# Patient Record
Sex: Male | Born: 1960 | Race: White | Hispanic: No | Marital: Married | Smoking: Former smoker
Health system: Southern US, Community
[De-identification: ages and names within clinical notes are randomized; demographics above are authoritative.]

## PROBLEM LIST (undated history)

## (undated) DIAGNOSIS — F32A Depression, unspecified: Secondary | ICD-10-CM

## (undated) DIAGNOSIS — G4733 Obstructive sleep apnea (adult) (pediatric): Secondary | ICD-10-CM

## (undated) DIAGNOSIS — Z8371 Family history of colonic polyps: Secondary | ICD-10-CM

## (undated) DIAGNOSIS — M1990 Unspecified osteoarthritis, unspecified site: Secondary | ICD-10-CM

## (undated) DIAGNOSIS — M25532 Pain in left wrist: Secondary | ICD-10-CM

## (undated) DIAGNOSIS — I491 Atrial premature depolarization: Secondary | ICD-10-CM

## (undated) DIAGNOSIS — M542 Cervicalgia: Secondary | ICD-10-CM

## (undated) DIAGNOSIS — R5381 Other malaise: Secondary | ICD-10-CM

## (undated) DIAGNOSIS — R768 Other specified abnormal immunological findings in serum: Secondary | ICD-10-CM

## (undated) DIAGNOSIS — F329 Major depressive disorder, single episode, unspecified: Secondary | ICD-10-CM

## (undated) DIAGNOSIS — R5383 Other fatigue: Secondary | ICD-10-CM

## (undated) DIAGNOSIS — J45909 Unspecified asthma, uncomplicated: Secondary | ICD-10-CM

## (undated) DIAGNOSIS — E785 Hyperlipidemia, unspecified: Secondary | ICD-10-CM

## (undated) DIAGNOSIS — F419 Anxiety disorder, unspecified: Secondary | ICD-10-CM

## (undated) DIAGNOSIS — K449 Diaphragmatic hernia without obstruction or gangrene: Secondary | ICD-10-CM

## (undated) DIAGNOSIS — J329 Chronic sinusitis, unspecified: Secondary | ICD-10-CM

## (undated) DIAGNOSIS — R739 Hyperglycemia, unspecified: Secondary | ICD-10-CM

## (undated) DIAGNOSIS — R079 Chest pain, unspecified: Secondary | ICD-10-CM

## (undated) DIAGNOSIS — Z83719 Family history of colon polyps, unspecified: Secondary | ICD-10-CM

## (undated) DIAGNOSIS — N400 Enlarged prostate without lower urinary tract symptoms: Secondary | ICD-10-CM

## (undated) DIAGNOSIS — Z87442 Personal history of urinary calculi: Secondary | ICD-10-CM

## (undated) DIAGNOSIS — G44229 Chronic tension-type headache, not intractable: Secondary | ICD-10-CM

## (undated) HISTORY — DX: Anxiety disorder, unspecified: F41.9

## (undated) HISTORY — DX: Atrial premature depolarization: I49.1

## (undated) HISTORY — DX: Cervicalgia: M54.2

## (undated) HISTORY — DX: Other fatigue: R53.83

## (undated) HISTORY — DX: Depression, unspecified: F32.A

## (undated) HISTORY — DX: Other specified abnormal immunological findings in serum: R76.8

## (undated) HISTORY — PX: OTHER SURGICAL HISTORY: SHX169

## (undated) HISTORY — DX: Family history of colon polyps, unspecified: Z83.719

## (undated) HISTORY — DX: Obstructive sleep apnea (adult) (pediatric): G47.33

## (undated) HISTORY — DX: Hyperlipidemia, unspecified: E78.5

## (undated) HISTORY — DX: Family history of colonic polyps: Z83.71

## (undated) HISTORY — DX: Major depressive disorder, single episode, unspecified: F32.9

## (undated) HISTORY — DX: Other malaise: R53.81

## (undated) HISTORY — DX: Pain in left wrist: M25.532

## (undated) HISTORY — DX: Benign prostatic hyperplasia without lower urinary tract symptoms: N40.0

## (undated) HISTORY — DX: Chest pain, unspecified: R07.9

## (undated) HISTORY — DX: Diaphragmatic hernia without obstruction or gangrene: K44.9

## (undated) HISTORY — DX: Hyperglycemia, unspecified: R73.9

## (undated) HISTORY — DX: Chronic tension-type headache, not intractable: G44.229

---

## 1982-03-23 DIAGNOSIS — R7689 Other specified abnormal immunological findings in serum: Secondary | ICD-10-CM | POA: Insufficient documentation

## 1982-03-23 DIAGNOSIS — R768 Other specified abnormal immunological findings in serum: Secondary | ICD-10-CM

## 1982-03-23 HISTORY — DX: Other specified abnormal immunological findings in serum: R76.8

## 1998-03-04 ENCOUNTER — Emergency Department (HOSPITAL_COMMUNITY): Admission: EM | Admit: 1998-03-04 | Discharge: 1998-03-04 | Payer: Self-pay | Admitting: Emergency Medicine

## 2008-09-17 HISTORY — PX: COLON SURGERY: SHX602

## 2008-09-17 LAB — HM COLONOSCOPY

## 2009-01-30 ENCOUNTER — Encounter: Admission: RE | Admit: 2009-01-30 | Discharge: 2009-01-30 | Payer: Self-pay | Admitting: Internal Medicine

## 2009-03-23 HISTORY — PX: WISDOM TOOTH EXTRACTION: SHX21

## 2010-04-02 ENCOUNTER — Emergency Department (HOSPITAL_BASED_OUTPATIENT_CLINIC_OR_DEPARTMENT_OTHER)
Admission: EM | Admit: 2010-04-02 | Discharge: 2010-04-02 | Payer: Self-pay | Source: Home / Self Care | Admitting: Emergency Medicine

## 2010-08-11 IMAGING — CT CT CERVICAL SPINE W/O CM
4 series · 16 of 33 positions shown, 19 images · non-contrast
Comparison: None

CLINICAL DATA: Cervical pain with rotation, flexion, and extension
since 11/21/2008.  No history of trauma, surgery, or cancer.

CT CERVICAL SPINE WITHOUT CONTRAST
TECHNIQUE: Multidetector CT imaging of the cervical spine was
performed. Multiplanar CT image reconstructions were also
generated.

[Series 3: cspine · axial · 0.34mm/px · z∈[-646,-478]mm · 5 of 126 slices shown, 7 images]
[im 21/126  soft-tissue]
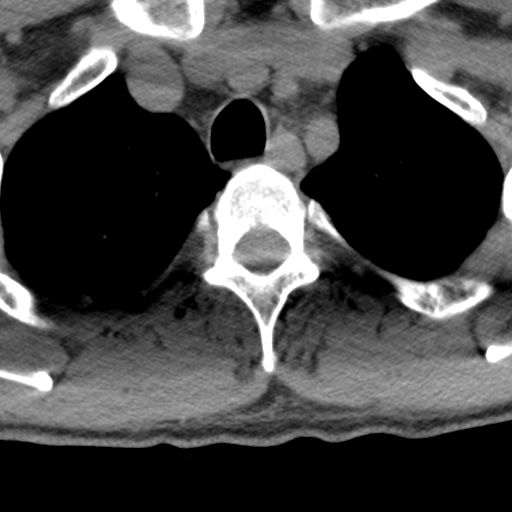
[im 21/126  bone]
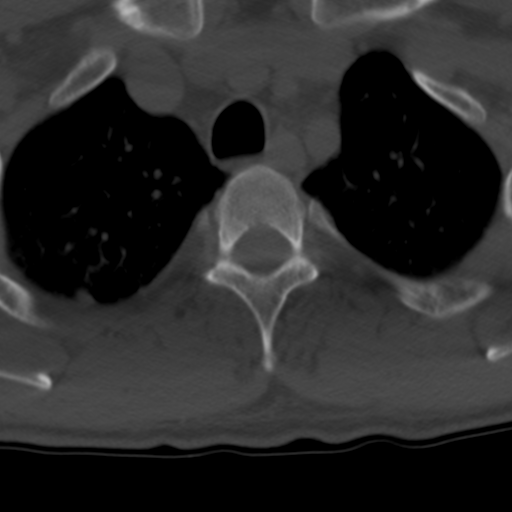
[im 42/126  bone]
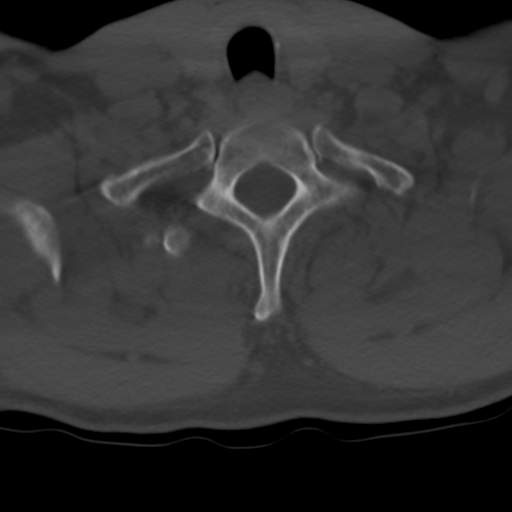
[im 63/126  bone]
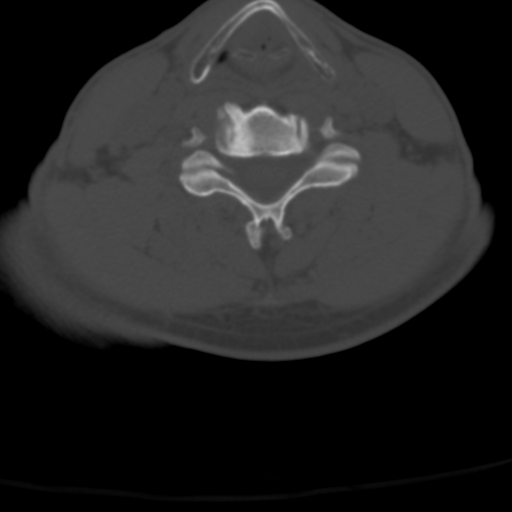
[im 84/126  bone]
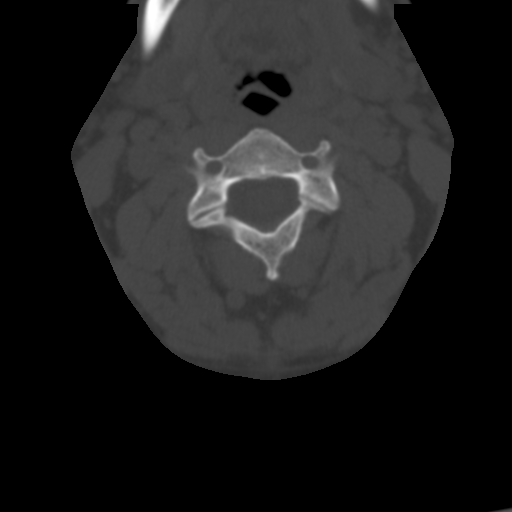
[im 105/126  soft-tissue]
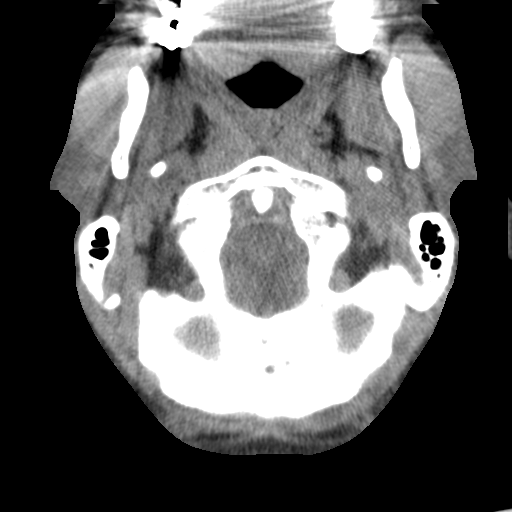
[im 105/126  bone]
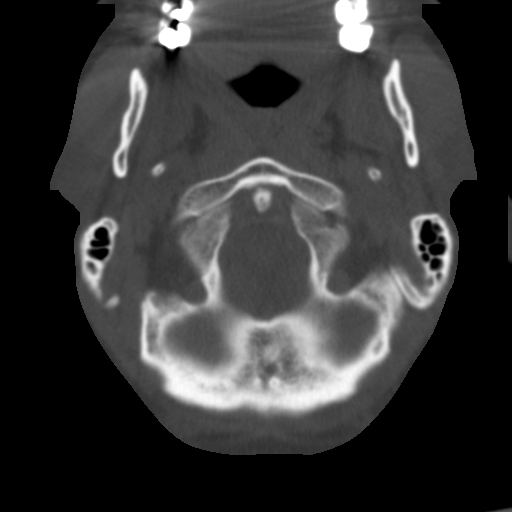

[Series 6: bone · axial · 0.34mm/px · z∈[-646,-562]mm · 3 of 126 slices shown]
[im 21/126  bone]
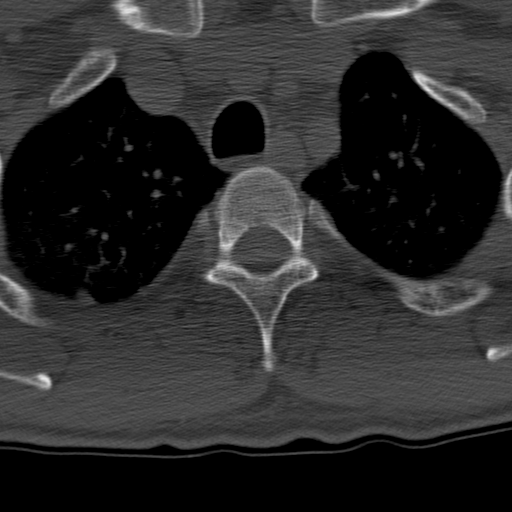
[im 42/126  bone]
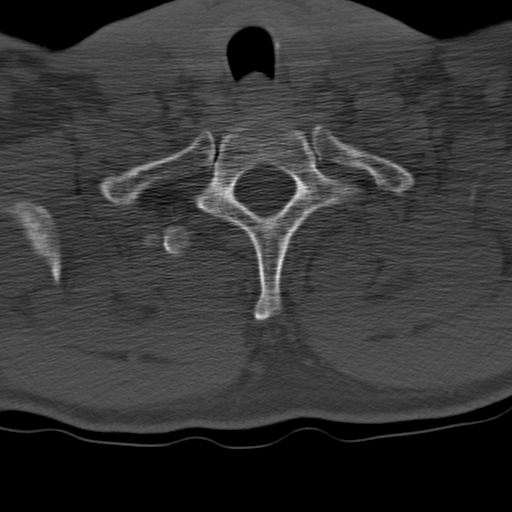
[im 63/126  bone]
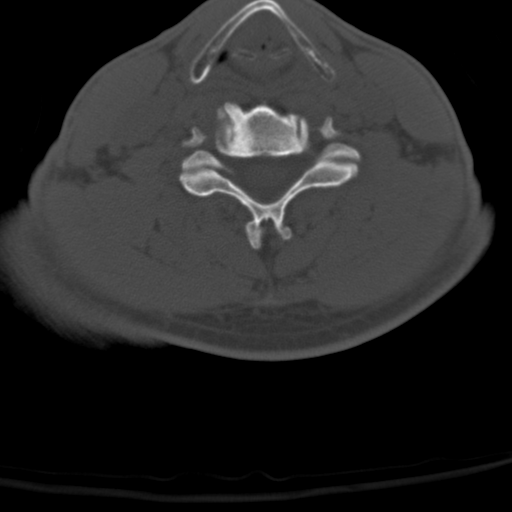

[coronals · coronal · 0.49mm/px · 3 of 53 slices shown]
[im 11/53  bone]
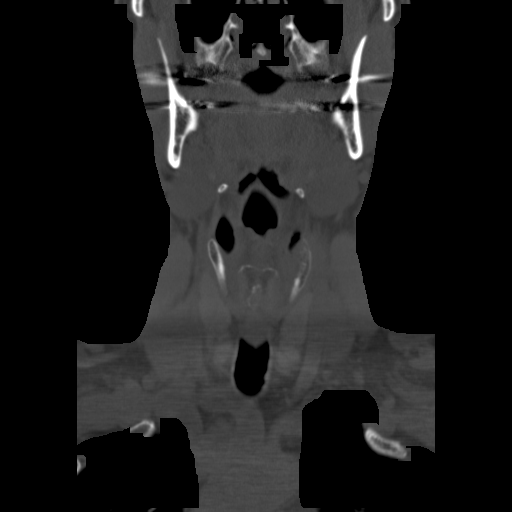
[im 21/53  bone]
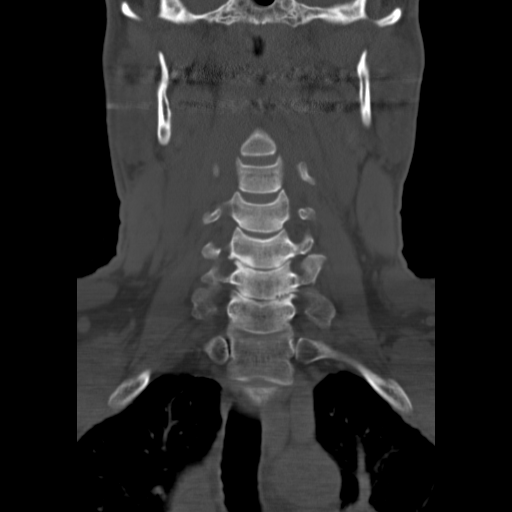
[im 32/53  bone]
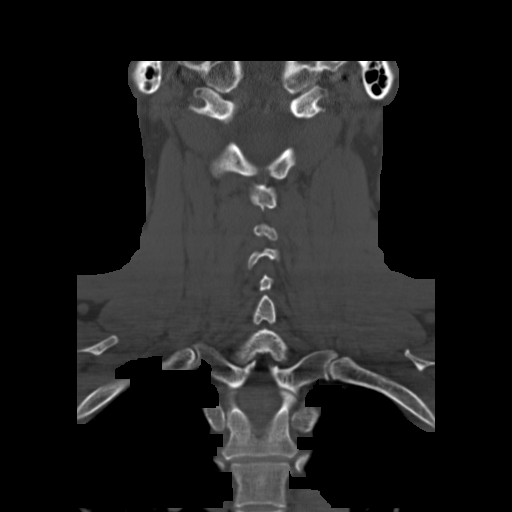

[sagittals · sagittal · 0.49mm/px · 5 of 60 slices shown, 6 images]
[im 20/60  bone]
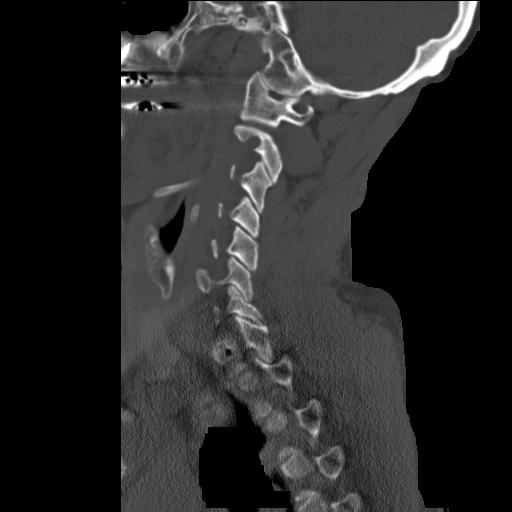
[im 25/60  bone]
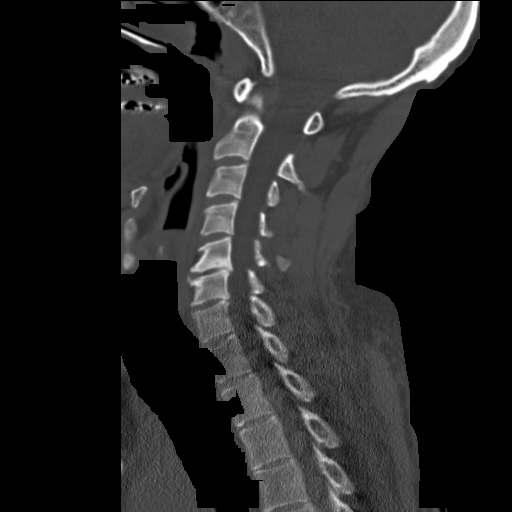
[im 30/60  soft-tissue]
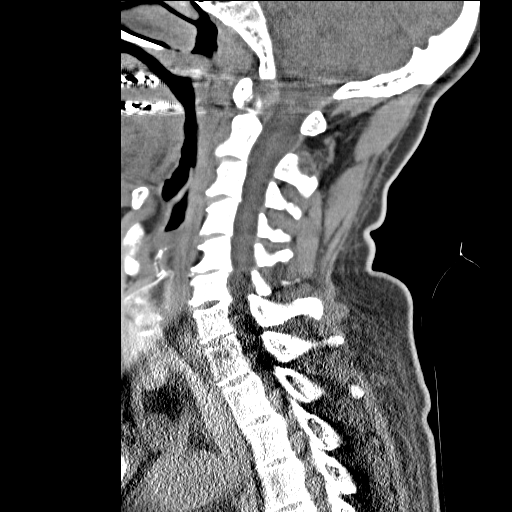
[im 30/60  bone]
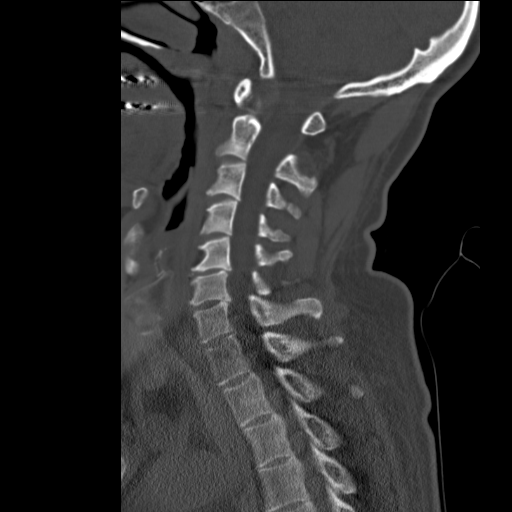
[im 35/60  bone]
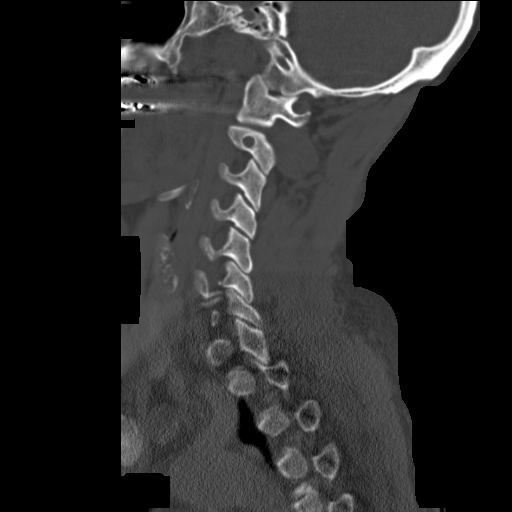
[im 40/60  bone]
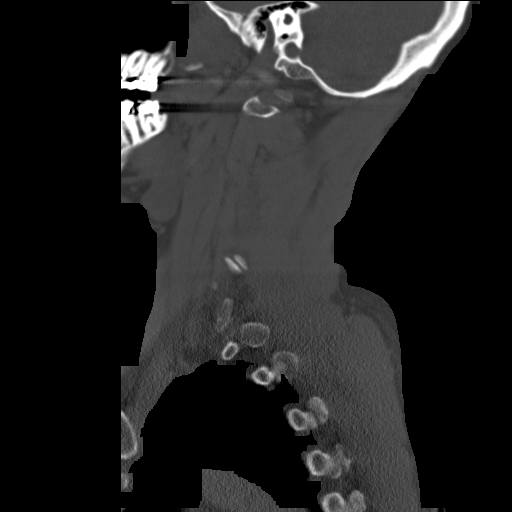

[16 of 33 positions shown; findings below may reference images not displayed]

FINDINGS: Degenerative changes are identified throughout the
cervical spine, most notable at C5-6, C6-7.  At these levels, there
are anterior osteophytes and bilateral foraminal narrowing.  There
is a 1-2 mm of retrolisthesis of C5 on C6 and T6 on C7.  There is
no evidence for acute fracture or subluxation.  Images of the lung
apices are unremarkable.  The thyroid gland has a normal CT
appearance.  There is mucoperiosteal thickening within the right
maxillary sinus.
IMPRESSION: 1.  Degenerative changes within the cervical spine.
2. No evidence for acute  abnormality.

## 2010-09-19 DIAGNOSIS — K449 Diaphragmatic hernia without obstruction or gangrene: Secondary | ICD-10-CM

## 2010-09-19 HISTORY — DX: Diaphragmatic hernia without obstruction or gangrene: K44.9

## 2011-10-04 ENCOUNTER — Emergency Department (HOSPITAL_BASED_OUTPATIENT_CLINIC_OR_DEPARTMENT_OTHER)
Admission: EM | Admit: 2011-10-04 | Discharge: 2011-10-04 | Disposition: A | Discharge status: Home / Self Care | Payer: 59 | Attending: Emergency Medicine | Admitting: Emergency Medicine

## 2011-10-04 ENCOUNTER — Encounter (HOSPITAL_BASED_OUTPATIENT_CLINIC_OR_DEPARTMENT_OTHER): Payer: Self-pay | Admitting: *Deleted

## 2011-10-04 DIAGNOSIS — T63461A Toxic effect of venom of wasps, accidental (unintentional), initial encounter: Secondary | ICD-10-CM | POA: Insufficient documentation

## 2011-10-04 DIAGNOSIS — J45909 Unspecified asthma, uncomplicated: Secondary | ICD-10-CM | POA: Insufficient documentation

## 2011-10-04 DIAGNOSIS — T63441A Toxic effect of venom of bees, accidental (unintentional), initial encounter: Secondary | ICD-10-CM

## 2011-10-04 DIAGNOSIS — R0602 Shortness of breath: Secondary | ICD-10-CM | POA: Insufficient documentation

## 2011-10-04 DIAGNOSIS — T6391XA Toxic effect of contact with unspecified venomous animal, accidental (unintentional), initial encounter: Secondary | ICD-10-CM | POA: Insufficient documentation

## 2011-10-04 DIAGNOSIS — L500 Allergic urticaria: Secondary | ICD-10-CM | POA: Insufficient documentation

## 2011-10-04 HISTORY — DX: Unspecified asthma, uncomplicated: J45.909

## 2011-10-04 MED ORDER — EPINEPHRINE 0.3 MG/0.3ML IJ DEVI
0.3000 mg | Freq: Once | INTRAMUSCULAR | Status: AC
  Administered 2011-10-04: 0.3 mg via INTRAMUSCULAR

## 2011-10-04 MED ORDER — DIPHENHYDRAMINE HCL 50 MG/ML IJ SOLN
INTRAMUSCULAR | Status: AC
  Administered 2011-10-04: 25 mg via INTRAVENOUS
  Filled 2011-10-04: qty 1

## 2011-10-04 MED ORDER — METHYLPREDNISOLONE SODIUM SUCC 125 MG IJ SOLR
125.0000 mg | Freq: Once | INTRAMUSCULAR | Status: AC
  Administered 2011-10-04: 125 mg via INTRAVENOUS

## 2011-10-04 MED ORDER — METHYLPREDNISOLONE SODIUM SUCC 125 MG IJ SOLR
INTRAMUSCULAR | Status: AC
  Administered 2011-10-04: 125 mg via INTRAVENOUS
  Filled 2011-10-04: qty 2

## 2011-10-04 MED ORDER — PREDNISONE 10 MG PO TABS: 20.0000 mg | ORAL_TABLET | Freq: Two times a day (BID) | ORAL | Status: DC

## 2011-10-04 MED ORDER — EPINEPHRINE 0.3 MG/0.3ML IJ DEVI: 0.3000 mg | INTRAMUSCULAR | Status: DC | PRN

## 2011-10-04 MED ORDER — EPINEPHRINE 0.3 MG/0.3ML IJ DEVI
INTRAMUSCULAR | Status: AC
  Administered 2011-10-04: 0.3 mg via INTRAMUSCULAR
  Filled 2011-10-04: qty 0.3

## 2011-10-04 MED ORDER — DIPHENHYDRAMINE HCL 50 MG/ML IJ SOLN
25.0000 mg | Freq: Once | INTRAMUSCULAR | Status: AC
  Administered 2011-10-04: 25 mg via INTRAVENOUS

## 2011-10-04 NOTE — ED Notes (Signed)
Pt ststaes he is having an allergic reaction to a beesting. Feels like throat is closing. Took Equate x 1 PTA. Taken to [facility]. IV being initiated.

## 2011-10-04 NOTE — ED Provider Notes (Signed)
History     CSN: [id number]  Arrival date & time 10/04/11  1351   First MD Initiated Contact with Patient 10/04/11 1359      Chief Complaint  Patient presents with  . Allergic Reaction    (Consider location/radiation/quality/duration/timing/severity/associated sxs/prior treatment) HPI Comments: Stung by multiple yellow jackets, now having swelling to the mouth, face, arms, legs.  Feels strange sensation in the throat.  Patient is a 51 y.o. male presenting with allergic reaction. The history is provided by the patient.  Allergic Reaction The primary symptoms are  wheezing, shortness of breath and urticaria. Primary symptoms comment: throat swelling The current episode started less than 1 hour ago. The problem has been rapidly worsening.  The onset of the reaction was associated with insect bite/sting.    Past Medical History  Diagnosis Date  . Asthma     History reviewed. No pertinent past surgical history.  History reviewed. No pertinent family history.  History  Substance Use Topics  . Smoking status: Never Smoker   . Smokeless tobacco: Not on file  . Alcohol Use: No      Review of Systems  Respiratory: Positive for shortness of breath and wheezing.   All other systems reviewed and are negative.    Allergies  Review of patient's allergies indicates no known allergies.  Home Medications  No current outpatient prescriptions on file.  BP 145/90  Pulse 110  Temp 97.6 F (36.4 C) (Oral)  Resp 24  Ht 6' (1.829 m)  Wt 179 lb (81.194 kg)  BMI 24.28 kg/m2  SpO2 95%  Physical Exam  Nursing note and vitals reviewed. Constitutional: He is oriented to person, place, and time. He appears well-developed and well-nourished. No distress.  HENT:  Head: Normocephalic and atraumatic.  Mouth/Throat: Oropharynx is clear and moist.  Neck: Normal range of motion. Neck supple.  Cardiovascular: Normal rate and regular rhythm.   No murmur heard. Pulmonary/Chest: Breath  sounds normal. No respiratory distress. He has no wheezes.  Abdominal: Soft. Bowel sounds are normal. He exhibits no distension. There is no tenderness.  Musculoskeletal: Normal range of motion.  Neurological: He is alert and oriented to person, place, and time.  Skin: He is not diaphoretic.       There are diffuse urticarial lesions to the arms, legs, and torso.    ED Course  Procedures (including critical care time)  Labs Reviewed - No data to display No results found.   No diagnosis found.    MDM  The patient presents with an anaphylactic reaction to bee stings.  He was given solumedrol, benadryl, and epipen and his symptoms have resolved.  He is feeling much better and appears stable for discharge.  I will continue prednisone and benadryl for the next two days and prescribe an epipen for him to keep on hand.        Geoffery Lyons, MD 10/04/11 [phone]

## 2012-01-22 DIAGNOSIS — G44229 Chronic tension-type headache, not intractable: Secondary | ICD-10-CM

## 2012-01-22 HISTORY — DX: Chronic tension-type headache, not intractable: G44.229

## 2012-06-13 ENCOUNTER — Other Ambulatory Visit: Payer: Self-pay | Admitting: Geriatric Medicine

## 2012-06-13 ENCOUNTER — Other Ambulatory Visit: Payer: Self-pay | Admitting: Internal Medicine

## 2012-06-14 ENCOUNTER — Other Ambulatory Visit: Payer: Self-pay | Admitting: Geriatric Medicine

## 2012-06-14 MED ORDER — HYDROCODONE-ACETAMINOPHEN 7.5-325 MG PO TABS: ORAL_TABLET | ORAL | Status: DC

## 2012-06-21 ENCOUNTER — Other Ambulatory Visit: Payer: Self-pay | Admitting: Internal Medicine

## 2012-07-15 ENCOUNTER — Other Ambulatory Visit: Payer: Self-pay | Admitting: Internal Medicine

## 2012-07-21 ENCOUNTER — Encounter: Payer: Self-pay | Admitting: Internal Medicine

## 2012-07-29 ENCOUNTER — Other Ambulatory Visit: Payer: Self-pay | Admitting: *Deleted

## 2012-07-29 MED ORDER — HYDROCODONE-ACETAMINOPHEN 7.5-325 MG PO TABS: ORAL_TABLET | ORAL | Status: DC

## 2012-08-09 ENCOUNTER — Other Ambulatory Visit: Payer: Self-pay | Admitting: Internal Medicine

## 2012-08-09 MED ORDER — LORAZEPAM 1 MG PO TABS: ORAL_TABLET | ORAL | Status: DC

## 2012-08-31 ENCOUNTER — Other Ambulatory Visit: Payer: Self-pay | Admitting: Nurse Practitioner

## 2012-09-13 ENCOUNTER — Other Ambulatory Visit: Payer: Self-pay | Admitting: Nurse Practitioner

## 2012-09-13 ENCOUNTER — Other Ambulatory Visit: Payer: Self-pay | Admitting: *Deleted

## 2012-09-29 ENCOUNTER — Other Ambulatory Visit: Payer: Self-pay

## 2012-11-30 ENCOUNTER — Other Ambulatory Visit: Payer: Self-pay | Admitting: Internal Medicine

## 2012-12-30 ENCOUNTER — Other Ambulatory Visit: Payer: Self-pay

## 2012-12-30 MED ORDER — HYDROCODONE-ACETAMINOPHEN 7.5-325 MG PO TABS: ORAL_TABLET | ORAL | Status: DC

## 2013-01-31 ENCOUNTER — Other Ambulatory Visit: Payer: Self-pay | Admitting: *Deleted

## 2013-01-31 MED ORDER — HYDROCODONE-ACETAMINOPHEN 7.5-325 MG PO TABS: ORAL_TABLET | ORAL | Status: DC

## 2013-02-02 ENCOUNTER — Other Ambulatory Visit: Payer: Self-pay | Admitting: *Deleted

## 2013-03-01 ENCOUNTER — Other Ambulatory Visit: Payer: Self-pay | Admitting: *Deleted

## 2013-03-01 MED ORDER — HYDROCODONE-ACETAMINOPHEN 7.5-325 MG PO TABS: ORAL_TABLET | ORAL | Status: DC

## 2013-03-01 MED ORDER — LORAZEPAM 1 MG PO TABS: ORAL_TABLET | ORAL | Status: DC

## 2013-04-03 ENCOUNTER — Other Ambulatory Visit: Payer: Self-pay | Admitting: *Deleted

## 2013-04-03 MED ORDER — HYDROCODONE-ACETAMINOPHEN 7.5-325 MG PO TABS: ORAL_TABLET | ORAL | Status: DC

## 2013-04-26 DIAGNOSIS — Z0289 Encounter for other administrative examinations: Secondary | ICD-10-CM

## 2013-05-02 ENCOUNTER — Other Ambulatory Visit: Payer: Self-pay | Admitting: *Deleted

## 2013-05-02 MED ORDER — HYDROCODONE-ACETAMINOPHEN 7.5-325 MG PO TABS: ORAL_TABLET | ORAL | Status: DC

## 2013-05-02 MED ORDER — LORAZEPAM 1 MG PO TABS: ORAL_TABLET | ORAL | Status: DC

## 2013-05-31 ENCOUNTER — Other Ambulatory Visit: Payer: Self-pay | Admitting: *Deleted

## 2013-05-31 MED ORDER — HYDROCODONE-ACETAMINOPHEN 7.5-325 MG PO TABS: ORAL_TABLET | ORAL | Status: DC

## 2013-06-01 ENCOUNTER — Other Ambulatory Visit: Payer: Self-pay | Admitting: Internal Medicine

## 2013-06-01 NOTE — Telephone Encounter (Signed)
Left message on VM informing patient OV due

## 2013-06-06 ENCOUNTER — Encounter: Payer: Self-pay | Admitting: *Deleted

## 2013-06-07 ENCOUNTER — Other Ambulatory Visit: Payer: Self-pay | Admitting: *Deleted

## 2013-06-07 ENCOUNTER — Ambulatory Visit (INDEPENDENT_AMBULATORY_CARE_PROVIDER_SITE_OTHER): Payer: 59 | Admitting: Internal Medicine

## 2013-06-07 ENCOUNTER — Other Ambulatory Visit: Payer: 59

## 2013-06-07 ENCOUNTER — Encounter: Payer: Self-pay | Admitting: Internal Medicine

## 2013-06-07 VITALS — BP 122/78 | HR 94 | Temp 97.9°F | Resp 20 | Ht 72.0 in | Wt 187.0 lb

## 2013-06-07 DIAGNOSIS — M25539 Pain in unspecified wrist: Secondary | ICD-10-CM

## 2013-06-07 DIAGNOSIS — N400 Enlarged prostate without lower urinary tract symptoms: Secondary | ICD-10-CM

## 2013-06-07 DIAGNOSIS — R7309 Other abnormal glucose: Secondary | ICD-10-CM

## 2013-06-07 DIAGNOSIS — E785 Hyperlipidemia, unspecified: Secondary | ICD-10-CM

## 2013-06-07 DIAGNOSIS — I491 Atrial premature depolarization: Secondary | ICD-10-CM

## 2013-06-07 DIAGNOSIS — R5381 Other malaise: Secondary | ICD-10-CM

## 2013-06-07 DIAGNOSIS — M25532 Pain in left wrist: Secondary | ICD-10-CM

## 2013-06-07 DIAGNOSIS — R739 Hyperglycemia, unspecified: Secondary | ICD-10-CM

## 2013-06-07 DIAGNOSIS — Z0000 Encounter for general adult medical examination without abnormal findings: Secondary | ICD-10-CM

## 2013-06-07 DIAGNOSIS — M542 Cervicalgia: Secondary | ICD-10-CM

## 2013-06-07 DIAGNOSIS — G4733 Obstructive sleep apnea (adult) (pediatric): Secondary | ICD-10-CM

## 2013-06-07 DIAGNOSIS — R5383 Other fatigue: Secondary | ICD-10-CM

## 2013-06-07 DIAGNOSIS — K449 Diaphragmatic hernia without obstruction or gangrene: Secondary | ICD-10-CM

## 2013-06-07 MED ORDER — HYDROCODONE-ACETAMINOPHEN 7.5-325 MG PO TABS: ORAL_TABLET | ORAL | Status: DC

## 2013-06-07 NOTE — Progress Notes (Signed)
Patient ID: James [name], male   DOB: 1960/03/30, 53 y.o.   MRN: [id number]    Location:    [facility]  Place of Service:  OFFICE PCP: Kimber RelicGREEN, Elmer Merwin G, MD  Code Status: Living will  Extended Emergency Contact Information Primary Emergency Contact[name] Home Phone: [phone] Relation: Other  Allergies  Allergen Reactions  . Augmentin [Amoxicillin-Pot Clavulanate]   . Sedapap [Butalbital-Acetaminophen]   . Skelaxin [Metaxalone]     Chief Complaint  Patient presents with  . Medical Managment of Chronic Issues    general exam    HPI:  Patient is feeling well at this time. He presents today for a complete exam and followup of several medical issues.  Hyperlipidemia: Patient has a high total cholesterol and a moderately high LDL. This is offset by a high HDL.  BPH (benign prostatic hyperplasia): Previously had obstructive symptoms with diminished flow at presentation. He was tried on tamsulosin and then finasteride. Could not tolerate either of these drugs. He now says that history and is better. He is not on any medications for BPH.  Cervicalgia: Chronic and persistent. He has known degenerative changes in the cervical spine on previous CT scan of the cervical spine. There are also foraminal stenoses.  Other malaise and fatigue: Generally doing well and with more energy. He finds his work difficult.  Hyperglycemia: Very modest elevations in the serum glucose in the past.  Obstructive sleep apnea (adult) (pediatric): Wearing CPAP nightly.  Supraventricular premature beats: Noted in 2014. No recent activity.  Wrist pain, left: Patient feels his having work related problems that cause left wrist pain with some radiation up the left arm. 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 He describes the pain as somewhat sharp at times.  Hiatal hernia: Asymptomatic. He had EGD in 2012 that showed some changes in the esophagus suggestive of  Barrett's esophagus as well as his hiatal hernia.     Past Medical History  Diagnosis Date  . Asthma   . Hyperlipidemia   . Anxiety   . Headache, tension type, chronic 01/2012  . Depression   . BPH (benign prostatic hyperplasia)   . Cervicalgia     degenerative changes and foraminal stenoses  . Other malaise and fatigue   . Chest pain, unspecified   . Hyperglycemia     104 on 10/10/10  . Obstructive sleep apnea (adult) (pediatric)     using CPAP  . Supraventricular premature beats     noted on EKG 05/18/12  . Wrist pain, left   . Family history of colonic polyps     Father with benign colon polyp  . Hiatal hernia 09/19/10     Past Surgical History  Procedure Laterality Date  . Wisdom tooth extraction  03/2009  . Colon surgery  09/17/2008    poylps/hemorriods    CON[facility]  PAST PROCEDURES 1996-CT Abdomen and Pelvis:Normal 1996-IVP:Normal 09/17/2008 Colonoscopy -4mm polyp, hemorrhoids 09/19/2010 Endoscopy Esophageal mucosal changes suspicious for short segment Barrett's esophagus, biopsied, hiatus hernia. 01/30/2009 CT sine degenerative changes within the cervical spine 08/23/2009 Sleep stud-moderate obstructive sleep apnea, CPAP  Social History: History   Social History  . Marital Status: Married    Spouse Name: N/A    Number of Children: N/A  . Years of Education: N/A   Social History Main Topics  . Smoking status: Never Smoker   . Smokeless tobacco: None  . Alcohol Use: No  .  Drug Use: No  . Sexual Activity: None   Other Topics Concern  . None   Social History Narrative  . None    Family History Family Status  Relation Status Death Age  . Mother Alive   . Father Deceased   . Sister Alive   . Brother Alive    Family History  Problem Relation Age of Onset  . Heart disease Mother   . Hypertension Mother   . Diabetes Mother   . Depression Mother   . Cancer Father     2004 bladder cancer      Medications: Patient's Medications  New Prescriptions   No medications on file  Previous Medications   EPINEPHRINE (EPIPEN) 0.3 MG/0.3 ML DEVI    Inject 0.3 mLs (0.3 mg total) into the muscle as needed.   HYDROCODONE-ACETAMINOPHEN (NORCO) 7.5-325 MG PER TABLET    Take one tablet up to four times daily as needed for pain   LORAZEPAM (ATIVAN) 1 MG TABLET    TAKE 1 TABLET BY MOUTH TWICE DAILY AS NEEDED ANXIETY  Modified Medications   No medications on file  Discontinued Medications   FINASTERIDE (PROSCAR) 5 MG TABLET    TAKE ONE TABLET BY MOUTH ONE TIME DAILY   PREDNISONE (DELTASONE) 10 MG TABLET    Take 2 tablets (20 mg total) by mouth 2 (two) times daily.    Immunization History  Administered Date(s) Administered  . Influenza-Unspecified 12/16/2009, 12/17/2010     Review of Systems  Constitutional: Positive for fatigue. Negative for fever, diaphoresis, activity change, appetite change and unexpected weight change.  HENT: Negative.   Eyes: Negative.   Respiratory: Negative.   Cardiovascular: Negative for chest pain, palpitations and leg swelling.  Gastrointestinal: Negative.  Negative for nausea, abdominal pain, diarrhea, constipation and abdominal distention.  Endocrine: Negative.   Genitourinary: Negative for dysuria, hematuria, flank pain and difficulty urinating.       History of BPH  Musculoskeletal:       Discomfort in the left wrist that radiates up the left arm.  Skin: Negative.   Allergic/Immunologic: Negative.   Neurological: Negative.   Hematological: Negative.   Psychiatric/Behavioral: Negative.       Filed Vitals:   06/07/13 1220  BP: 122/78  Pulse: 94  Temp: 97.9 F (36.6 C)  TempSrc: Oral  Resp: 20  Height: 6' (1.829 m)  Weight: 187 lb (84.823 kg)  SpO2: 98%   Physical Exam  Constitutional: He appears well-developed and well-nourished. No distress.  HENT:  Right Ear: External ear normal.  Left Ear: External ear normal.  Nose: Nose  normal.  Mouth/Throat: Oropharynx is clear and moist. No oropharyngeal exudate.  Eyes: Conjunctivae and EOM are normal. Pupils are equal, round, and reactive to light.  Neck: No JVD present. No tracheal deviation present. No thyromegaly present.  Cardiovascular: Normal rate, regular rhythm, normal heart sounds and intact distal pulses.  Exam reveals no gallop and no friction rub.   No murmur heard. Respiratory: No respiratory distress. He has no wheezes. He has no rales. He exhibits no tenderness.  GI: He exhibits no distension and no mass. There is no tenderness.  Genitourinary: Penis normal.  Musculoskeletal: Normal range of motion. He exhibits no edema and no tenderness.  Tender left wrist radial side and metacarpal phalangeal joint of the thumb  Lymphadenopathy:    He has no cervical adenopathy.  Neurological: He is alert. He has normal reflexes. No cranial nerve deficit. Coordination normal.  Skin: No rash noted.  No erythema. No pallor.  Psychiatric: He has a normal mood and affect. His behavior is normal. Judgment and thought content normal.       Labs reviewed: Office Visit on 06/07/2013  Component Date Value Ref Range Status  . PSA 06/07/2013 0.7  0.0 - 4.0 ng/mL Final   Comment: Roche ECLIA methodology.                          According to the American Urological Association, Serum PSA should                          decrease and remain at undetectable levels after radical                          prostatectomy. The AUA defines biochemical recurrence as an initial                          PSA value 0.2 ng/mL or greater followed by a subsequent confirmatory                          PSA value 0.2 ng/mL or greater.                          Values obtained with different assay methods or kits cannot be used                          interchangeably. Results cannot be interpreted as absolute evidence                          of the presence or absence of malignant disease.  .  WBC 06/07/2013 6.0  3.4 - 10.8 x10E3/uL Final  . RBC 06/07/2013 5.00  4.14 - 5.80 x10E6/uL Final  . Hemoglobin 06/07/2013 14.0  12.6 - 17.7 g/dL Final  . HCT 16/12/9602 42.7  37.5 - 51.0 % Final  . MCV 06/07/2013 85  79 - 97 fL Final  . MCH 06/07/2013 28.0  26.6 - 33.0 pg Final  . MCHC 06/07/2013 32.8  31.5 - 35.7 g/dL Final  . RDW 54/11/8117 14.1  12.3 - 15.4 % Final  . Neutrophils Relative % 06/07/2013 65   Final  . Lymphs 06/07/2013 24   Final  . Monocytes 06/07/2013 7   Final  . Eos 06/07/2013 4   Final  . Basos 06/07/2013 0   Final  . Neutrophils Absolute 06/07/2013 3.9  1.4 - 7.0 x10E3/uL Final  . Lymphocytes Absolute 06/07/2013 1.4  0.7 - 3.1 x10E3/uL Final  . Monocytes Absolute 06/07/2013 0.4  0.1 - 0.9 x10E3/uL Final  . Eosinophils Absolute 06/07/2013 0.2  0.0 - 0.4 x10E3/uL Final  . Basophils Absolute 06/07/2013 0.0  0.0 - 0.2 x10E3/uL Final  . Immature Granulocytes 06/07/2013 0   Final  . Immature Grans (Abs) 06/07/2013 0.0  0.0 - 0.1 x10E3/uL Final  . Glucose 06/07/2013 99  65 - 99 mg/dL Final  . BUN 14/78/2956 13  6 - 24 mg/dL Final  . Creatinine, Ser 06/07/2013 0.96  0.76 - 1.27 mg/dL Final  . GFR calc non Af Amer 06/07/2013 89  >59 mL/min/1.73 Final  . GFR calc Af Amer 06/07/2013 102  >59 mL/min/1.73 Final  .  BUN/Creatinine Ratio 06/07/2013 14  9 - 20 Final  . Sodium 06/07/2013 139  134 - 144 mmol/L Final  . Potassium 06/07/2013 4.6  3.5 - 5.2 mmol/L Final  . Chloride 06/07/2013 103  97 - 108 mmol/L Final  . CO2 06/07/2013 22  18 - 29 mmol/L Final  . Calcium 06/07/2013 9.5  8.7 - 10.2 mg/dL Final  . Cholesterol, Total 06/07/2013 228* 100 - 199 mg/dL Final  . Triglycerides 06/07/2013 75  0 - 149 mg/dL Final  . HDL 16/12/9602 72  >39 mg/dL Final   Comment: According to ATP-III Guidelines, HDL-C >59 mg/dL is considered a                          negative risk factor for CHD.  Marland Kitchen VLDL Cholesterol Cal 06/07/2013 15  5 - 40 mg/dL Final  . LDL Calculated 06/07/2013 540* 0  - 99 mg/dL Final  . Chol/HDL Ratio 06/07/2013 3.2  0.0 - 5.0 ratio units Final   Comment:                                   T. Chol/HDL Ratio                                                                      Men  Women                                                        1/2 Avg.Risk  3.4    3.3                                                            Avg.Risk  5.0    4.4                                                         2X Avg.Risk  9.6    7.1                                                         3X Avg.Risk 23.4   11.0  Abstract on 06/06/2013  Component Date Value Ref Range Status  . HM Colonoscopy 09/17/2008 [facility], Dr Bosie Clos,  poylp f/u ? 3-5 yrs.   Final     Assessment/Plan 1. Physical exam, annual - CBC with Differential - Basic Metabolic Panel - Lipid Panel - PSA   2. Hyperlipidemia LDL 140  3. BPH (benign prostatic hyperplasia) Improved symptoms  4. Cervicalgia Mild and chronic  5. Other malaise and fatigue improved  6. Hyperglycemia Normal   7. Obstructive sleep apnea (adult) (pediatric) continue CPAP  8. Supraventricular premature beats resolved  9. Wrist pain, left Get ortho consult if problem gets any worse  10. Hiatal hernia asymptomatic

## 2013-06-07 NOTE — Patient Instructions (Signed)
Continue current medications. 

## 2013-06-08 ENCOUNTER — Encounter: Payer: Self-pay | Admitting: Internal Medicine

## 2013-06-08 DIAGNOSIS — I491 Atrial premature depolarization: Secondary | ICD-10-CM | POA: Insufficient documentation

## 2013-06-08 DIAGNOSIS — G4733 Obstructive sleep apnea (adult) (pediatric): Secondary | ICD-10-CM | POA: Insufficient documentation

## 2013-06-08 DIAGNOSIS — R5383 Other fatigue: Secondary | ICD-10-CM | POA: Insufficient documentation

## 2013-06-08 DIAGNOSIS — R739 Hyperglycemia, unspecified: Secondary | ICD-10-CM | POA: Insufficient documentation

## 2013-06-08 DIAGNOSIS — N400 Enlarged prostate without lower urinary tract symptoms: Secondary | ICD-10-CM | POA: Insufficient documentation

## 2013-06-08 DIAGNOSIS — M25532 Pain in left wrist: Secondary | ICD-10-CM | POA: Insufficient documentation

## 2013-06-08 DIAGNOSIS — M542 Cervicalgia: Secondary | ICD-10-CM | POA: Insufficient documentation

## 2013-06-08 DIAGNOSIS — E785 Hyperlipidemia, unspecified: Secondary | ICD-10-CM | POA: Insufficient documentation

## 2013-06-08 LAB — CBC WITH DIFFERENTIAL/PLATELET
BASOS ABS: 0 10*3/uL (ref 0.0–0.2)
Basos: 0 %
Eos: 4 %
Eosinophils Absolute: 0.2 10*3/uL (ref 0.0–0.4)
HEMATOCRIT: 42.7 % (ref 37.5–51.0)
Hemoglobin: 14 g/dL (ref 12.6–17.7)
Immature Grans (Abs): 0 10*3/uL (ref 0.0–0.1)
Immature Granulocytes: 0 %
LYMPHS ABS: 1.4 10*3/uL (ref 0.7–3.1)
Lymphs: 24 %
MCH: 28 pg (ref 26.6–33.0)
MCHC: 32.8 g/dL (ref 31.5–35.7)
MCV: 85 fL (ref 79–97)
MONOCYTES: 7 %
MONOS ABS: 0.4 10*3/uL (ref 0.1–0.9)
NEUTROS PCT: 65 %
Neutrophils Absolute: 3.9 10*3/uL (ref 1.4–7.0)
RBC: 5 x10E6/uL (ref 4.14–5.80)
RDW: 14.1 % (ref 12.3–15.4)
WBC: 6 10*3/uL (ref 3.4–10.8)

## 2013-06-08 LAB — BASIC METABOLIC PANEL
BUN / CREAT RATIO: 14 (ref 9–20)
BUN: 13 mg/dL (ref 6–24)
CHLORIDE: 103 mmol/L (ref 97–108)
CO2: 22 mmol/L (ref 18–29)
Calcium: 9.5 mg/dL (ref 8.7–10.2)
Creatinine, Ser: 0.96 mg/dL (ref 0.76–1.27)
GFR calc Af Amer: 102 mL/min/{1.73_m2} (ref >59)
GFR, EST NON AFRICAN AMERICAN: 89 mL/min/{1.73_m2} (ref >59)
Glucose: 99 mg/dL (ref 65–99)
POTASSIUM: 4.6 mmol/L (ref 3.5–5.2)
Sodium: 139 mmol/L (ref 134–144)

## 2013-06-08 LAB — LIPID PANEL
CHOL/HDL RATIO: 3.2 ratio (ref 0.0–5.0)
Cholesterol, Total: 228 mg/dL — ABNORMAL HIGH (ref 100–199)
HDL: 72 mg/dL (ref >39)
LDL CALC: 141 mg/dL — AB (ref 0–99)
TRIGLYCERIDES: 75 mg/dL (ref 0–149)
VLDL Cholesterol Cal: 15 mg/dL (ref 5–40)

## 2013-06-08 LAB — PSA: PSA: 0.7 ng/mL (ref 0.0–4.0)

## 2013-07-27 ENCOUNTER — Telehealth: Payer: Self-pay | Admitting: *Deleted

## 2013-07-27 ENCOUNTER — Other Ambulatory Visit: Payer: Self-pay | Admitting: *Deleted

## 2013-07-27 MED ORDER — LORAZEPAM 1 MG PO TABS: ORAL_TABLET | ORAL | Status: DC

## 2013-07-27 MED ORDER — HYDROCODONE-ACETAMINOPHEN 7.5-325 MG PO TABS: ORAL_TABLET | ORAL | Status: DC

## 2013-07-27 MED ORDER — FINASTERIDE 5 MG PO TABS: ORAL_TABLET | ORAL | Status: DC

## 2013-07-27 NOTE — Telephone Encounter (Signed)
Call patient. It is okay to resume finasteride 5 mg daily Rx: Finasteride 5 mg (30, 5 refills): One daily to help prostate.

## 2013-07-27 NOTE — Telephone Encounter (Signed)
Faxed Rx into pharmacy and patient Notified. 

## 2013-07-27 NOTE — Telephone Encounter (Signed)
Patient called and wanted to know if he could go back on the Finasteride. Stated that he was having problems with his prostate with the urine stream and stopping and weak. Stated that he took this before and it is listed in the old system. Please Advise.

## 2013-08-24 ENCOUNTER — Other Ambulatory Visit: Payer: Self-pay | Admitting: *Deleted

## 2013-08-24 MED ORDER — HYDROCODONE-ACETAMINOPHEN 7.5-325 MG PO TABS: ORAL_TABLET | ORAL | Status: DC

## 2013-09-20 ENCOUNTER — Other Ambulatory Visit: Payer: Self-pay | Admitting: *Deleted

## 2013-09-20 MED ORDER — HYDROCODONE-ACETAMINOPHEN 7.5-325 MG PO TABS: ORAL_TABLET | ORAL | Status: DC

## 2013-09-21 ENCOUNTER — Other Ambulatory Visit: Payer: Self-pay | Admitting: Internal Medicine

## 2013-10-18 ENCOUNTER — Other Ambulatory Visit: Payer: Self-pay | Admitting: *Deleted

## 2013-10-18 MED ORDER — HYDROCODONE-ACETAMINOPHEN 7.5-325 MG PO TABS: ORAL_TABLET | ORAL | Status: DC

## 2013-10-18 NOTE — Telephone Encounter (Signed)
Patient requested Rx. Patient is going out of town

## 2013-11-06 ENCOUNTER — Ambulatory Visit (INDEPENDENT_AMBULATORY_CARE_PROVIDER_SITE_OTHER): Payer: 59 | Admitting: Neurology

## 2013-11-06 ENCOUNTER — Encounter: Payer: Self-pay | Admitting: Neurology

## 2013-11-06 VITALS — BP 103/70 | HR 83 | Resp 16 | Ht 72.0 in | Wt 188.0 lb

## 2013-11-06 DIAGNOSIS — Z9989 Dependence on other enabling machines and devices: Principal | ICD-10-CM

## 2013-11-06 DIAGNOSIS — G4733 Obstructive sleep apnea (adult) (pediatric): Secondary | ICD-10-CM

## 2013-11-06 NOTE — Patient Instructions (Signed)

## 2013-11-06 NOTE — Progress Notes (Signed)
SLEEP MEDICINE CLINIC   Provider:  Melvyn Novas, M D  Referring Provider: Kimber Relic, MD Primary Care Physician:  Kimber Relic, MD  Chief Complaint  Patient presents with  . New Evaluation    Room 10  . Sleep consult    HPI:  James [name] is a 53 y.o. male , who is seen here as a referral from Dr. Chilton Si for a sleep consultation.  [name]  is a right-handed Caucasian gentleman who underwent a sleep study on 08-23-09 here at [facility] sleep,  which documented an AHI of 22.3.  His primary care physician had referred him after her the patient was to hold during a camping trip that fellow travelers had noticed him to snore loudly and have frequent apneas.  He also has severe restless legs.  He returned for a titration study on 11-22-09 and a respites left nasal pillow mask and medium size was used to.  The patient was titrated to 8 cm water, at which pressure his AHI current of 0.0.  He was able to sleep under 10 minutes under the pressure.  Some snoring was still noted but to a much lower volume.  Bisacodyl has been compliant he using her CPAP ever since and for the first time today I see her download.  He had been lost in followup also he tried to make an appointment with our sleep clinic.   The CPAP download dated 11-05-13 encompassed  90 days,  an average daily user time of 7 hours and 39 minutes , 100% compliance of 4 hours.  The machine is set at 8 cm of water without EPR and his residual AHI is 1.6.  In spite of being on CPAP, he cannot sleep in the same bed with his partner, reporting RLS.  It seems, he has not RLS, but PLM s, bothering his partner.    I explained to the patient that I am very sorry for the delayed consult visit, and glad he continued to use the prescribed CPAP in spite of no follow up form [facility] sleep.   The patient works at night, goes to bed at 1.30 AM and once he puts his CPAP on, he is promptly asleep. He never needs an alarm clock to  rise. Sleeps from 1.45 to 9.30 and mainly uninterrputed, only one nocturia.  Work starts at 2.30 PM.  He drinks sweet iced tea, mainly water.     The patient has a history of childhood asthma and was a smoker for many  ( 20 ?) years. Quit in 2001.   Review of Systems: Out of a complete 14 system review, the patient complains of only the following symptoms, and all other reviewed systems are negative.  tinnitus in the left, fatigue, shift work.   Epworth score zero  , Fatigue severity score 51  , depression score 1   History   Social History  . Marital Status: Married    Spouse Name: [name]    Number of Children: 0  . 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 Smoking status: Former Games developer  . Smokeless tobacco: Never Used  . Alcohol Use: Yes     Comment: rarely  . Drug Use: No  . Sexual Activity: Not on file   Other Topics Concern  . Not on file   Social History Narrative   Patient is married (James [name]) and  lives at home with his husband.   Patient has a college education.   Patient works full-time ([employer]).   Patient is right-handed.   Patient drinks one cup of coffee daily.    Family History  Problem Relation Age of Onset  . Heart disease Mother   . Hypertension Mother   . Diabetes Mother   . Depression Mother   . Cancer Father     2004 bladder cancer    Past Medical History  Diagnosis Date  . Asthma   . Hyperlipidemia   . Anxiety   . Headache, tension type, chronic 01/2012  . Depression   . BPH (benign prostatic hyperplasia)   . Cervicalgia     degenerative changes and foraminal stenoses  . Other malaise and fatigue   . Chest pain, unspecified   . Hyperglycemia     104 on 10/10/10  . Obstructive sleep apnea (adult) (pediatric)     using CPAP  . Supraventricular premature beats     noted on EKG 05/18/12  . Wrist pain, left   . Family history of colonic polyps       Father with benign colon polyp  . Hiatal hernia 09/19/10    Past Surgical History  Procedure Laterality Date  . Wisdom tooth extraction  03/2009  . Colon surgery  09/17/2008    poylps/hemorriods    Current Outpatient Prescriptions  Medication Sig Dispense Refill  . EPINEPHrine (EPIPEN) 0.3 mg/0.3 mL DEVI Inject 0.3 mLs (0.3 mg total) into the muscle as needed.  2 Device  0  . HYDROcodone-acetaminophen (NORCO) 7.5-325 MG per tablet Take one tablet up to four times daily as needed for pain  120 tablet  0  . LORazepam (ATIVAN) 1 MG tablet TAKE 1 TABLET BY MOUTH TWICE DAILY AS NEEDED FOR ANXIETY  60 tablet  0   No current facility-administered medications for this visit.    Allergies as of 11/06/2013 - Review Complete 11/06/2013  Allergen Reaction Noted  . Augmentin [amoxicillin-pot clavulanate]  06/06/2013  . Sedapap [butalbital-acetaminophen]  06/06/2013  . Skelaxin [metaxalone]  06/06/2013    Vitals: BP 103/70  Pulse 83  Resp 16  Ht 6' (1.829 m)  Wt 188 lb (85.276 kg)  BMI 25.49 kg/m2 Last Weight:  Wt Readings from Last 1 Encounters:  11/06/13 188 lb (85.276 kg)       Last Height:   Ht Readings from Last 1 Encounters:  11/06/13 6' (1.829 m)    Physical exam:  General: The patient is awake, alert and appears not in acute distress. The patient is well groomed. Head: Normocephalic, atraumatic. Neck is supple. Mallampati 2   neck circumference: 15 inches . Nasal airflow restricted , mustache , TMJ is evident . Retrognathia is not seen.  Cardiovascular:  Regular rate and rhythm , without  murmurs or carotid bruit, and without distended neck veins. Respiratory: Lungs are clear to auscultation. Skin:  Without evidence of edema, or rash Trunk: BMI is elevated and patient  has normal posture.  Neurologic exam : The patient is awake and alert, oriented to place and time.   Memory subjective described as intact.  There is a normal attention span & concentration ability.  Speech is fluent without  dysarthria, dysphonia or aphasia.  Mood and affect are appropriate.  Cranial nerves: Pupils are equal and briskly reactive to light. Funduscopic exam without evidence of pallor or edema.  Extraocular movements  in vertical and horizontal planes intact and without nystagmus. Visual fields  by finger perimetry are intact. Hearing to finger rub intact.  Facial sensation intact to fine touch. Facial motor strength is symmetric and tongue and uvula move midline.  Motor exam: Normal tone ,muscle bulk and symmetric ,strength in all extremities.  Sensory:  Fine touch, pinprick and vibration were tested in all extremities. Proprioception is normal.  Coordination: Rapid alternating movements in the fingers/hands is normal.  Finger-to-nose maneuver normal without evidence of ataxia, dysmetria or tremor.  Gait and station: Patient walks without assistive device . Strength within normal limits. Stance is stable and normal.   Deep tendon reflexes: in the  upper and lower extremities are symmetric and intact. Babinski maneuver response is  downgoing.   Assessment:  After physical and neurologic examination, review of laboratory studies, imaging, neurophysiology testing and pre-existing records, assessment is   1) OSA - very well controlled on CPAP with no need to adjust the settings, residual AHI 1.2 .  I wonder if asthma or COPD contribute to his feeing of SOB and possible cause fatigue    The patient was advised of the nature of the diagnosed sleep disorder , the treatment options and risks for general a health and wellness arising from not treating the condition.  Visit duration was 30 minutes.   Plan:  Treatment plan and additional workup :  I would like to use an overnight pulse oximetry on this patient while on CPAP on room air. RV once a year .  If ONO gives evidencne of hypoxemia or arrhythmia, will call patient within 14 days to arrange for earlier visit with NP.      Porfirio Mylar Makail Watling MD  11/06/2013

## 2013-11-14 ENCOUNTER — Other Ambulatory Visit: Payer: Self-pay | Admitting: *Deleted

## 2013-11-14 MED ORDER — HYDROCODONE-ACETAMINOPHEN 7.5-325 MG PO TABS: ORAL_TABLET | ORAL | Status: DC

## 2013-11-14 NOTE — Telephone Encounter (Signed)
Patient Requested 

## 2013-11-15 ENCOUNTER — Other Ambulatory Visit: Payer: Self-pay | Admitting: Internal Medicine

## 2013-12-11 ENCOUNTER — Other Ambulatory Visit: Payer: Self-pay | Admitting: *Deleted

## 2013-12-11 ENCOUNTER — Other Ambulatory Visit: Payer: Self-pay | Admitting: Internal Medicine

## 2013-12-11 MED ORDER — HYDROCODONE-ACETAMINOPHEN 7.5-325 MG PO TABS: ORAL_TABLET | ORAL | Status: DC

## 2013-12-11 MED ORDER — LORAZEPAM 1 MG PO TABS: ORAL_TABLET | ORAL | Status: DC

## 2013-12-11 NOTE — Telephone Encounter (Signed)
Patient Requested and will pick up 

## 2014-01-10 ENCOUNTER — Other Ambulatory Visit: Payer: Self-pay | Admitting: *Deleted

## 2014-01-10 MED ORDER — HYDROCODONE-ACETAMINOPHEN 7.5-325 MG PO TABS: ORAL_TABLET | ORAL | Status: DC

## 2014-01-10 NOTE — Telephone Encounter (Signed)
Patient Requested  And will pick up

## 2014-01-22 ENCOUNTER — Other Ambulatory Visit: Payer: Self-pay | Admitting: Internal Medicine

## 2014-02-09 ENCOUNTER — Other Ambulatory Visit: Payer: Self-pay | Admitting: *Deleted

## 2014-02-09 MED ORDER — HYDROCODONE-ACETAMINOPHEN 7.5-325 MG PO TABS: ORAL_TABLET | ORAL | Status: DC

## 2014-03-12 ENCOUNTER — Other Ambulatory Visit: Payer: Self-pay | Admitting: *Deleted

## 2014-03-12 ENCOUNTER — Other Ambulatory Visit: Payer: Self-pay | Admitting: Internal Medicine

## 2014-03-12 MED ORDER — HYDROCODONE-ACETAMINOPHEN 7.5-325 MG PO TABS: ORAL_TABLET | ORAL | Status: DC

## 2014-03-12 NOTE — Telephone Encounter (Signed)
Patient requested and will pick up 

## 2014-03-12 NOTE — Telephone Encounter (Signed)
Patient Requested to be phoned in

## 2014-04-09 ENCOUNTER — Other Ambulatory Visit: Payer: Self-pay | Admitting: *Deleted

## 2014-04-09 MED ORDER — HYDROCODONE-ACETAMINOPHEN 7.5-325 MG PO TABS: ORAL_TABLET | ORAL | Status: DC

## 2014-04-09 NOTE — Telephone Encounter (Signed)
Patient requested and will pick up 

## 2014-05-08 ENCOUNTER — Other Ambulatory Visit: Payer: Self-pay

## 2014-05-08 MED ORDER — HYDROCODONE-ACETAMINOPHEN 7.5-325 MG PO TABS: ORAL_TABLET | ORAL | Status: DC

## 2014-05-09 ENCOUNTER — Other Ambulatory Visit: Payer: Self-pay

## 2014-05-09 DIAGNOSIS — E785 Hyperlipidemia, unspecified: Secondary | ICD-10-CM

## 2014-05-09 DIAGNOSIS — Z0000 Encounter for general adult medical examination without abnormal findings: Secondary | ICD-10-CM

## 2014-05-09 DIAGNOSIS — R739 Hyperglycemia, unspecified: Secondary | ICD-10-CM

## 2014-05-09 DIAGNOSIS — N400 Enlarged prostate without lower urinary tract symptoms: Secondary | ICD-10-CM

## 2014-05-10 ENCOUNTER — Other Ambulatory Visit: Payer: 59

## 2014-05-10 DIAGNOSIS — R739 Hyperglycemia, unspecified: Secondary | ICD-10-CM

## 2014-05-10 DIAGNOSIS — N400 Enlarged prostate without lower urinary tract symptoms: Secondary | ICD-10-CM

## 2014-05-10 DIAGNOSIS — Z0000 Encounter for general adult medical examination without abnormal findings: Secondary | ICD-10-CM

## 2014-05-10 DIAGNOSIS — E785 Hyperlipidemia, unspecified: Secondary | ICD-10-CM

## 2014-05-11 LAB — COMPREHENSIVE METABOLIC PANEL
A/G RATIO: 1.7 (ref 1.1–2.5)
ALBUMIN: 4.1 g/dL (ref 3.5–5.5)
ALT: 17 IU/L (ref 0–44)
AST: 16 IU/L (ref 0–40)
Alkaline Phosphatase: 42 IU/L (ref 39–117)
BUN/Creatinine Ratio: 16 (ref 9–20)
BUN: 15 mg/dL (ref 6–24)
Bilirubin Total: 0.5 mg/dL (ref 0.0–1.2)
CALCIUM: 9.6 mg/dL (ref 8.7–10.2)
CO2: 23 mmol/L (ref 18–29)
Chloride: 102 mmol/L (ref 97–108)
Creatinine, Ser: 0.95 mg/dL (ref 0.76–1.27)
GFR calc Af Amer: 104 mL/min/{1.73_m2} (ref >59)
GFR calc non Af Amer: 90 mL/min/{1.73_m2} (ref >59)
GLOBULIN, TOTAL: 2.4 g/dL (ref 1.5–4.5)
Glucose: 102 mg/dL — ABNORMAL HIGH (ref 65–99)
Potassium: 4.4 mmol/L (ref 3.5–5.2)
SODIUM: 141 mmol/L (ref 134–144)
Total Protein: 6.5 g/dL (ref 6.0–8.5)

## 2014-05-11 LAB — CBC WITH DIFFERENTIAL/PLATELET
Basophils Absolute: 0 10*3/uL (ref 0.0–0.2)
Basos: 1 %
EOS: 7 %
Eosinophils Absolute: 0.4 10*3/uL (ref 0.0–0.4)
HCT: 41.3 % (ref 37.5–51.0)
Hemoglobin: 13.6 g/dL (ref 12.6–17.7)
IMMATURE GRANS (ABS): 0 10*3/uL (ref 0.0–0.1)
IMMATURE GRANULOCYTES: 0 %
LYMPHS: 41 %
Lymphocytes Absolute: 2.2 10*3/uL (ref 0.7–3.1)
MCH: 28.4 pg (ref 26.6–33.0)
MCHC: 32.9 g/dL (ref 31.5–35.7)
MCV: 86 fL (ref 79–97)
MONOCYTES: 10 %
MONOS ABS: 0.5 10*3/uL (ref 0.1–0.9)
NEUTROS PCT: 41 %
Neutrophils Absolute: 2.2 10*3/uL (ref 1.4–7.0)
PLATELETS: 190 10*3/uL (ref 150–379)
RBC: 4.79 x10E6/uL (ref 4.14–5.80)
RDW: 13.9 % (ref 12.3–15.4)
WBC: 5.3 10*3/uL (ref 3.4–10.8)

## 2014-05-11 LAB — LIPID PANEL
Chol/HDL Ratio: 2.8 ratio units (ref 0.0–5.0)
Cholesterol, Total: 223 mg/dL — ABNORMAL HIGH (ref 100–199)
HDL: 79 mg/dL (ref >39)
LDL Calculated: 130 mg/dL — ABNORMAL HIGH (ref 0–99)
Triglycerides: 70 mg/dL (ref 0–149)
VLDL Cholesterol Cal: 14 mg/dL (ref 5–40)

## 2014-05-11 LAB — PSA: PSA: 0.6 ng/mL (ref 0.0–4.0)

## 2014-05-11 LAB — TSH: TSH: 2.03 u[IU]/mL (ref 0.450–4.500)

## 2014-06-06 ENCOUNTER — Other Ambulatory Visit: Payer: Self-pay

## 2014-06-06 MED ORDER — HYDROCODONE-ACETAMINOPHEN 7.5-325 MG PO TABS: ORAL_TABLET | ORAL | Status: DC

## 2014-06-06 NOTE — Telephone Encounter (Signed)
Rx printed and placed at the front ready for pick up

## 2014-06-20 ENCOUNTER — Ambulatory Visit (INDEPENDENT_AMBULATORY_CARE_PROVIDER_SITE_OTHER): Payer: 59 | Admitting: Internal Medicine

## 2014-06-20 ENCOUNTER — Encounter: Payer: Self-pay | Admitting: Internal Medicine

## 2014-06-20 VITALS — BP 118/78 | HR 83 | Temp 98.0°F | Ht 72.0 in | Wt 190.6 lb

## 2014-06-20 DIAGNOSIS — F411 Generalized anxiety disorder: Secondary | ICD-10-CM | POA: Diagnosis not present

## 2014-06-20 DIAGNOSIS — R739 Hyperglycemia, unspecified: Secondary | ICD-10-CM

## 2014-06-20 DIAGNOSIS — Z0000 Encounter for general adult medical examination without abnormal findings: Secondary | ICD-10-CM

## 2014-06-20 DIAGNOSIS — M25532 Pain in left wrist: Secondary | ICD-10-CM

## 2014-06-20 DIAGNOSIS — M542 Cervicalgia: Secondary | ICD-10-CM | POA: Diagnosis not present

## 2014-06-20 DIAGNOSIS — N400 Enlarged prostate without lower urinary tract symptoms: Secondary | ICD-10-CM

## 2014-06-20 DIAGNOSIS — E785 Hyperlipidemia, unspecified: Secondary | ICD-10-CM | POA: Diagnosis not present

## 2014-06-20 MED ORDER — HYDROCODONE-ACETAMINOPHEN 7.5-325 MG PO TABS: ORAL_TABLET | ORAL | Status: DC

## 2014-06-20 MED ORDER — LORAZEPAM 1 MG PO TABS: 1.0000 mg | ORAL_TABLET | Freq: Two times a day (BID) | ORAL | Status: DC | PRN

## 2014-06-20 NOTE — Progress Notes (Signed)
Patient ID: James [name], male   DOB: Dec 05, 1960, 54 y.o.   MRN: [id number]    HISTORY AND PHYSICAL  Location:    [facility]   Place of Service:   OFFICE  Extended Emergency Contact Information Primary Emergency Contact: [name],[name] Home Phone: [phone] Relation: Other  Advanced Directive information Does patient have an advance directive?: Yes, Type of Advance Directive: Living will, Does patient want to make changes to advanced directive?: No - Patient declined  Chief Complaint  Patient presents with  . Annual Exam    CPE. Complains of slow start to urination    HPI:   Hyperglycemia: Minimal elevation on last lab  Hyperlipidemia: LDL 130 on last lab. HDL runs high at 59.  Wrist pain, left: Chronic and related to repetitive motions at work  BPH (benign prostatic hyperplasia): Slowing of urinary stream. Denies nocturia more than once.  Cervicalgia: Chronic neck discomfort but he again relates to movements at work.    Past Medical History  Diagnosis Date  . Asthma   . Hyperlipidemia   . Anxiety   . Headache, tension type, chronic 01/2012  . Depression   . BPH (benign prostatic hyperplasia)   . Cervicalgia     degenerative changes and foraminal stenoses  . Other malaise and fatigue   . Chest pain, unspecified   . Hyperglycemia     104 on 10/10/10  . Obstructive sleep apnea (adult) (pediatric)     using CPAP  . Supraventricular premature beats     noted on EKG 05/18/12  . Wrist pain, left   . Family history of colonic polyps     Father with benign colon polyp  . Hiatal hernia 09/19/10    Past Surgical History  Procedure Laterality Date  . Wisdom tooth extraction  03/2009  . Colon surgery  09/17/2008    poylps/hemorriods    Patient Care Team: Kimber Relic, MD as PCP - General (Internal Medicine)  History   Social History  . Marital Status: Married    Spouse Name: [name]  . Number of Children: 0  . 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 Smoking status: Former Games developer  . Smokeless tobacco: Never Used  . Alcohol Use: Yes     Comment: rarely  . Drug Use: No  . Sexual Activity: Not on file   Other Topics Concern  . Not on file   Social History Narrative   Patient is married ([name]) and lives at home with his husband.   Patient has a college education.   Patient works full-time ([employer]).   Patient is right-handed.   Patient drinks one cup of coffee daily.     reports that he has quit smoking. He has never used smokeless tobacco. He reports that he drinks alcohol. He reports that he does not use illicit drugs.  Family History  Problem Relation Age of Onset  . Heart disease Mother   . Hypertension Mother   . Diabetes Mother   . Depression Mother   . Cancer Father     2004 bladder cancer   Family Status  Relation Status Death Age  . Mother Alive   . Father Deceased   . Sister Alive   . Brother Biomedical engineer History  Administered Date(s) Administered  . Influenza-Unspecified 12/16/2009, 12/17/2010, 12/26/2013    Allergies  Allergen Reactions  .  Pyridoxine Other (See Comments)    Made him crazy  . Augmentin [Amoxicillin-Pot Clavulanate]   . Codeine   . Sedapap [Butalbital-Acetaminophen]   . Skelaxin [Metaxalone]     Medications: Patient's Medications  New Prescriptions   No medications on file  Previous Medications   EPINEPHRINE (EPIPEN) 0.3 MG/0.3 ML DEVI    Inject 0.3 mLs (0.3 mg total) into the muscle as needed.   HYDROCODONE-ACETAMINOPHEN (NORCO) 7.5-325 MG PER TABLET    Take one tablet up to four times daily as needed for pain   LORAZEPAM (ATIVAN) 1 MG TABLET    TAKE 1 TABLET BY MOUTH TWICE DAILY AS NEEDED FOR ANXIETY  Modified Medications   No medications on file  Discontinued Medications   FINASTERIDE (PROSCAR) 5 MG TABLET    TAKE 1 TABLET BY MOUTH DAILY TO HELP WITH PROSTATE    HYDROCODONE-ACETAMINOPHEN (NORCO) 7.5-325 MG PER TABLET    Take one tablet up to four times daily as needed for pain    Review of Systems  Constitutional: Positive for fatigue. Negative for fever, diaphoresis, activity change, appetite change and unexpected weight change.  HENT: Negative.   Eyes: Positive for visual disturbance.  Respiratory: Negative.   Cardiovascular: Negative for chest pain, palpitations and leg swelling.  Gastrointestinal: Negative.  Negative for nausea, abdominal pain, diarrhea, constipation and abdominal distention.  Endocrine: Negative.   Genitourinary: Negative for dysuria, hematuria, flank pain and difficulty urinating.       History of BPH  Musculoskeletal:       Discomfort in the left wrist that radiates up the left arm.  Skin: Negative.   Allergic/Immunologic: Negative.   Neurological: Negative.   Hematological: Negative.   Psychiatric/Behavioral: Negative.     Filed Vitals:   06/20/14 1332  BP: 118/78  Pulse: 83  Temp: 98 F (36.7 C)  TempSrc: Oral  Height: 6' (1.829 m)  Weight: 190 lb 9.6 oz (86.456 kg)   Body mass index is 25.84 kg/(m^2).  Physical Exam  Constitutional: He is oriented to person, place, and time. He appears well-developed and well-nourished. No distress.  HENT:  Right Ear: External ear normal.  Left Ear: External ear normal.  Nose: Nose normal.  Mouth/Throat: Oropharynx is clear and moist. No oropharyngeal exudate.  Eyes: Conjunctivae and EOM are normal. Pupils are equal, round, and reactive to light.  Neck: No JVD present. No tracheal deviation present. No thyromegaly present.  Cardiovascular: Normal rate, regular rhythm, normal heart sounds and intact distal pulses.  Exam reveals no gallop and no friction rub.   No murmur heard. Pulmonary/Chest: No respiratory distress. He has no wheezes. He has no rales. He exhibits no tenderness.  Abdominal: He exhibits no distension and no mass. There is no tenderness.  Genitourinary:  Rectum normal, prostate normal and penis normal. Guaiac negative stool.  Musculoskeletal: Normal range of motion. He exhibits tenderness (left wrist on palpation). He exhibits no edema.  Lymphadenopathy:    He has no cervical adenopathy.  Neurological: He is alert and oriented to person, place, and time. He has normal reflexes. No cranial nerve deficit. Coordination normal.  Skin: No rash noted. No erythema. No pallor.  Psychiatric: He has a normal mood and affect. His behavior is normal. Judgment and thought content normal.     Labs reviewed: Appointment on 05/10/2014  Component Date Value Ref Range Status  . Cholesterol, Total 05/10/2014 223* 100 - 199 mg/dL Final  . Triglycerides 05/10/2014 70  0 - 149 mg/dL Final  . HDL  05/10/2014 79  >39 mg/dL Final   Comment: According to ATP-III Guidelines, HDL-C >59 mg/dL is considered a negative risk factor for CHD.   Marland Kitchen VLDL Cholesterol Cal 05/10/2014 14  5 - 40 mg/dL Final  . LDL Calculated 05/10/2014 161* 0 - 99 mg/dL Final  . Chol/HDL Ratio 05/10/2014 2.8  0.0 - 5.0 ratio units Final   Comment:                                   T. Chol/HDL Ratio                                             Men  Women                               1/2 Avg.Risk  3.4    3.3                                   Avg.Risk  5.0    4.4                                2X Avg.Risk  9.6    7.1                                3X Avg.Risk 23.4   11.0   . Glucose 05/10/2014 102* 65 - 99 mg/dL Final  . BUN 09/60/4540 15  6 - 24 mg/dL Final  . Creatinine, Ser 05/10/2014 0.95  0.76 - 1.27 mg/dL Final  . GFR calc non Af Amer 05/10/2014 90  >59 mL/min/1.73 Final  . GFR calc Af Amer 05/10/2014 104  >59 mL/min/1.73 Final  . BUN/Creatinine Ratio 05/10/2014 16  9 - 20 Final  . Sodium 05/10/2014 141  134 - 144 mmol/L Final  . Potassium 05/10/2014 4.4  3.5 - 5.2 mmol/L Final  . Chloride 05/10/2014 102  97 - 108 mmol/L Final  . CO2 05/10/2014 23  18 - 29 mmol/L Final  . Calcium  05/10/2014 9.6  8.7 - 10.2 mg/dL Final  . Total Protein 05/10/2014 6.5  6.0 - 8.5 g/dL Final  . Albumin 98/01/9146 4.1  3.5 - 5.5 g/dL Final  . Globulin, Total 05/10/2014 2.4  1.5 - 4.5 g/dL Final  . Albumin/Globulin Ratio 05/10/2014 1.7  1.1 - 2.5 Final  . Bilirubin Total 05/10/2014 0.5  0.0 - 1.2 mg/dL Final  . Alkaline Phosphatase 05/10/2014 42  39 - 117 IU/L Final  . AST 05/10/2014 16  0 - 40 IU/L Final  . ALT 05/10/2014 17  0 - 44 IU/L Final  . TSH 05/10/2014 2.030  0.450 - 4.500 uIU/mL Final  . WBC 05/10/2014 5.3  3.4 - 10.8 x10E3/uL Final  . RBC 05/10/2014 4.79  4.14 - 5.80 x10E6/uL Final  . Hemoglobin 05/10/2014 13.6  12.6 - 17.7 g/dL Final  . HCT 82/95/6213 41.3  37.5 - 51.0 % Final  . MCV 05/10/2014 86  79 - 97 fL Final  . MCH 05/10/2014 28.4  26.6 - 33.0 pg Final  .  MCHC 05/10/2014 32.9  31.5 - 35.7 g/dL Final  . RDW 16/12/9602 13.9  12.3 - 15.4 % Final  . Platelets 05/10/2014 190  150 - 379 x10E3/uL Final  . Neutrophils Relative % 05/10/2014 41   Final  . Lymphs 05/10/2014 41   Final  . Monocytes 05/10/2014 10   Final  . Eos 05/10/2014 7   Final  . Basos 05/10/2014 1   Final  . Neutrophils Absolute 05/10/2014 2.2  1.4 - 7.0 x10E3/uL Final  . Lymphocytes Absolute 05/10/2014 2.2  0.7 - 3.1 x10E3/uL Final  . Monocytes Absolute 05/10/2014 0.5  0.1 - 0.9 x10E3/uL Final  . Eosinophils Absolute 05/10/2014 0.4  0.0 - 0.4 x10E3/uL Final  . Basophils Absolute 05/10/2014 0.0  0.0 - 0.2 x10E3/uL Final  . Immature Granulocytes 05/10/2014 0   Final  . Immature Grans (Abs) 05/10/2014 0.0  0.0 - 0.1 x10E3/uL Final  . PSA 05/10/2014 0.6  0.0 - 4.0 ng/mL Final   Comment: Roche ECLIA methodology. According to the American Urological Association, Serum PSA should decrease and remain at undetectable levels after radical prostatectomy. The AUA defines biochemical recurrence as an initial PSA value 0.2 ng/mL or greater followed by a subsequent confirmatory PSA value 0.2 ng/mL or  greater. Values obtained with different assay methods or kits cannot be used interchangeably. Results cannot be interpreted as absolute evidence of the presence or absence of malignant disease.      Assessment/Plan  1. Hyperglycemia No treatment. Currently controlled on diet alone.  2. Hyperlipidemia No need for medication  3. Wrist pain, left Intermittent. Chronic. Unchanged and possibly work-related.  4. BPH (benign prostatic hyperplasia) Stable. Prostate actually is staying fairly normal size. Symptomatology of hesitation on initiation of urination.  5. Cervicalgia Patient says he has benefited from therapeutic massage. He currently is paying for this himself, but believes he may be eligible as insurance approved by a physician. I do believe therapeutic massage is warranted. - PT massage; Future - HYDROcodone-acetaminophen (NORCO) 7.5-325 MG per tablet; Take one tablet up to four times daily as needed for pain  Dispense: 120 tablet; Refill: 0  6. Anxiety state Chronic. Mild. Benefits from current medications. - LORazepam (ATIVAN) 1 MG tablet; Take 1 tablet (1 mg total) by mouth 2 (two) times daily as needed. for anxiety  Dispense: 60 tablet; Refill: 3

## 2014-07-30 ENCOUNTER — Other Ambulatory Visit: Payer: Self-pay | Admitting: *Deleted

## 2014-07-30 DIAGNOSIS — F411 Generalized anxiety disorder: Secondary | ICD-10-CM

## 2014-07-30 DIAGNOSIS — M542 Cervicalgia: Secondary | ICD-10-CM

## 2014-07-30 MED ORDER — LORAZEPAM 1 MG PO TABS: 1.0000 mg | ORAL_TABLET | Freq: Two times a day (BID) | ORAL | Status: DC | PRN

## 2014-07-30 MED ORDER — HYDROCODONE-ACETAMINOPHEN 7.5-325 MG PO TABS: ORAL_TABLET | ORAL | Status: DC

## 2014-07-30 NOTE — Telephone Encounter (Signed)
Patient requested and will pick up. Wanted the Rx Mailed but couldn't do that due to office policy.

## 2014-08-27 ENCOUNTER — Other Ambulatory Visit: Payer: Self-pay | Admitting: *Deleted

## 2014-08-27 DIAGNOSIS — M542 Cervicalgia: Secondary | ICD-10-CM

## 2014-08-27 DIAGNOSIS — F411 Generalized anxiety disorder: Secondary | ICD-10-CM

## 2014-08-27 MED ORDER — HYDROCODONE-ACETAMINOPHEN 7.5-325 MG PO TABS: ORAL_TABLET | ORAL | Status: DC

## 2014-08-27 MED ORDER — LORAZEPAM 1 MG PO TABS: 1.0000 mg | ORAL_TABLET | Freq: Two times a day (BID) | ORAL | Status: DC | PRN

## 2014-08-27 MED ORDER — EPINEPHRINE 0.3 MG/0.3ML IJ SOAJ: INTRAMUSCULAR | Status: DC

## 2014-08-27 NOTE — Telephone Encounter (Signed)
Patient requested and will pick up 

## 2014-09-25 ENCOUNTER — Other Ambulatory Visit: Payer: Self-pay

## 2014-09-25 DIAGNOSIS — F411 Generalized anxiety disorder: Secondary | ICD-10-CM

## 2014-09-25 DIAGNOSIS — M542 Cervicalgia: Secondary | ICD-10-CM

## 2014-09-25 MED ORDER — HYDROCODONE-ACETAMINOPHEN 7.5-325 MG PO TABS: ORAL_TABLET | ORAL | Status: DC

## 2014-09-25 MED ORDER — LORAZEPAM 1 MG PO TABS: 1.0000 mg | ORAL_TABLET | Freq: Two times a day (BID) | ORAL | Status: DC | PRN

## 2014-09-25 NOTE — Telephone Encounter (Signed)
Had to go to another computer to print Rx's

## 2014-09-25 NOTE — Telephone Encounter (Signed)
Patient called for refill on Hydrocodone and Lorazepam, last refill 08/27/14. Printed Rx, did not print.

## 2014-10-25 ENCOUNTER — Other Ambulatory Visit: Payer: Self-pay | Admitting: *Deleted

## 2014-10-25 DIAGNOSIS — M542 Cervicalgia: Secondary | ICD-10-CM

## 2014-10-25 MED ORDER — HYDROCODONE-ACETAMINOPHEN 7.5-325 MG PO TABS: ORAL_TABLET | ORAL | Status: DC

## 2014-10-25 NOTE — Telephone Encounter (Signed)
Patient requested and will pick up 

## 2014-11-13 ENCOUNTER — Ambulatory Visit: Payer: 59 | Admitting: Neurology

## 2014-11-22 ENCOUNTER — Other Ambulatory Visit: Payer: Self-pay | Admitting: *Deleted

## 2014-11-22 ENCOUNTER — Ambulatory Visit (INDEPENDENT_AMBULATORY_CARE_PROVIDER_SITE_OTHER): Payer: 59 | Admitting: Neurology

## 2014-11-22 ENCOUNTER — Encounter: Payer: Self-pay | Admitting: Neurology

## 2014-11-22 ENCOUNTER — Encounter (INDEPENDENT_AMBULATORY_CARE_PROVIDER_SITE_OTHER): Payer: Self-pay

## 2014-11-22 VITALS — BP 110/70 | HR 78 | Resp 20 | Ht 72.0 in | Wt 193.0 lb

## 2014-11-22 DIAGNOSIS — J441 Chronic obstructive pulmonary disease with (acute) exacerbation: Secondary | ICD-10-CM

## 2014-11-22 DIAGNOSIS — G4733 Obstructive sleep apnea (adult) (pediatric): Secondary | ICD-10-CM | POA: Diagnosis not present

## 2014-11-22 DIAGNOSIS — F411 Generalized anxiety disorder: Secondary | ICD-10-CM

## 2014-11-22 DIAGNOSIS — Z9989 Dependence on other enabling machines and devices: Principal | ICD-10-CM

## 2014-11-22 DIAGNOSIS — M542 Cervicalgia: Secondary | ICD-10-CM

## 2014-11-22 MED ORDER — LORAZEPAM 1 MG PO TABS: 1.0000 mg | ORAL_TABLET | Freq: Two times a day (BID) | ORAL | Status: DC | PRN

## 2014-11-22 MED ORDER — HYDROCODONE-ACETAMINOPHEN 7.5-325 MG PO TABS: ORAL_TABLET | ORAL | Status: DC

## 2014-11-22 NOTE — Telephone Encounter (Signed)
Patient requested and will pick up 

## 2014-11-22 NOTE — Progress Notes (Signed)
SLEEP MEDICINE CLINIC   Provider:  Melvyn Novas, M D  Referring Provider: Kimber Relic, MD Primary Care Physician:  James Relic, MD  Chief Complaint  Patient presents with  . Follow-up    cpap, rm 10, alone    HPI:  James [name] is a 54 y.o. male , who is seen here as a referral from Dr. Chilton [name] for a sleep consultation. [name]  is a right-handed Caucasian gentleman who underwent a sleep study on 08-23-09 at [facility],  which documented an AHI of 22.3.  His primary care physician had referred him after her the patient was to hold during a camping trip that fellow travelers had noticed him to snore loudly and have frequent apneas.  He also has severe restless legs. He returned for a titration study on 11-22-09 and a respites left nasal pillow mask and medium size was used to. The patient was titrated to 8 cm water, at which pressure his AHI current of 0.0. He was able to sleep under 10 minutes under the pressure. Some snoring was still noted but to a much lower volume.  Bisacodyl has been compliant he using her CPAP ever since and for the first time today I see her download.  He had been lost in followup also he tried to make an appointment with our sleep clinic.  The CPAP download dated 11-05-13 encompassed  90 days,  an average daily user time of 7 hours and 39 minutes , 100% compliance of 4 hours. The machine is set at 8 cm of water without EPR and his residual AHI is 1.6. In spite of being on CPAP, he cannot sleep in the same bed with his partner, reporting RLS.  It seems, he has not RLS, but PLM s, bothering his partner.  The patient works at night, goes to bed at 1.30 AM and once he puts his CPAP on, he is promptly asleep. He never needs an alarm clock to rise. Sleeps from 1.45 to 9.30 and mainly uninterrputed, only one nocturia.  Work starts at 2.30 PM.  He drinks sweet iced tea, mainly water.   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 He is 100% compliant with the use of CPAP over 4 hours nightly on a 30 day download obtained in office today. Average user time is 7 hours 59 minutes set pressure is 8 cm water without EPR and AHI is 1.9. He does have some air leaks but this is related to his facial hair. Always some Epworth sleepiness score is endorsed at 0. Fatigue severity is endorsed at 0. The patient's partner sleeps in a different room actually on a different floor of the house. Was begun but the patient was still snoring and kicking a lot at night, so he is not able to bear witness to how the sleep habits have changed. A she uses a 56 year old ResMed machine and he would like to have a more trouble friendly option available lighter and perhaps quieter. The patient has noticed that the machine is noisier than it used to be. He would also like to obtain a car [employer] to be able to camp and uses CPAP machine. In addition he travels long distances and would like to have a CPAP machine that would be usable on a  Plane. He is a skier and likes to travel to high altitude , may need a machine that could adjust  to detect central apneas.   The patient has a history of childhood asthma and was a smoker for many  ( 20 ?) years. Quit in 2001.   Review of Systems: Out of a complete 14 system review, the patient complains of only the following symptoms, and all other reviewed systems are negative.  tinnitus in the left, some fatigue related to shift work.   Epworth score zero  , Fatigue severity score zero , depression score 1.   Social History   Social History  . Marital Status: Married    Spouse Name: James [name]  . Number of Children: 0  . 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 Smoking status: Former Games developer  . Smokeless tobacco: Never Used  . Alcohol Use: Yes     Comment: rarely  . Drug Use: No  . Sexual Activity: Not  on file   Other Topics Concern  . Not on file   Social History Narrative   Patient is married (James [name]) and lives at home with his husband.   Patient has a college education.   Patient works full-time ([employer]).   Patient is right-handed.   Patient drinks one cup of coffee daily.    Family History  Problem Relation Age of Onset  . Heart disease Mother   . Hypertension Mother   . Diabetes Mother   . Depression Mother   . Cancer Father     2004 bladder cancer    Past Medical History  Diagnosis Date  . Asthma   . Hyperlipidemia   . Anxiety   . Headache, tension type, chronic 01/2012  . Depression   . BPH (benign prostatic hyperplasia)   . Cervicalgia     degenerative changes and foraminal stenoses  . Other malaise and fatigue   . Chest pain, unspecified   . Hyperglycemia     104 on 10/10/10  . Obstructive sleep apnea (adult) (pediatric)     using CPAP  . Supraventricular premature beats     noted on EKG 05/18/12  . Wrist pain, left   . Family history of colonic polyps     Father with benign colon polyp  . Hiatal hernia 09/19/10    Past Surgical History  Procedure Laterality Date  . Wisdom tooth extraction  03/2009  . Colon surgery  09/17/2008    poylps/hemorriods    Current Outpatient Prescriptions  Medication Sig Dispense Refill  . EPINEPHrine (EPIPEN) 0.3 mg/0.3 mL DEVI Inject 0.3 mLs (0.3 mg total) into the muscle as needed. 2 Device 0  . EPINEPHrine 0.3 mg/0.3 mL IJ SOAJ injection Inject 0.55ml into the muscle as needed for allergic reaction 2 Device 3  . HYDROcodone-acetaminophen (NORCO) 7.5-325 MG per tablet Take one tablet up to four times daily as needed for pain 120 tablet 0  . LORazepam (ATIVAN) 1 MG tablet Take 1 tablet (1 mg total) by mouth 2 (two) times daily as needed. for anxiety 60 tablet 0   No current facility-administered medications for this visit.    Allergies as of 11/22/2014 - Review Complete 11/22/2014  Allergen  Reaction Noted  . Pyridoxine Other (See Comments) 06/20/2014  . Augmentin [amoxicillin-pot clavulanate]  06/06/2013  . Codeine  06/20/2014  . Sedapap [butalbital-acetaminophen]  06/06/2013  . Skelaxin [metaxalone]  06/06/2013    Vitals: BP 110/70 mmHg  Pulse 78  Resp 20  Ht 6' (1.829 m)  Wt 193 lb (87.544 kg)  BMI 26.17 kg/m2 Last Weight:  Wt Readings from Last 1 Encounters:  11/22/14 193 lb (87.544 kg)       Last Height:   Ht Readings from Last 1 Encounters:  11/22/14 6' (1.829 m)    Physical exam:  General: The patient is awake, alert and appears not in acute distress. The patient is well groomed. Head: Normocephalic, atraumatic. Neck is supple. Mallampati 2   neck circumference: 15 inches . Nasal airflow restricted, mustache, TMJ is evident. Retrognathia is not seen.  Cardiovascular:  Regular rate and rhythm , without  murmurs or carotid bruit, and without distended neck veins. Respiratory: Lungs are clear to auscultation. Skin:  Without evidence of edema, or rash Trunk: BMI is elevated and patient  has normal posture.  Neurologic exam : The patient is awake and alert, oriented to place and time.   Memory subjective described as intact.  There is a normal attention span & concentration ability. Speech is fluent without  dysarthria, dysphonia or aphasia.  Mood and affect are appropriate.  Cranial nerves: Pupils are equal and briskly reactive to light. Funduscopic exam without evidence of pallor or edema.  Extraocular movements  in vertical and horizontal planes intact and without nystagmus. Visual fields by finger perimetry are intact. Hearing to finger rub intact.  Facial sensation intact to fine touch. Facial motor strength is symmetric and tongue and uvula move midline. Motor exam: Normal tone ,muscle bulk and symmetric ,strength in all extremities. Sensory:  Fine touch, pinprick and vibration were tested in all extremities. Coordination: Rapid alternating movements  in the fingers/hands is normal.  Finger-to-nose maneuver normal without evidence of ataxia, dysmetria or tremor.  Gait and station: Patient walks without assistive device . Strength within normal limits. Stance is stable and normal.   Deep tendon reflexes: in the  upper and lower extremities are symmetric and intact. Babinski maneuver response is  downgoing.   Assessment:  After physical and neurologic examination, review of laboratory studies, imaging, neurophysiology testing and pre-existing records, assessment is   1) OSA - very well controlled on CPAP with no need to adjust the settings, residual AHI 1.9, 1000 % compliance - machine is noisy , will ask [facility] to evaluate if he qualifies for a new machine.  Dream station ?.  I wonder if asthma or COPD contribute to his feeing of SOB and possible cause fatigue - he travelled to high altitude destinations, had SOB. I reviewed his pulse oximetry from last year which showed no need to give additional oxygen the valid test time was 8 hours and 24 minutes, the mean oxygen saturation was 94.1% the lowest oxygen saturation was 82% he was a total time of 6 minutes under 89%. Again, no need to supplement oxygen.  The patient was advised of the nature of the diagnosed sleep disorder , the treatment options and risks for general a health and wellness arising from not treating the condition.  Visit duration was 20 minutes.   Plan:  Treatment plan and additional workup :  I would like to use an overnight pulse oximetry on this patient while on CPAP on room air. RV once a year . CPAP order to DME.     Porfirio Mylar Mansur Patti MD  11/22/2014

## 2014-12-03 ENCOUNTER — Other Ambulatory Visit: Payer: Self-pay

## 2014-12-03 DIAGNOSIS — G4733 Obstructive sleep apnea (adult) (pediatric): Secondary | ICD-10-CM

## 2014-12-03 DIAGNOSIS — Z9989 Dependence on other enabling machines and devices: Principal | ICD-10-CM

## 2014-12-03 NOTE — Progress Notes (Signed)
Spoke to [facility] and verified that pt is indeed a pt of theirs. He qualifies for a new cpap. [facility] wanted to know the settings for the machine and which brand that Dr. Vickey Huger prefers. Dr. Vickey Huger gave verbal order for a dream station 5-10 cm H2O auto cpap. Order sent to [facility].

## 2014-12-19 ENCOUNTER — Telehealth: Payer: Self-pay

## 2014-12-19 DIAGNOSIS — M542 Cervicalgia: Secondary | ICD-10-CM

## 2014-12-19 DIAGNOSIS — J301 Allergic rhinitis due to pollen: Secondary | ICD-10-CM | POA: Insufficient documentation

## 2014-12-19 DIAGNOSIS — F411 Generalized anxiety disorder: Secondary | ICD-10-CM

## 2014-12-19 DIAGNOSIS — J343 Hypertrophy of nasal turbinates: Secondary | ICD-10-CM | POA: Insufficient documentation

## 2014-12-19 DIAGNOSIS — J342 Deviated nasal septum: Secondary | ICD-10-CM | POA: Insufficient documentation

## 2014-12-19 DIAGNOSIS — J330 Polyp of nasal cavity: Secondary | ICD-10-CM | POA: Insufficient documentation

## 2014-12-19 MED ORDER — LORAZEPAM 1 MG PO TABS: 1.0000 mg | ORAL_TABLET | Freq: Two times a day (BID) | ORAL | Status: DC | PRN

## 2014-12-19 MED ORDER — HYDROCODONE-ACETAMINOPHEN 7.5-325 MG PO TABS: ORAL_TABLET | ORAL | Status: DC

## 2014-12-19 NOTE — Telephone Encounter (Signed)
RX's printed, patient aware he can pick-up tomorrow

## 2014-12-19 NOTE — Telephone Encounter (Signed)
Print prescriptions today while I'm here. I will sign them and he can pick them up tomorrow.

## 2014-12-19 NOTE — Telephone Encounter (Signed)
Patient left message on triage voicemail requesting rx's Ativan and Hydrocodone  I called patient back and informed him rx's will be ready for pick-up on Friday. Last filled 11/22/14, Due 12/22/14 ( Saturday). We can release on Friday, since due date is on a weekend day.. Patient would like for Dr.Green to make an exception for he is going out of town Friday morning. Dr.Green please advise if RX's can be printed early (tomorrow)

## 2014-12-24 ENCOUNTER — Telehealth: Payer: Self-pay

## 2014-12-24 NOTE — Telephone Encounter (Signed)
Patient came to office on 12/20/14 to ask Dr. Chilton Si about stopping Lorazepam. He needs to be off for 3 days prior to allergy skin test 01/07/15, at [facility]. He needs to know if he can just stop it or does he need to taper off. Per Dr. Chilton Si he can just stop the Lorazepam, no need to taper. Dr. Chilton Si wrote a note to stop Lorazepam 3 days prior to skin test procedure, mailed to patient. Called left message to patient, let him know that I was mailing note, and about stopping Lorazepam.

## 2015-01-18 ENCOUNTER — Other Ambulatory Visit: Payer: Self-pay

## 2015-01-18 DIAGNOSIS — M542 Cervicalgia: Secondary | ICD-10-CM

## 2015-01-18 DIAGNOSIS — F411 Generalized anxiety disorder: Secondary | ICD-10-CM

## 2015-01-18 MED ORDER — HYDROCODONE-ACETAMINOPHEN 7.5-325 MG PO TABS: ORAL_TABLET | ORAL | Status: DC

## 2015-01-18 MED ORDER — LORAZEPAM 1 MG PO TABS: 1.0000 mg | ORAL_TABLET | Freq: Two times a day (BID) | ORAL | Status: DC | PRN

## 2015-01-18 NOTE — Telephone Encounter (Signed)
Last refill 12/19/14

## 2015-02-06 ENCOUNTER — Ambulatory Visit (INDEPENDENT_AMBULATORY_CARE_PROVIDER_SITE_OTHER): Payer: 59 | Admitting: Internal Medicine

## 2015-02-06 ENCOUNTER — Encounter: Payer: Self-pay | Admitting: Internal Medicine

## 2015-02-06 VITALS — BP 116/70 | HR 100 | Temp 97.9°F | Ht 72.0 in | Wt 193.0 lb

## 2015-02-06 DIAGNOSIS — K649 Unspecified hemorrhoids: Secondary | ICD-10-CM | POA: Diagnosis not present

## 2015-02-06 DIAGNOSIS — M542 Cervicalgia: Secondary | ICD-10-CM | POA: Diagnosis not present

## 2015-02-06 DIAGNOSIS — F411 Generalized anxiety disorder: Secondary | ICD-10-CM

## 2015-02-06 DIAGNOSIS — R300 Dysuria: Secondary | ICD-10-CM | POA: Diagnosis not present

## 2015-02-06 MED ORDER — HYDROCORTISONE 2.5 % RE CREA: TOPICAL_CREAM | RECTAL | Status: DC

## 2015-02-06 MED ORDER — LORAZEPAM 1 MG PO TABS: 1.0000 mg | ORAL_TABLET | Freq: Two times a day (BID) | ORAL | Status: DC | PRN

## 2015-02-06 MED ORDER — HYDROCODONE-ACETAMINOPHEN 7.5-325 MG PO TABS: ORAL_TABLET | ORAL | Status: DC

## 2015-02-06 NOTE — Patient Instructions (Signed)
Sitz bath may help.

## 2015-02-06 NOTE — Progress Notes (Signed)
Patient ID: James [name], male   DOB: [birth year], 54 y.o.   MRN: [id number]    Facility  [facility]    Place of Service:   OFFICE    Allergies  Allergen Reactions  . Pyridoxine Other (See Comments)    Made him crazy  . Augmentin [Amoxicillin-Pot Clavulanate]   . Bee Venom   . Codeine   . Sedapap [Butalbital-Acetaminophen]   . Skelaxin [Metaxalone]     Chief Complaint  Patient presents with  . Acute Visit    Hemorrhoids x 1 week. Hemorrhoid is painful and bleeding     HPI:  New problem of bleeding hemorrhoid. Soaked through Games developer. Active in biking and swimming. Using Preparation H, but is not helping.  Medications: Patient's Medications  New Prescriptions   No medications on file  Previous Medications   EPINEPHRINE 0.3 MG/0.3 ML IJ SOAJ INJECTION    Inject 0.22ml into the muscle as needed for allergic reaction   HYDROCODONE-ACETAMINOPHEN (NORCO) 7.5-325 MG TABLET    Take one tablet up to four times daily as needed for pain   LORAZEPAM (ATIVAN) 1 MG TABLET    Take 1 tablet (1 mg total) by mouth 2 (two) times daily as needed. for anxiety  Modified Medications   No medications on file  Discontinued Medications   EPINEPHRINE (EPIPEN) 0.3 MG/0.3 ML DEVI    Inject 0.3 mLs (0.3 mg total) into the muscle as needed.    Review of Systems  Constitutional: Positive for fatigue. Negative for fever, diaphoresis, activity change, appetite change and unexpected weight change.  HENT: Negative.   Eyes: Positive for visual disturbance.  Respiratory: Negative.   Cardiovascular: Negative for chest pain, palpitations and leg swelling.  Gastrointestinal: Negative.  Negative for nausea, abdominal pain, diarrhea, constipation and abdominal distention.       Bleeding hemorrhoid  Endocrine: Negative.   Genitourinary: Negative for dysuria, hematuria, flank pain and difficulty urinating.       History of BPH. Foul odor to urine and urgency. Hesitation on initiation of urination.    Musculoskeletal:       Discomfort in the left wrist that radiates up the left arm.  Skin: Negative.   Allergic/Immunologic: Negative.   Neurological: Negative.   Hematological: Negative.   Psychiatric/Behavioral: Negative.     Filed Vitals:   02/06/15 1440  BP: 116/70  Pulse: 100  Temp: 97.9 F (36.6 C)  TempSrc: Oral  Height: 6' (1.829 m)  Weight: 193 lb (87.544 kg)  SpO2: 96%   Body mass index is 26.17 kg/(m^2).  Physical Exam  Constitutional: He is oriented to person, place, and time. He appears well-developed and well-nourished. No distress.  HENT:  Right Ear: External ear normal.  Left Ear: External ear normal.  Nose: Nose normal.  Mouth/Throat: Oropharynx is clear and moist. No oropharyngeal exudate.  Eyes: Conjunctivae and EOM are normal. Pupils are equal, round, and reactive to light.  Neck: No JVD present. No tracheal deviation present. No thyromegaly present.  Cardiovascular: Normal rate, regular rhythm, normal heart sounds and intact distal pulses.  Exam reveals no gallop and no friction rub.   No murmur heard. Pulmonary/Chest: No respiratory distress. He has no wheezes. He has no rales. He exhibits no tenderness.  Abdominal: He exhibits no distension and no mass. There is no tenderness.  Genitourinary: Prostate normal and penis normal. Guaiac negative stool.  Thrombosed and bleeding hemorrhoid.  Musculoskeletal: Normal range of motion. He exhibits tenderness (left wrist on palpation). He exhibits no edema.  Lymphadenopathy:    He has no cervical adenopathy.  Neurological: He is alert and oriented to person, place, and time. He has normal reflexes. No cranial nerve deficit. Coordination normal.  Skin: No rash noted. No erythema. No pallor.  Psychiatric: He has a normal mood and affect. His behavior is normal. Judgment and thought content normal.    Labs reviewed: Lab Summary Latest Ref Rng 05/10/2014 06/07/2013  Hemoglobin 12.6 - 17.7 g/dL 13.6 14.0   Hematocrit 37.5 - 51.0 % 41.3 42.7  White count 3.4 - 10.8 x10E3/uL 5.3 6.0  Platelet count 150 - 379 x10E3/uL 190 (None)  Sodium 134 - 144 mmol/L 141 139  Potassium 3.5 - 5.2 mmol/L 4.4 4.6  Calcium 8.7 - 10.2 mg/dL 9.6 9.5  Phosphorus - (None) (None)  Creatinine 0.76 - 1.27 mg/dL 0.95 0.96  AST 0 - 40 IU/L 16 (None)  Alk Phos 39 - 117 IU/L 42 (None)  Bilirubin 0.0 - 1.2 mg/dL 0.5 (None)  Glucose 65 - 99 mg/dL 102(H) 99  Cholesterol - (None) (None)  HDL cholesterol >39 mg/dL 79 72  Triglycerides 0 - 149 mg/dL 70 75  LDL Direct - (None) (None)  LDL Calc 0 - 99 mg/dL 130(H) 141(H)  Total protein - (None) (None)  Albumin 3.5 - 5.5 g/dL 4.1 (None)   Lab Results  Component Value Date   TSH 2.030 05/10/2014   Lab Results  Component Value Date   BUN 15 05/10/2014   No results found for: HGBA1C  Assessment/Plan 1. Hemorrhoids, unspecified hemorrhoid type - Ambulatory referral to General Surgery - hydrocortisone (PROCTOSOL HC) 2.5 % rectal cream; Apply up to 3 times daily for hemorrhoid relief  Dispense: 30 g; Refill: 0  2. Stranguria - Urinalysis - Urine culture  3. Anxiety state - LORazepam (ATIVAN) 1 MG tablet; Take 1 tablet (1 mg total) by mouth 2 (two) times daily as needed. for anxiety  Dispense: 60 tablet; Refill: 0  4. Cervicalgia - HYDROcodone-acetaminophen (NORCO) 7.5-325 MG tablet; Take one tablet up to four times daily as needed for pain  Dispense: 120 tablet; Refill: 0

## 2015-02-07 LAB — URINALYSIS
BILIRUBIN UA: NEGATIVE
GLUCOSE, UA: NEGATIVE
Ketones, UA: NEGATIVE
LEUKOCYTES UA: NEGATIVE
Nitrite, UA: NEGATIVE
PROTEIN UA: NEGATIVE
RBC, UA: NEGATIVE
SPEC GRAV UA: 1.014 (ref 1.005–1.030)
Urobilinogen, Ur: 0.2 mg/dL (ref 0.2–1.0)
pH, UA: 7 (ref 5.0–7.5)

## 2015-02-08 LAB — URINE CULTURE

## 2015-02-19 ENCOUNTER — Telehealth: Payer: Self-pay | Admitting: *Deleted

## 2015-02-19 NOTE — Telephone Encounter (Signed)
Yes. Bring it to the office.

## 2015-02-19 NOTE — Telephone Encounter (Signed)
Patient called and wanted to know if you will fill out an FMLA form for him and his brother due to their mother being in and out of the Hospital and in the facility. Please Advise.

## 2015-02-20 NOTE — Telephone Encounter (Signed)
[facility] not[facility] for [facility] to bring paperwork to office.

## 2015-03-19 ENCOUNTER — Other Ambulatory Visit: Payer: Self-pay | Admitting: *Deleted

## 2015-03-19 DIAGNOSIS — F411 Generalized anxiety disorder: Secondary | ICD-10-CM

## 2015-03-19 DIAGNOSIS — M542 Cervicalgia: Secondary | ICD-10-CM

## 2015-03-19 MED ORDER — HYDROCODONE-ACETAMINOPHEN 7.5-325 MG PO TABS: ORAL_TABLET | ORAL | Status: DC

## 2015-03-19 MED ORDER — LORAZEPAM 1 MG PO TABS: 1.0000 mg | ORAL_TABLET | Freq: Two times a day (BID) | ORAL | Status: DC | PRN

## 2015-03-19 NOTE — Telephone Encounter (Signed)
Patient requested and will pick up. Narcotic Contract Printed 

## 2015-03-22 ENCOUNTER — Encounter: Payer: Self-pay | Admitting: Internal Medicine

## 2015-03-22 ENCOUNTER — Ambulatory Visit (INDEPENDENT_AMBULATORY_CARE_PROVIDER_SITE_OTHER): Payer: 59 | Admitting: Internal Medicine

## 2015-03-22 VITALS — BP 128/72 | HR 78 | Temp 97.5°F | Resp 18 | Ht 72.0 in | Wt 193.4 lb

## 2015-03-22 DIAGNOSIS — J069 Acute upper respiratory infection, unspecified: Secondary | ICD-10-CM

## 2015-03-22 DIAGNOSIS — J301 Allergic rhinitis due to pollen: Secondary | ICD-10-CM

## 2015-03-22 MED ORDER — METHYLPREDNISOLONE ACETATE 40 MG/ML IJ SUSP
40.0000 mg | Freq: Once | INTRAMUSCULAR | Status: AC
  Administered 2015-03-22: 40 mg via INTRAMUSCULAR

## 2015-03-22 MED ORDER — MONTELUKAST SODIUM 10 MG PO TABS: 10.0000 mg | ORAL_TABLET | Freq: Every day | ORAL | Status: DC

## 2015-03-22 NOTE — Patient Instructions (Addendum)
Push fluids and rest  May take as needed OTC antihistamine daily  Start singulair 10mg  daily  Follow up with ENT Dr Verne SpurrLeinbach if no better  Depo-medrol 40mg  injection given today  Follow up with Dr Chilton SiGreen as scheduled and as needed

## 2015-03-22 NOTE — Progress Notes (Signed)
Patient ID: James [name], male   DOB: 06/21/1960, 54 y.o.   MRN: [id number]    Location:    [facility]   Place of Service:  OFFICE   Chief Complaint  Patient presents with  . Acute Visit    Patient c/o Patient has bad cold and has been out of work for 4 days and needs a letter for work    HPI:  54 yo male seen today for URI sx's x 6 days. C/o SOB, cough, HA, dizziness, fatigue, low grade temp (Tm 99.0), chills, nasal congestion. Denies CP, post nasal drip, nausea, ear pain/pressure, sleep disturbance, change in appetite and fatigue. Tried OTC Catering manageralka seltzer. He has missed 5 days of work. He has a hx childhood asthma.  Past Medical History  Diagnosis Date  . Asthma   . Hyperlipidemia   . Anxiety   . Headache, tension type, chronic 01/2012  . Depression   . BPH (benign prostatic hyperplasia)   . Cervicalgia     degenerative changes and foraminal stenoses  . Other malaise and fatigue   . Chest pain, unspecified   . Hyperglycemia     104 on 10/10/10  . Obstructive sleep apnea (adult) (pediatric)     using CPAP  . Supraventricular premature beats     noted on EKG 05/18/12  . Wrist pain, left   . Family history of colonic polyps     Father with benign colon polyp  . Hiatal hernia 09/19/10    Past Surgical History  Procedure Laterality Date  . Wisdom tooth extraction  03/2009  . Colon surgery  09/17/2008    poylps/hemorriods    Patient Care Team: Kimber RelicArthur G Green, MD as PCP - General (Internal Medicine)  Social History   Social History  . Marital Status: Married    Spouse Name: Earnest ConroyRichard V [name]  . Number of Children: 0  . 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 Smoking status: Former Games developermoker  . Smokeless tobacco: Never Used  . Alcohol Use: Yes     Comment: rarely  . Drug Use: No  . Sexual Activity: Not on file   Other Topics Concern  . Not on file   Social History Narrative   Patient is married  (Richard V. Abran CantorFrye) and lives at home with his husband.   Patient has a college education.   Patient works full-time ([employer]).   Patient is right-handed.   Patient drinks one cup of coffee daily.     reports that he has quit smoking. He has never used smokeless tobacco. He reports that he drinks alcohol. He reports that he does not use illicit drugs.  Allergies  Allergen Reactions  . Pyridoxine Other (See Comments)    Made him crazy  . Augmentin [Amoxicillin-Pot Clavulanate]   . Bee Venom   . Codeine   . Sedapap [Butalbital-Acetaminophen]   . Skelaxin [Metaxalone]     Medications: Patient's Medications  New Prescriptions   No medications on file  Previous Medications   EPINEPHRINE 0.3 MG/0.3 ML IJ SOAJ INJECTION    Inject 0.393ml into the muscle as needed for allergic reaction   HYDROCODONE-ACETAMINOPHEN (NORCO) 7.5-325 MG TABLET    Take one tablet up to four times daily as needed for pain   HYDROCORTISONE (PROCTOSOL HC) 2.5 % RECTAL CREAM    Apply up to 3 times daily for hemorrhoid relief   LORAZEPAM (ATIVAN)  1 MG TABLET    Take 1 tablet (1 mg total) by mouth 2 (two) times daily as needed. for anxiety  Modified Medications   No medications on file  Discontinued Medications   No medications on file    Review of Systems  Constitutional: Positive for chills and fatigue. Negative for fever.  HENT: Positive for congestion.   Respiratory: Positive for cough and shortness of breath.   Cardiovascular: Negative for chest pain and leg swelling.  Neurological: Positive for dizziness and headaches.  All other systems reviewed and are negative.   Filed Vitals:   03/22/15 1408  BP: 128/72  Pulse: 78  Temp: 97.5 F (36.4 C)  TempSrc: Oral  Resp: 18  Height: 6' (1.829 m)  Weight: 193 lb 6.4 oz (87.726 kg)  SpO2: 98%   Body mass index is 26.22 kg/(m^2).  Physical Exam  Constitutional: He appears well-developed and well-nourished.  Looks ill in NAD  HENT:    Mouth/Throat: No oropharyngeal exudate.  TMs intact b/l with air fluid level. No sinus TTP. Nares with R>L enlarged turbinates, grey. Oropharynx cobblestoning and red but no exudate.  Eyes: Pupils are equal, round, and reactive to light. Right eye exhibits no discharge. Left eye exhibits no discharge. No scleral icterus.  Neck: Neck supple.  Cardiovascular: Normal rate, regular rhythm, normal heart sounds and intact distal pulses.  Exam reveals no gallop and no friction rub.   No murmur heard. No LE edema. No calf TTP  Pulmonary/Chest: Effort normal and breath sounds normal. No respiratory distress. He has no wheezes. He has no rales. He exhibits no tenderness.  Lymphadenopathy:    He has no cervical adenopathy.  Neurological: He is alert.  Skin: Skin is warm and dry. No rash noted.  Psychiatric: He has a normal mood and affect. His behavior is normal. Thought content normal.     Labs reviewed: Office Visit on 02/06/2015  Component Date Value Ref Range Status  . Specific Gravity, UA 02/06/2015 1.014  1.005 - 1.030 Final  . pH, UA 02/06/2015 7.0  5.0 - 7.5 Final  . Color, UA 02/06/2015 Yellow  Yellow Final  . Appearance Ur 02/06/2015 Clear  Clear Final  . Leukocytes, UA 02/06/2015 Negative  Negative Final  . Protein, UA 02/06/2015 Negative  Negative/Trace Final  . Glucose, UA 02/06/2015 Negative  Negative Final  . Ketones, UA 02/06/2015 Negative  Negative Final  . RBC, UA 02/06/2015 Negative  Negative Final  . Bilirubin, UA 02/06/2015 Negative  Negative Final  . Urobilinogen, Ur 02/06/2015 0.2  0.2 - 1.0 mg/dL Final  . Nitrite, UA 16/12/9602 Negative  Negative Final  . Urine Culture, Routine 02/06/2015 Final report   Final  . Urine Culture result 1 02/06/2015 Comment   Final   Comment: Mixed urogenital flora Less than 10,000 colonies/mL     No results found.   Assessment/Plan   ICD-9-CM ICD-10-CM   1. Allergic rhinitis due to pollen 477.0 J30.1 montelukast (SINGULAIR) 10  MG tablet     methylPREDNISolone acetate (DEPO-MEDROL) injection 40 mg  2. Acute upper respiratory infection - probably viral 465.9 J06.9 methylPREDNISolone acetate (DEPO-MEDROL) injection 40 mg   Push fluids and rest  May take as needed OTC antihistamine daily  Start singulair  daily  Follow up with ENT Dr Verne Spurr if no better  Depo-medrol  injection given today  Follow up with Dr Chilton Si as scheduled and as needed  [facility] [address] [place], [place] [address] [phone] Cell (Monday-Friday 8 AM - 5 PM) [phone] After 5 PM and follow prompts

## 2015-04-17 ENCOUNTER — Telehealth: Payer: Self-pay

## 2015-04-17 NOTE — Telephone Encounter (Signed)
Returned  patients call about refills of his medication,  informed him that his medication refills would be ready for pick up on Friday.

## 2015-04-19 ENCOUNTER — Other Ambulatory Visit: Payer: Self-pay | Admitting: *Deleted

## 2015-04-19 DIAGNOSIS — M542 Cervicalgia: Secondary | ICD-10-CM

## 2015-04-19 DIAGNOSIS — F411 Generalized anxiety disorder: Secondary | ICD-10-CM

## 2015-04-19 MED ORDER — LORAZEPAM 1 MG PO TABS: 1.0000 mg | ORAL_TABLET | Freq: Two times a day (BID) | ORAL | Status: DC | PRN

## 2015-04-19 MED ORDER — HYDROCODONE-ACETAMINOPHEN 7.5-325 MG PO TABS: ORAL_TABLET | ORAL | Status: DC

## 2015-04-19 NOTE — Telephone Encounter (Signed)
Patient requested and will pick up 

## 2015-05-20 ENCOUNTER — Other Ambulatory Visit: Payer: Self-pay | Admitting: *Deleted

## 2015-05-20 DIAGNOSIS — F411 Generalized anxiety disorder: Secondary | ICD-10-CM

## 2015-05-20 DIAGNOSIS — M542 Cervicalgia: Secondary | ICD-10-CM

## 2015-05-20 MED ORDER — LORAZEPAM 1 MG PO TABS: 1.0000 mg | ORAL_TABLET | Freq: Two times a day (BID) | ORAL | Status: DC | PRN

## 2015-05-20 MED ORDER — HYDROCODONE-ACETAMINOPHEN 7.5-325 MG PO TABS: ORAL_TABLET | ORAL | Status: DC

## 2015-05-20 NOTE — Telephone Encounter (Signed)
Patient requested and will pick up 

## 2015-06-19 ENCOUNTER — Other Ambulatory Visit: Payer: Self-pay

## 2015-06-19 DIAGNOSIS — F411 Generalized anxiety disorder: Secondary | ICD-10-CM

## 2015-06-19 DIAGNOSIS — M542 Cervicalgia: Secondary | ICD-10-CM

## 2015-06-19 MED ORDER — LORAZEPAM 1 MG PO TABS: 1.0000 mg | ORAL_TABLET | Freq: Two times a day (BID) | ORAL | Status: DC | PRN

## 2015-06-19 MED ORDER — HYDROCODONE-ACETAMINOPHEN 7.5-325 MG PO TABS: ORAL_TABLET | ORAL | Status: DC

## 2015-07-16 ENCOUNTER — Other Ambulatory Visit: Payer: Self-pay | Admitting: Internal Medicine

## 2015-07-19 ENCOUNTER — Other Ambulatory Visit: Payer: Self-pay

## 2015-07-19 DIAGNOSIS — F411 Generalized anxiety disorder: Secondary | ICD-10-CM

## 2015-07-19 DIAGNOSIS — M542 Cervicalgia: Secondary | ICD-10-CM

## 2015-07-19 MED ORDER — HYDROCODONE-ACETAMINOPHEN 7.5-325 MG PO TABS: ORAL_TABLET | ORAL | Status: DC

## 2015-07-19 MED ORDER — LORAZEPAM 1 MG PO TABS: 1.0000 mg | ORAL_TABLET | Freq: Two times a day (BID) | ORAL | Status: DC | PRN

## 2015-08-06 ENCOUNTER — Telehealth: Payer: Self-pay | Admitting: *Deleted

## 2015-08-06 DIAGNOSIS — E785 Hyperlipidemia, unspecified: Secondary | ICD-10-CM

## 2015-08-06 DIAGNOSIS — N400 Enlarged prostate without lower urinary tract symptoms: Secondary | ICD-10-CM

## 2015-08-06 DIAGNOSIS — R739 Hyperglycemia, unspecified: Secondary | ICD-10-CM

## 2015-08-06 NOTE — Telephone Encounter (Signed)
Patient called requesting bloodwork before his physical tomorrow so he could come in fasting and have it done and then go get breakfast and then see Dr. Chilton SiGreen. Orders placed for bloodwork and lab appointment made.

## 2015-08-07 ENCOUNTER — Encounter: Payer: Self-pay | Admitting: Internal Medicine

## 2015-08-07 ENCOUNTER — Ambulatory Visit (INDEPENDENT_AMBULATORY_CARE_PROVIDER_SITE_OTHER): Payer: 59 | Admitting: Internal Medicine

## 2015-08-07 ENCOUNTER — Other Ambulatory Visit: Payer: 59

## 2015-08-07 VITALS — BP 118/90 | HR 81 | Temp 97.7°F | Resp 18 | Ht 72.0 in | Wt 190.0 lb

## 2015-08-07 DIAGNOSIS — E785 Hyperlipidemia, unspecified: Secondary | ICD-10-CM

## 2015-08-07 DIAGNOSIS — N400 Enlarged prostate without lower urinary tract symptoms: Secondary | ICD-10-CM | POA: Diagnosis not present

## 2015-08-07 DIAGNOSIS — R739 Hyperglycemia, unspecified: Secondary | ICD-10-CM | POA: Diagnosis not present

## 2015-08-07 DIAGNOSIS — M542 Cervicalgia: Secondary | ICD-10-CM

## 2015-08-07 DIAGNOSIS — Z114 Encounter for screening for human immunodeficiency virus [HIV]: Secondary | ICD-10-CM | POA: Diagnosis not present

## 2015-08-07 DIAGNOSIS — M25532 Pain in left wrist: Secondary | ICD-10-CM

## 2015-08-07 DIAGNOSIS — R5382 Chronic fatigue, unspecified: Secondary | ICD-10-CM | POA: Diagnosis not present

## 2015-08-07 DIAGNOSIS — Z1159 Encounter for screening for other viral diseases: Secondary | ICD-10-CM | POA: Insufficient documentation

## 2015-08-07 MED ORDER — HYDROCODONE-ACETAMINOPHEN 7.5-325 MG PO TABS: ORAL_TABLET | ORAL | Status: DC

## 2015-08-07 NOTE — Progress Notes (Signed)
Patient ID: James [name], male   DOB: [birth year], 55 y.o.   MRN: [id number]    HISTORY AND PHYSICAL  Location:    [place]   Place of Service:   OFFICE  Extended Emergency Contact Information Primary Emergency Contact: [place] Phone: [phone] Relation: Other  Advanced Directive information Does patient have an advance directive?: No, Would patient like information on creating an advanced directive?: Yes - Educational materials given  Chief Complaint  Patient presents with  . Annual Exam    Annual Exam  . OTHER    Discuss Health Maintenace    HPI:  Mother died in 2015-03-22. Still having a little grief. More importantly, he feels there is some separation from his brother.  Planning to go to [place] this summer. Wants to be sure he has his hydrocodone. Leaving on October 04, 2015. His current next refill is 09/18/15. Using the medication for headache and neck pain. Feels like some days he would benefit from extra tablet.  Need for hepatitis C screening test  Screening for HIV (human immunodeficiency virus)  Cervicalgia -- chronic and worse depending on the day  BPH (benign prostatic hyperplasia) -- mild stranguria  Hyperglycemia - recheck lab  Hyperlipidemia - recheck lab  Chronic fatigue - chronic  Wrist pain, left - getting regular massage which helps.    Past Medical History  Diagnosis Date  . Asthma   . Hyperlipidemia   . Anxiety   . Headache, tension type, chronic 01/2012  . Depression   . BPH (benign prostatic hyperplasia)   . Cervicalgia     degenerative changes and foraminal stenoses  . Other malaise and fatigue   . Chest pain, unspecified   . Hyperglycemia     104 on 10/10/10  . Obstructive sleep apnea (adult) (pediatric)     using CPAP  . Supraventricular premature beats     noted on EKG 05/18/12  . Wrist pain, left   . Family history of colonic polyps     Father with benign colon polyp  . Hiatal hernia 09/19/10    Past  Surgical History  Procedure Laterality Date  . Wisdom tooth extraction  03/2009  . Colon surgery  09/17/2008    poylps/hemorriods    Patient Care Team: Estill Dooms, MD as PCP - General (Internal Medicine)  Social History   Social History  . Marital Status: Married    Spouse Name: Rosey Bath  . Number of Children: 0  . 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 Smoking status: Former Research scientist (life sciences)  . Smokeless tobacco: Never Used  . Alcohol Use: Yes     Comment: rarely  . Drug Use: No  . Sexual Activity: Not on file   Other Topics Concern  . Not on file   Social History Narrative   Patient is married (Richard V. Sharlene Motts) and lives at home with his husband.   Patient has a college education.   Patient works full-time ([employer]).   Patient is right-handed.   Patient drinks one cup of coffee daily.    reports that he has quit smoking. He has never used smokeless tobacco. He reports that he drinks alcohol. He reports that he does not use illicit drugs.  Family History  Problem Relation Age of Onset  . Heart disease Mother   . Hypertension Mother   . Diabetes Mother   . Depression  Mother   . Cancer Father     2004 bladder cancer  . Alcohol abuse Father    Family Status  Relation Status Death Age  . Mother Alive   . Father Deceased   . Sister Alive   . Brother Marketing executive History  Administered Date(s) Administered  . Influenza-Unspecified 12/16/2009, 12/17/2010, 12/26/2013, 12/07/2014    Allergies  Allergen Reactions  . Pyridoxine Other (See Comments)    Made him crazy  . Augmentin [Amoxicillin-Pot Clavulanate]   . Bee Venom   . Codeine   . Sedapap [Butalbital-Acetaminophen]   . Skelaxin [Metaxalone]     Medications: Patient's Medications  New Prescriptions   No medications on file  Previous Medications   EPINEPHRINE 0.3 MG/0.3 ML IJ SOAJ INJECTION    Inject 0.27m into  the muscle as needed for allergic reaction   HYDROCODONE-ACETAMINOPHEN (NORCO) 7.5-325 MG TABLET    Take one tablet up to four times daily as needed for pain   HYDROCORTISONE (PROCTOSOL HC) 2.5 % RECTAL CREAM    Apply up to 3 times daily for hemorrhoid relief   LORAZEPAM (ATIVAN) 1 MG TABLET    Take 1 tablet (1 mg total) by mouth 2 (two) times daily as needed. for anxiety   MONTELUKAST (SINGULAIR) 10 MG TABLET    TAKE 1 TABLET(10 MG) BY MOUTH AT BEDTIME  Modified Medications   No medications on file  Discontinued Medications   No medications on file    Review of Systems  Constitutional: Positive for fatigue. Negative for fever, diaphoresis, activity change, appetite change and unexpected weight change.  HENT: Negative.   Eyes: Positive for visual disturbance.  Respiratory: Negative.   Cardiovascular: Negative for chest pain, palpitations and leg swelling.  Gastrointestinal: Negative.  Negative for nausea, abdominal pain, diarrhea, constipation and abdominal distention.       Bleeding hemorrhoid  Endocrine: Negative.   Genitourinary: Negative for dysuria, hematuria, flank pain and difficulty urinating.       History of BPH. Foul odor to urine and urgency. Hesitation on initiation of urination.  Musculoskeletal:       Discomfort in the left wrist that radiates up the left arm.  Skin: Negative.   Allergic/Immunologic: Negative.   Neurological: Negative.   Hematological: Negative.   Psychiatric/Behavioral: Negative.     Filed Vitals:   08/07/15 1340  BP: 118/90  Pulse: 81  Temp: 97.7 F (36.5 C)  TempSrc: Oral  Resp: 18  Height: 6' (1.829 m)  Weight: 190 lb (86.183 kg)  SpO2: 96%   Body mass index is 25.76 kg/(m^2). Filed Weights   08/07/15 1340  Weight: 190 lb (86.183 kg)     Physical Exam  Constitutional: He is oriented to person, place, and time. He appears well-developed and well-nourished. No distress.  HENT:  Right Ear: External ear normal.  Left Ear: External  ear normal.  Nose: Nose normal.  Mouth/Throat: Oropharynx is clear and moist. No oropharyngeal exudate.  Eyes: Conjunctivae and EOM are normal. Pupils are equal, round, and reactive to light.  Neck: No JVD present. No tracheal deviation present. No thyromegaly present.  Cardiovascular: Normal rate, regular rhythm, normal heart sounds and intact distal pulses.  Exam reveals no gallop and no friction rub.   No murmur heard. Pulmonary/Chest: No respiratory distress. He has no wheezes. He has no rales. He exhibits no tenderness.  Abdominal: He exhibits no distension and no mass. There is no tenderness.  Genitourinary: Prostate normal and penis normal.  Guaiac negative stool.  Thrombosed and bleeding hemorrhoid.  Musculoskeletal: Normal range of motion. He exhibits tenderness (left wrist on palpation). He exhibits no edema.  Lymphadenopathy:    He has no cervical adenopathy.  Neurological: He is alert and oriented to person, place, and time. He has normal reflexes. No cranial nerve deficit. Coordination normal.  Skin: No rash noted. No erythema. No pallor.  Psychiatric: He has a normal mood and affect. His behavior is normal. Judgment and thought content normal.    Labs reviewed: Lab Summary Latest Ref Rng 05/10/2014  Hemoglobin 12.6 - 17.7 g/dL 13.6  Hematocrit 37.5 - 51.0 % 41.3  White count 3.4 - 10.8 x10E3/uL 5.3  Platelet count 150 - 379 x10E3/uL 190  Sodium 134 - 144 mmol/L 141  Potassium 3.5 - 5.2 mmol/L 4.4  Calcium 8.7 - 10.2 mg/dL 9.6  Phosphorus - (None)  Creatinine 0.76 - 1.27 mg/dL 0.95  AST 0 - 40 IU/L 16  Alk Phos 39 - 117 IU/L 42  Bilirubin 0.0 - 1.2 mg/dL 0.5  Glucose 65 - 99 mg/dL 102(H)  Cholesterol - (None)  HDL cholesterol >39 mg/dL 79  Triglycerides 0 - 149 mg/dL 70  LDL Direct - (None)  LDL Calc 0 - 99 mg/dL 130(H)  Total protein - (None)  Albumin 3.5 - 5.5 g/dL 4.1   Lab Results  Component Value Date   BUN 15 05/10/2014   No results found for:  HGBA1C Lab Results  Component Value Date   TSH 2.030 05/10/2014     Assessment/Plan  1. Need for hepatitis C screening test - Hep C Antibody  2. Screening for HIV (human immunodeficiency virus) - HIV antibody (with reflex)  3. Cervicalgia - HYDROcodone-acetaminophen (NORCO) 7.5-325 MG tablet; Take one tablet every 4 hours as needed for pain  Dispense: 180 tablet; Refill: 0  4. BPH (benign prostatic hyperplasia) stabale  5. Hyperglycemia -CMP  6. Hyperlipidemia -lipid  7. Chronic fatigue Uncertain etiology  8. Wrist pain, left Better with massage

## 2015-08-08 LAB — CBC WITH DIFFERENTIAL/PLATELET
BASOS: 0 %
Basophils Absolute: 0 10*3/uL (ref 0.0–0.2)
EOS (ABSOLUTE): 0.3 10*3/uL (ref 0.0–0.4)
EOS: 5 %
HEMATOCRIT: 40.4 % (ref 37.5–51.0)
Hemoglobin: 13.7 g/dL (ref 12.6–17.7)
Immature Grans (Abs): 0 10*3/uL (ref 0.0–0.1)
Immature Granulocytes: 0 %
LYMPHS ABS: 1.9 10*3/uL (ref 0.7–3.1)
Lymphs: 37 %
MCH: 28.5 pg (ref 26.6–33.0)
MCHC: 33.9 g/dL (ref 31.5–35.7)
MCV: 84 fL (ref 79–97)
MONOS ABS: 0.5 10*3/uL (ref 0.1–0.9)
Monocytes: 9 %
Neutrophils Absolute: 2.5 10*3/uL (ref 1.4–7.0)
Neutrophils: 49 %
PLATELETS: 204 10*3/uL (ref 150–379)
RBC: 4.8 x10E6/uL (ref 4.14–5.80)
RDW: 14.1 % (ref 12.3–15.4)
WBC: 5.2 10*3/uL (ref 3.4–10.8)

## 2015-08-08 LAB — LIPID PANEL
Chol/HDL Ratio: 2.8 ratio units (ref 0.0–5.0)
Cholesterol, Total: 213 mg/dL — ABNORMAL HIGH (ref 100–199)
HDL: 76 mg/dL (ref >39)
LDL CALC: 124 mg/dL — AB (ref 0–99)
Triglycerides: 65 mg/dL (ref 0–149)
VLDL CHOLESTEROL CAL: 13 mg/dL (ref 5–40)

## 2015-08-08 LAB — COMPREHENSIVE METABOLIC PANEL
ALT: 18 IU/L (ref 0–44)
AST: 20 IU/L (ref 0–40)
Albumin/Globulin Ratio: 1.9 (ref 1.2–2.2)
Albumin: 4.1 g/dL (ref 3.5–5.5)
Alkaline Phosphatase: 44 IU/L (ref 39–117)
BUN / CREAT RATIO: 18 (ref 9–20)
BUN: 16 mg/dL (ref 6–24)
Bilirubin Total: 0.3 mg/dL (ref 0.0–1.2)
CALCIUM: 9.3 mg/dL (ref 8.7–10.2)
CO2: 23 mmol/L (ref 18–29)
CREATININE: 0.88 mg/dL (ref 0.76–1.27)
Chloride: 102 mmol/L (ref 96–106)
GFR calc non Af Amer: 95 mL/min/{1.73_m2} (ref >59)
GFR, EST AFRICAN AMERICAN: 110 mL/min/{1.73_m2} (ref >59)
GLOBULIN, TOTAL: 2.2 g/dL (ref 1.5–4.5)
Glucose: 103 mg/dL — ABNORMAL HIGH (ref 65–99)
Potassium: 4.4 mmol/L (ref 3.5–5.2)
Sodium: 140 mmol/L (ref 134–144)
TOTAL PROTEIN: 6.3 g/dL (ref 6.0–8.5)

## 2015-08-08 LAB — PSA: Prostate Specific Ag, Serum: 0.8 ng/mL (ref 0.0–4.0)

## 2015-08-09 LAB — SPECIMEN STATUS REPORT

## 2015-08-09 LAB — HEPATITIS C ANTIBODY

## 2015-08-15 ENCOUNTER — Telehealth: Payer: Self-pay | Admitting: *Deleted

## 2015-08-15 ENCOUNTER — Other Ambulatory Visit: Payer: Self-pay

## 2015-08-15 DIAGNOSIS — M542 Cervicalgia: Secondary | ICD-10-CM

## 2015-08-15 DIAGNOSIS — F411 Generalized anxiety disorder: Secondary | ICD-10-CM

## 2015-08-15 MED ORDER — LORAZEPAM 1 MG PO TABS: 1.0000 mg | ORAL_TABLET | Freq: Two times a day (BID) | ORAL | Status: DC | PRN

## 2015-08-15 MED ORDER — HYDROCODONE-ACETAMINOPHEN 7.5-325 MG PO TABS: ORAL_TABLET | ORAL | Status: DC

## 2015-08-15 NOTE — Telephone Encounter (Signed)
Dr. Chilton SiGreen left note on my desk asking to find out if HIV Antibody (with reflex) (Order [id number]) can be done on blood previously drawn--If not, he will need to return to lab.  [facility] Chilton [facility]Green [facility] stating the HIV "Needs to be collected" Order was placed for this test along with the other tests that was drawn on 08/07/2015. I called [facility] and spoke with Melissa [phone], Account # [id number] and she stated that this test can't be added on due to the test has to be ran out of an unopened tube.  Called patient and he will come in tomorrow to have it redrawn. Patient also requested to pick up his pain medication and Alprazolam. (Debbie W. Printed and had Dr. Chilton SiGreen signed.)

## 2015-08-15 NOTE — Telephone Encounter (Signed)
Patient had called in for refill of Lorazepam and Hydrocodone. It had looked like the Hydrocodone had been refilled on 08/07/15, but it hadn't been printed. Printed both for Dr. Chilton SiGreen to sign. Patient to pick up tomorrow.

## 2015-08-16 ENCOUNTER — Other Ambulatory Visit: Payer: 59

## 2015-08-16 DIAGNOSIS — Z114 Encounter for screening for human immunodeficiency virus [HIV]: Secondary | ICD-10-CM

## 2015-08-17 LAB — HIV ANTIBODY (ROUTINE TESTING W REFLEX): HIV Screen 4th Generation wRfx: NONREACTIVE

## 2015-09-13 ENCOUNTER — Other Ambulatory Visit: Payer: Self-pay | Admitting: *Deleted

## 2015-09-13 DIAGNOSIS — M542 Cervicalgia: Secondary | ICD-10-CM

## 2015-09-13 DIAGNOSIS — F411 Generalized anxiety disorder: Secondary | ICD-10-CM

## 2015-09-13 MED ORDER — HYDROCODONE-ACETAMINOPHEN 7.5-325 MG PO TABS: ORAL_TABLET | ORAL | Status: DC

## 2015-09-13 MED ORDER — LORAZEPAM 1 MG PO TABS: 1.0000 mg | ORAL_TABLET | Freq: Two times a day (BID) | ORAL | Status: DC | PRN

## 2015-09-13 NOTE — Telephone Encounter (Signed)
Patient requested and will pick up on Monday. 

## 2015-09-17 ENCOUNTER — Other Ambulatory Visit: Payer: Self-pay | Admitting: *Deleted

## 2015-09-17 MED ORDER — EPINEPHRINE 0.3 MG/0.3ML IJ SOAJ: INTRAMUSCULAR | Status: DC

## 2015-09-17 NOTE — Telephone Encounter (Signed)
Patient requested to be faxed to pharmacy 

## 2015-10-25 ENCOUNTER — Other Ambulatory Visit: Payer: Self-pay

## 2015-10-25 DIAGNOSIS — M542 Cervicalgia: Secondary | ICD-10-CM

## 2015-10-25 DIAGNOSIS — F411 Generalized anxiety disorder: Secondary | ICD-10-CM

## 2015-10-25 MED ORDER — HYDROCODONE-ACETAMINOPHEN 7.5-325 MG PO TABS: ORAL_TABLET | ORAL | 0 refills | Status: DC

## 2015-10-25 MED ORDER — LORAZEPAM 1 MG PO TABS: 1.0000 mg | ORAL_TABLET | Freq: Two times a day (BID) | ORAL | 0 refills | Status: DC | PRN

## 2015-10-25 NOTE — Telephone Encounter (Signed)
Patient called asked for refills to pick up Monday. Done

## 2015-10-28 ENCOUNTER — Other Ambulatory Visit: Payer: Self-pay | Admitting: Internal Medicine

## 2015-11-27 ENCOUNTER — Other Ambulatory Visit: Payer: Self-pay | Admitting: *Deleted

## 2015-11-27 ENCOUNTER — Other Ambulatory Visit: Payer: Self-pay | Admitting: Internal Medicine

## 2015-11-27 DIAGNOSIS — F411 Generalized anxiety disorder: Secondary | ICD-10-CM

## 2015-11-27 DIAGNOSIS — M542 Cervicalgia: Secondary | ICD-10-CM

## 2015-11-27 MED ORDER — HYDROCODONE-ACETAMINOPHEN 7.5-325 MG PO TABS: ORAL_TABLET | ORAL | 0 refills | Status: DC

## 2015-11-27 MED ORDER — LORAZEPAM 1 MG PO TABS: 1.0000 mg | ORAL_TABLET | Freq: Two times a day (BID) | ORAL | 0 refills | Status: DC | PRN

## 2015-11-27 NOTE — Telephone Encounter (Signed)
Patient requested and will pick up 

## 2015-12-23 ENCOUNTER — Telehealth: Payer: Self-pay | Admitting: *Deleted

## 2015-12-23 ENCOUNTER — Ambulatory Visit (INDEPENDENT_AMBULATORY_CARE_PROVIDER_SITE_OTHER): Payer: 59 | Admitting: Neurology

## 2015-12-23 ENCOUNTER — Encounter: Payer: Self-pay | Admitting: Neurology

## 2015-12-23 VITALS — BP 120/80 | HR 80 | Resp 20 | Ht 72.0 in | Wt 182.0 lb

## 2015-12-23 DIAGNOSIS — Z9989 Dependence on other enabling machines and devices: Secondary | ICD-10-CM

## 2015-12-23 DIAGNOSIS — G4733 Obstructive sleep apnea (adult) (pediatric): Secondary | ICD-10-CM

## 2015-12-23 NOTE — Addendum Note (Signed)
Addended by: Melvyn NovasHMEIER, Imelda Dandridge on: 12/23/2015 11:14 AM   Modules accepted: Orders

## 2015-12-23 NOTE — Patient Instructions (Signed)
Broken CPAP- very noisy, over 57 years old.

## 2015-12-23 NOTE — Progress Notes (Signed)
SLEEP MEDICINE CLINIC   Provider:  Melvyn Novasarmen  Faustino [name], M D  Referring Provider: Kimber [name], James G, MD Primary Care Physician:  James HodgkinsArthur Green, MD  Chief Complaint  Patient presents with  . Follow-up    using cpap, going well    HPI:  James [name] is a 55 y.o. male , who was seen here as a referral from Dr. Chilton [name] for a sleep consultation. [name]  is a right-handed Caucasian gentleman who underwent a sleep study on 08-23-09 at [facility],  which documented an AHI of 22.3.  His primary care physician had referred him after her the patient was to hold during a camping trip that fellow travelers had noticed him to snore loudly and have frequent apneas.  He also has severe restless legs. He returned for a titration study on 11-22-09 and a respites left nasal pillow mask and medium size was used to. The patient was titrated to 8 cm water, at which pressure his AHI current of 0.0. He was able to sleep under 10 minutes under the pressure. Some snoring was still noted but to a much lower volume.  Bisacodyl has been compliant he using her CPAP ever since and for the first time today I see her download.  He had been lost in followup also he tried to make an appointment with our sleep clinic.  The CPAP download dated 11-05-13 encompassed  90 days,  an average daily user time of 7 hours and 39 minutes , 100% compliance of 4 hours. The machine is set at 8 cm of water without EPR and his residual AHI is 1.6. In spite of being on CPAP, he cannot sleep in the same bed with his partner, reporting RLS.  It seems, he has not RLS, but PLM s, bothering his partner.  The patient works at night, goes to bed at 1.30 AM and once he puts his CPAP on, he is promptly asleep. He never needs an alarm clock to rise. Sleeps from 1.45 to 9.30 and mainly uninterrputed, only one nocturia.  Work starts at 2.30 PM.  He drinks sweet iced tea, mainly water.   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 He is 100% compliant with the use of CPAP over 4 hours nightly on a 30 day download obtained in office today. Average user time is 7 hours 59 minutes set pressure is 8 cm water without EPR and AHI is 1.9. He does have some air leaks but this is related to his facial hair. Always some Epworth sleepiness score is endorsed at 0. Fatigue severity is endorsed at 0. The patient's partner sleeps in a different room actually on a different floor of the house. Was begun but the patient was still snoring and kicking a lot at night, so he is not able to bear witness to how the sleep habits have changed. A she uses a 57 year old ResMed machine and he would like to have a more trouble friendly option available lighter and perhaps quieter. The patient has noticed that the machine is noisier than it used to be. He would also like to obtain a car [employer] to be able to camp and uses CPAP machine. In addition he travels long distances and would like to have a CPAP machine that would be usable on a  Plane. He is a skier and likes to travel to high altitude , may need a machine that could adjust to  detect central apneas.  Interval history 12/23/2015, The pleasure of seeing James [name], today for a CPAP compliance visit his residual AHI is 1.78 cm water pressure without EPR and he has used the machine 100% of the last 30 days and each of those nights over 4 hours. His average user time is 8 hours and 7 minutes. No adjustments have to be made. He does have some higher air leaks but he is not bothered by it. He does like to use a nasal pillow. His Epworth sleepiness score is endorsed at 0,  The patient has a history of childhood asthma and was a smoker for many  ( 20 ?) years. Quit in 2001.   Review of Systems: Out of a complete 14 system review, the patient complains of only the following symptoms, and all other reviewed systems are negative.  tinnitus in the left, some  fatigue related to shift work.   Epworth score zero  , Fatigue severity score zero , depression score 1.   Social History   Social History  . Marital status: Married    Spouse name: James [name]  . Number of children: 0  . 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 Smoking status: Former Games developer  . Smokeless tobacco: Never Used  . Alcohol use Yes     Comment: rarely  . Drug use: No  . Sexual activity: Not on file   Other Topics Concern  . Not on file   Social History Narrative   Patient is married (James [name]) and lives at home with his husband.   Patient has a college education.   Patient works full-time ([employer]).   Patient is right-handed.   Patient drinks one cup of coffee daily.    Family History  Problem Relation Age of Onset  . Heart disease Mother   . Hypertension Mother   . Diabetes Mother   . Depression Mother   . Cancer Father     2004 bladder cancer  . Alcohol abuse Father     Past Medical History:  Diagnosis Date  . Anxiety   . Asthma   . BPH (benign prostatic hyperplasia)   . Cervicalgia    degenerative changes and foraminal stenoses  . Chest pain, unspecified   . Depression   . Family history of colonic polyps    Father with benign colon polyp  . Headache, tension type, chronic 01/2012  . Hiatal hernia 09/19/10  . Hyperglycemia    104 on 10/10/10  . Hyperlipidemia   . Obstructive sleep apnea (adult) (pediatric)    using CPAP  . Other malaise and fatigue   . Supraventricular premature beats    noted on EKG 05/18/12  . Wrist pain, left     Past Surgical History:  Procedure Laterality Date  . COLON SURGERY  09/17/2008   poylps/hemorriods  . WISDOM TOOTH EXTRACTION  03/2009    Current Outpatient Prescriptions  Medication Sig Dispense Refill  . EPINEPHrine 0.3 mg/0.3 mL IJ SOAJ injection Inject 0.79ml into the muscle as needed for allergic reaction 2 Device 3    . HYDROcodone-acetaminophen (NORCO) 7.5-325 MG tablet Take one tablet every 4 hours as needed for pain 180 tablet 0  . hydrocortisone (PROCTOSOL HC) 2.5 % rectal cream Apply up to 3 times daily for hemorrhoid relief 30 [name] 0  . LORazepam (ATIVAN) 1 MG tablet Take 1 tablet (  1 mg total) by mouth 2 (two) times daily as needed. for anxiety 60 tablet 0  . montelukast (SINGULAIR) 10 MG tablet TAKE 1 TABLET(10 MG) BY MOUTH AT BEDTIME 30 tablet 2   No current facility-administered medications for this visit.     Allergies as of 12/23/2015 - Review Complete 12/23/2015  Allergen Reaction Noted  . Pyridoxine Other (See Comments) 06/20/2014  . Augmentin [amoxicillin-pot clavulanate]  06/06/2013  . Bee venom  02/06/2015  . Codeine  06/20/2014  . Sedapap [butalbital-acetaminophen]  06/06/2013  . Skelaxin [metaxalone]  06/06/2013    Vitals: BP 120/80   Pulse 80   Resp 20   Ht 6' (1.829 m)   Wt 182 lb (82.6 kg)   BMI 24.68 kg/m  Last Weight:  Wt Readings from Last 1 Encounters:  12/23/15 182 lb (82.6 kg)       Last Height:   Ht Readings from Last 1 Encounters:  12/23/15 6' (1.829 m)    Physical exam:  General: The patient is awake, alert and appears not in acute distress.  The patient is well groomed. Head: Normocephalic, atraumatic. Neck is supple. Mallampati 2   neck circumference: 15 inches . Nasal airflow restricted, mustache, TMJ is evident. Retrognathia is not seen.  Cardiovascular: Regular rate and rhythm , without  murmurs or carotid bruit, and without distended neck veins. Respiratory: Lungs are clear to auscultation. Skin:  Without evidence of edema, or rash Trunk: BMI is elevated and patient has normal posture.  Neurologic exam : The patient is awake and alert, oriented to place and time.   Memory subjective described as intact.   Cranial nerves: Pupils are equal and briskly reactive to light. Funduscopic exam without evidence of pallor or edema.  Extraocular movements   in vertical and horizontal planes intact and without nystagmus. Visual fields by finger perimetry are intact. Hearing to finger rub intact. Facial sensation intact to fine touch. Facial motor strength is symmetric and tongue and uvula move midline. Motor exam: Normal tone ,muscle bulk and symmetric ,strength in all extremities. Sensory:  Fine touch, pinprick and vibration were tested in all extremities.   Assessment:  After physical and neurologic examination, review of laboratory studies, imaging, neurophysiology testing and pre-existing records, assessment is   1) OSA - very well controlled on CPAP with no need to adjust the settings, residual AHI  Of  1.7 -100 % compliance - machine is noisy , will ask advanced home care to evaluate if he qualifies for a new machine.  [facility] ?. I wonder if asthma or COPD contribute to his feeing of SOB and possible cause fatigue - he travelled to high altitude destinations, had SOB. I reviewed his pulse oximetry from last year which showed no need to give additional oxygen the valid test time was 8 hours and 7  minutes, the mean oxygen saturation was 94.1% the lowest oxygen saturation was 82% he was a total time of 6 minutes under 89%.  Again, no need to supplement oxygen.  The patient was advised of the nature of the diagnosed sleep disorder , the treatment options and risks for general a health and wellness arising from not treating the condition.  Visit duration was 20 minutes.   Plan:  Treatment plan and additional workup :   I would like to use an overnight pulse oximetry on this patient while on CPAP on room air.  RV once a year . CPAP order to DME.     James Novas MD  12/23/2015              

## 2015-12-23 NOTE — Telephone Encounter (Signed)
Patient called and left message on voicemail regarding his pain medication and if he could pick it up on Friday. I tried calling patient back to let him know that would be fine but his voicemail is full and cannot leave message.

## 2015-12-27 ENCOUNTER — Other Ambulatory Visit: Payer: Self-pay | Admitting: *Deleted

## 2015-12-27 DIAGNOSIS — M542 Cervicalgia: Secondary | ICD-10-CM

## 2015-12-27 DIAGNOSIS — F411 Generalized anxiety disorder: Secondary | ICD-10-CM

## 2015-12-27 MED ORDER — HYDROCODONE-ACETAMINOPHEN 7.5-325 MG PO TABS: ORAL_TABLET | ORAL | 0 refills | Status: DC

## 2015-12-27 MED ORDER — LORAZEPAM 1 MG PO TABS: 1.0000 mg | ORAL_TABLET | Freq: Two times a day (BID) | ORAL | 0 refills | Status: DC | PRN

## 2015-12-27 NOTE — Telephone Encounter (Signed)
Patient requested and will pick up 

## 2016-01-28 ENCOUNTER — Other Ambulatory Visit: Payer: Self-pay | Admitting: *Deleted

## 2016-01-28 DIAGNOSIS — M542 Cervicalgia: Secondary | ICD-10-CM

## 2016-01-28 DIAGNOSIS — F411 Generalized anxiety disorder: Secondary | ICD-10-CM

## 2016-01-28 MED ORDER — HYDROCODONE-ACETAMINOPHEN 7.5-325 MG PO TABS: ORAL_TABLET | ORAL | 0 refills | Status: DC

## 2016-01-28 MED ORDER — LORAZEPAM 1 MG PO TABS: 1.0000 mg | ORAL_TABLET | Freq: Two times a day (BID) | ORAL | 0 refills | Status: DC | PRN

## 2016-01-28 NOTE — Telephone Encounter (Signed)
Patient requested and will pick up 

## 2016-02-27 ENCOUNTER — Other Ambulatory Visit: Payer: Self-pay

## 2016-02-27 ENCOUNTER — Telehealth: Payer: Self-pay

## 2016-02-27 DIAGNOSIS — F411 Generalized anxiety disorder: Secondary | ICD-10-CM

## 2016-02-27 DIAGNOSIS — M542 Cervicalgia: Secondary | ICD-10-CM

## 2016-02-27 MED ORDER — HYDROCODONE-ACETAMINOPHEN 7.5-325 MG PO TABS: ORAL_TABLET | ORAL | 0 refills | Status: DC

## 2016-02-27 MED ORDER — LORAZEPAM 1 MG PO TABS: 1.0000 mg | ORAL_TABLET | Freq: Two times a day (BID) | ORAL | 0 refills | Status: DC | PRN

## 2016-02-27 NOTE — Telephone Encounter (Signed)
I called patient to let him know that he has 2 prescriptions ready to pick up at the office. Lorazepam 1 mg tablet, #60 no RF and hydrocodone/apap 7.5-325 mg tablet # 180 no RF.  Prescriptions were placed in the filing cabinet at the front desk.

## 2016-03-12 DIAGNOSIS — H903 Sensorineural hearing loss, bilateral: Secondary | ICD-10-CM | POA: Insufficient documentation

## 2016-03-12 DIAGNOSIS — E041 Nontoxic single thyroid nodule: Secondary | ICD-10-CM | POA: Insufficient documentation

## 2016-03-27 ENCOUNTER — Other Ambulatory Visit: Payer: Self-pay | Admitting: *Deleted

## 2016-03-27 DIAGNOSIS — M542 Cervicalgia: Secondary | ICD-10-CM

## 2016-03-27 DIAGNOSIS — F411 Generalized anxiety disorder: Secondary | ICD-10-CM

## 2016-03-27 MED ORDER — HYDROCODONE-ACETAMINOPHEN 7.5-325 MG PO TABS: ORAL_TABLET | ORAL | 0 refills | Status: DC

## 2016-03-27 MED ORDER — LORAZEPAM 1 MG PO TABS: 1.0000 mg | ORAL_TABLET | Freq: Two times a day (BID) | ORAL | 0 refills | Status: DC | PRN

## 2016-03-27 NOTE — Telephone Encounter (Signed)
Patient requested and will pick up 

## 2016-04-21 ENCOUNTER — Telehealth: Payer: Self-pay | Admitting: *Deleted

## 2016-04-21 NOTE — Telephone Encounter (Signed)
Patient called and wanted to know your thoughts about you prescribing a medication called PREP, stated its an oral medication for prophylaxis for HIV. Would like your advise. He stated that if he needs to come in he would. Please Advise.

## 2016-04-23 ENCOUNTER — Other Ambulatory Visit: Payer: Self-pay | Admitting: Internal Medicine

## 2016-04-23 DIAGNOSIS — N529 Male erectile dysfunction, unspecified: Secondary | ICD-10-CM

## 2016-04-23 DIAGNOSIS — Z206 Contact with and (suspected) exposure to human immunodeficiency virus [HIV]: Secondary | ICD-10-CM

## 2016-04-23 MED ORDER — SILDENAFIL CITRATE 50 MG PO TABS: ORAL_TABLET | ORAL | 4 refills | Status: DC

## 2016-04-23 MED ORDER — EMTRICITABINE-TENOFOVIR DF 200-300 MG PO TABS: ORAL_TABLET | ORAL | 3 refills | Status: DC

## 2016-04-23 NOTE — Telephone Encounter (Signed)
Filled prescription for Truvada and Viagra.

## 2016-04-23 NOTE — Progress Notes (Signed)
States "things are changing" in regards to a previously monogamous relationsip with his male partner. He is asking for Truvada and Viagra.

## 2016-04-29 ENCOUNTER — Other Ambulatory Visit: Payer: Self-pay

## 2016-04-29 DIAGNOSIS — F411 Generalized anxiety disorder: Secondary | ICD-10-CM

## 2016-04-29 DIAGNOSIS — M542 Cervicalgia: Secondary | ICD-10-CM

## 2016-04-29 MED ORDER — LORAZEPAM 1 MG PO TABS: 1.0000 mg | ORAL_TABLET | Freq: Two times a day (BID) | ORAL | 0 refills | Status: DC | PRN

## 2016-04-29 MED ORDER — HYDROCODONE-ACETAMINOPHEN 7.5-325 MG PO TABS: ORAL_TABLET | ORAL | 0 refills | Status: DC

## 2016-05-19 ENCOUNTER — Encounter: Payer: Self-pay | Admitting: Internal Medicine

## 2016-05-19 ENCOUNTER — Ambulatory Visit (INDEPENDENT_AMBULATORY_CARE_PROVIDER_SITE_OTHER): Payer: 59 | Admitting: Internal Medicine

## 2016-05-19 ENCOUNTER — Telehealth: Payer: Self-pay

## 2016-05-19 VITALS — BP 130/82 | HR 80 | Temp 97.7°F | Ht 72.0 in | Wt 183.0 lb

## 2016-05-19 DIAGNOSIS — Z205 Contact with and (suspected) exposure to viral hepatitis: Secondary | ICD-10-CM | POA: Diagnosis not present

## 2016-05-19 DIAGNOSIS — M542 Cervicalgia: Secondary | ICD-10-CM

## 2016-05-19 DIAGNOSIS — Z206 Contact with and (suspected) exposure to human immunodeficiency virus [HIV]: Secondary | ICD-10-CM | POA: Diagnosis not present

## 2016-05-19 DIAGNOSIS — N529 Male erectile dysfunction, unspecified: Secondary | ICD-10-CM

## 2016-05-19 DIAGNOSIS — Z1159 Encounter for screening for other viral diseases: Secondary | ICD-10-CM | POA: Diagnosis not present

## 2016-05-19 MED ORDER — HYDROCODONE-ACETAMINOPHEN 7.5-325 MG PO TABS: ORAL_TABLET | ORAL | 0 refills | Status: DC

## 2016-05-19 MED ORDER — SILDENAFIL CITRATE 20 MG PO TABS: ORAL_TABLET | ORAL | 4 refills | Status: DC

## 2016-05-19 NOTE — Progress Notes (Signed)
[facility]  [facility]    Place of Service:   OFFICE    Allergies  Allergen Reactions  . Pyridoxine Other (See Comments)    Made him crazy  . Augmentin [Amoxicillin-Pot Clavulanate]   . Bee Venom   . Codeine   . Sedapap [Butalbital-Acetaminophen]   . Skelaxin [Metaxalone]     Chief Complaint  Patient presents with  . Acute Visit    Truvada made him nausea and headache after first few feeling better now. Would like to talk about it.     HPI:  Engaged in relationship different from his previous one. Taking Truvada to avoid acquiring HIV. Planned partner is HIV negative by his report.  Medications: Patient's Medications  New Prescriptions   No medications on file  Previous Medications   EMTRICITABINE-TENOFOVIR (TRUVADA) 200-300 MG TABLET    One daily to prevent HIV infection   EPINEPHRINE 0.3 MG/0.3 ML IJ SOAJ INJECTION    Inject 0.47m into the muscle as needed for allergic reaction   HYDROCODONE-ACETAMINOPHEN (NORCO) 7.5-325 MG TABLET    Take one tablet every 4 hours as needed for pain   HYDROCORTISONE (PROCTOSOL HC) 2.5 % RECTAL CREAM    Apply up to 3 times daily for hemorrhoid relief   LORAZEPAM (ATIVAN) 1 MG TABLET    Take 1 tablet (1 mg total) by mouth 2 (two) times daily as needed. for anxiety   MONTELUKAST (SINGULAIR) 10 MG TABLET    TAKE 1 TABLET(10 MG) BY MOUTH AT BEDTIME   SILDENAFIL (VIAGRA) 50 MG TABLET    Take one tablet about an hour prior to intercourse  Modified Medications   No medications on file  Discontinued Medications   HYDROCODONE-ACETAMINOPHEN (NORCO) 7.5-325 MG TABLET    Take one tablet every 4 hours as needed for pain   LORAZEPAM (ATIVAN) 1 MG TABLET    Take 1 tablet (1 mg total) by mouth 2 (two) times daily as needed. for anxiety    Review of Systems  Constitutional: Positive for fatigue. Negative for activity change, appetite change, diaphoresis, fever and unexpected weight change.  HENT: Negative.   Eyes: Positive for visual disturbance.    Respiratory: Negative.   Cardiovascular: Negative for chest pain, palpitations and leg swelling.  Gastrointestinal: Negative for abdominal distention, abdominal pain, constipation, diarrhea and nausea.       History of Bleeding hemorrhoid in May 2017  Endocrine: Negative.   Genitourinary: Negative for difficulty urinating, dysuria, flank pain and hematuria.       History of BPH. Hesitation on initiation of urination.  Musculoskeletal:       Discomfort in the left wrist that radiates up the left arm.  Skin: Negative.   Allergic/Immunologic: Negative.   Neurological: Negative.   Hematological: Negative.   Psychiatric/Behavioral: Negative.     Vitals:   05/19/16 1528  BP: 130/82  Pulse: 80  Temp: 97.7 F (36.5 C)  TempSrc: Oral  SpO2: 96%  Weight: 183 lb (83 kg)  Height: 6' (1.829 m)   Body mass index is 24.82 kg/m. Wt Readings from Last 3 Encounters:  05/19/16 183 lb (83 kg)  12/23/15 182 lb (82.6 kg)  08/07/15 190 lb (86.2 kg)      Physical Exam  Constitutional: He is oriented to person, place, and time. He appears well-developed and well-nourished. No distress.  HENT:  Right Ear: External ear normal.  Left Ear: External ear normal.  Nose: Nose normal.  Mouth/Throat: Oropharynx is clear and moist. No oropharyngeal exudate.  Eyes: Conjunctivae and EOM are normal. Pupils are equal, round, and reactive to light.  Neck: No JVD present. No tracheal deviation present. No thyromegaly present.  Cardiovascular: Normal rate, regular rhythm, normal heart sounds and intact distal pulses.  Exam reveals no gallop and no friction rub.   No murmur heard. Pulmonary/Chest: No respiratory distress. He has no wheezes. He has no rales. He exhibits no tenderness.  Abdominal: He exhibits no distension and no mass. There is no tenderness.  Genitourinary: Prostate normal and penis normal. Rectal exam shows guaiac negative stool.  Musculoskeletal: Normal range of motion. He exhibits  tenderness (left wrist on palpation). He exhibits no edema.  Lymphadenopathy:    He has no cervical adenopathy.  Neurological: He is alert and oriented to person, place, and time. He has normal reflexes. No cranial nerve deficit. Coordination normal.  Skin: No rash noted. No erythema. No pallor.  Psychiatric: He has a normal mood and affect. His behavior is normal. Judgment and thought content normal.    Labs reviewed: Lab Summary Latest Ref Rng & Units 08/07/2015  Hemoglobin 12.6 - 17.7 g/dL 13.7  Hematocrit 37.5 - 51.0 % 40.4  White count 3.4 - 10.8 x10E3/uL 5.2  Platelet count 150 - 379 x10E3/uL 204  Sodium 134 - 144 mmol/L 140  Potassium 3.5 - 5.2 mmol/L 4.4  Calcium 8.7 - 10.2 mg/dL 9.3  Phosphorus - (None)  Creatinine 0.76 - 1.27 mg/dL 0.88  AST 0 - 40 IU/L 20  Alk Phos 39 - 117 IU/L 44  Bilirubin 0.0 - 1.2 mg/dL 0.3  Glucose 65 - 99 mg/dL 103(H)  Cholesterol - (None)  HDL cholesterol >39 mg/dL 76  Triglycerides 0 - 149 mg/dL 65  LDL Direct - (None)  LDL Calc 0 - 99 mg/dL 124(H)  Total protein - (None)  Albumin 3.5 - 5.5 g/dL 4.1  Some recent data might be hidden   Lab Results  Component Value Date   TSH 2.030 05/10/2014   Lab Results  Component Value Date   BUN 16 08/07/2015   BUN 15 05/10/2014   BUN 13 06/07/2013   No results found for: HGBA1C  Assessment/Plan  Lengthy counseling regarding protection from acquiring disease from sex.  1. HIV exposure - HIV antibody (with reflex)  2. Erectile dysfunction, unspecified erectile dysfunction type  3. Need for hepatitis C screening test - Hep C Antibody  4. Exposure to hepatitis B - Hep B Surface Antibody

## 2016-05-19 NOTE — Telephone Encounter (Signed)
PA received for Viagra 50 MG tab  Per standing order from Dr. Chilton SiGreen. Do not complete PA for Viagra, pt will have to pay out of pocket  Pt is aware.

## 2016-05-20 LAB — HEPATITIS B SURFACE ANTIBODY,QUALITATIVE: HEP B S AB: POSITIVE — AB

## 2016-05-20 LAB — HEPATITIS C ANTIBODY: HCV Ab: NEGATIVE

## 2016-05-20 LAB — HIV ANTIBODY (ROUTINE TESTING W REFLEX): HIV 1&2 Ab, 4th Generation: NONREACTIVE

## 2016-05-21 ENCOUNTER — Telehealth: Payer: Self-pay

## 2016-05-21 ENCOUNTER — Other Ambulatory Visit: Payer: Self-pay

## 2016-05-21 NOTE — Telephone Encounter (Signed)
Incoming fax received from [facility] mail order company. Fax contained patient information request, provider information request, and clinical information request.  I called patient to confirm that he requested medication from this company, patient responded yes.   Form completed, attached insurance card and allergy list. Form was placed in Dr.Green's review and sign folder and then to be faxed.

## 2016-05-22 ENCOUNTER — Encounter: Payer: Self-pay | Admitting: Internal Medicine

## 2016-05-26 ENCOUNTER — Other Ambulatory Visit: Payer: Self-pay | Admitting: *Deleted

## 2016-05-26 DIAGNOSIS — Z206 Contact with and (suspected) exposure to human immunodeficiency virus [HIV]: Secondary | ICD-10-CM

## 2016-05-26 MED ORDER — EMTRICITABINE-TENOFOVIR DF 200-300 MG PO TABS: ORAL_TABLET | ORAL | 1 refills | Status: DC

## 2016-05-26 NOTE — Telephone Encounter (Signed)
Patient requested to be sent to [facility].  

## 2016-06-25 ENCOUNTER — Other Ambulatory Visit: Payer: Self-pay

## 2016-06-25 DIAGNOSIS — M542 Cervicalgia: Secondary | ICD-10-CM

## 2016-06-25 DIAGNOSIS — F411 Generalized anxiety disorder: Secondary | ICD-10-CM

## 2016-06-25 MED ORDER — HYDROCODONE-ACETAMINOPHEN 7.5-325 MG PO TABS: ORAL_TABLET | ORAL | 0 refills | Status: DC

## 2016-06-25 MED ORDER — LORAZEPAM 1 MG PO TABS: 1.0000 mg | ORAL_TABLET | Freq: Two times a day (BID) | ORAL | 0 refills | Status: DC | PRN

## 2016-07-07 ENCOUNTER — Ambulatory Visit: Payer: 59 | Admitting: Internal Medicine

## 2016-07-27 ENCOUNTER — Other Ambulatory Visit: Payer: Self-pay | Admitting: *Deleted

## 2016-07-27 DIAGNOSIS — F411 Generalized anxiety disorder: Secondary | ICD-10-CM

## 2016-07-27 DIAGNOSIS — M542 Cervicalgia: Secondary | ICD-10-CM

## 2016-07-27 MED ORDER — LORAZEPAM 1 MG PO TABS: 1.0000 mg | ORAL_TABLET | Freq: Two times a day (BID) | ORAL | 0 refills | Status: DC | PRN

## 2016-07-27 MED ORDER — HYDROCODONE-ACETAMINOPHEN 7.5-325 MG PO TABS: ORAL_TABLET | ORAL | 0 refills | Status: DC

## 2016-07-27 NOTE — Telephone Encounter (Signed)
Patient informed rx signed and ready for pickup  

## 2016-07-27 NOTE — Telephone Encounter (Signed)
Patient requested and will pick up 

## 2016-08-18 ENCOUNTER — Telehealth: Payer: Self-pay | Admitting: *Deleted

## 2016-08-18 NOTE — Telephone Encounter (Signed)
1. Patient called and wanted to know if he should go ahead and have his labwork repeated due to being on Truvada. He stated that he thought he needed his bloodwork repeated. I reviewed labs and OV note but I don't see anything about repeating bloodwork. Please Advise.   2. Patient also wants to know if he can get a #90 day supply of Truvada called into mail order since he has been on it awhile now. Please Advise.

## 2016-08-18 NOTE — Telephone Encounter (Signed)
Spoke with Synetta FailAnita, pt would like to wait on [facility] response when he returns

## 2016-08-26 ENCOUNTER — Other Ambulatory Visit: Payer: Self-pay

## 2016-08-26 ENCOUNTER — Other Ambulatory Visit: Payer: 59

## 2016-08-26 ENCOUNTER — Other Ambulatory Visit: Payer: Self-pay | Admitting: *Deleted

## 2016-08-26 DIAGNOSIS — Z114 Encounter for screening for human immunodeficiency virus [HIV]: Secondary | ICD-10-CM

## 2016-08-26 DIAGNOSIS — M542 Cervicalgia: Secondary | ICD-10-CM

## 2016-08-26 DIAGNOSIS — F411 Generalized anxiety disorder: Secondary | ICD-10-CM

## 2016-08-26 MED ORDER — HYDROCODONE-ACETAMINOPHEN 7.5-325 MG PO TABS: ORAL_TABLET | ORAL | 0 refills | Status: DC

## 2016-08-26 MED ORDER — LORAZEPAM 1 MG PO TABS: 1.0000 mg | ORAL_TABLET | Freq: Two times a day (BID) | ORAL | 0 refills | Status: DC | PRN

## 2016-08-26 NOTE — Telephone Encounter (Signed)
Patient requested and will pick up 

## 2016-08-27 LAB — HIV ANTIBODY (ROUTINE TESTING W REFLEX): HIV: NONREACTIVE

## 2016-09-21 ENCOUNTER — Telehealth: Payer: Self-pay

## 2016-09-21 DIAGNOSIS — M542 Cervicalgia: Secondary | ICD-10-CM

## 2016-09-21 DIAGNOSIS — F411 Generalized anxiety disorder: Secondary | ICD-10-CM

## 2016-09-21 MED ORDER — LORAZEPAM 1 MG PO TABS: 1.0000 mg | ORAL_TABLET | Freq: Two times a day (BID) | ORAL | 0 refills | Status: DC | PRN

## 2016-09-21 MED ORDER — HYDROCODONE-ACETAMINOPHEN 7.5-325 MG PO TABS: ORAL_TABLET | ORAL | 0 refills | Status: DC

## 2016-09-21 NOTE — Telephone Encounter (Signed)
Patient aware rx's approved and ready for pick-up

## 2016-09-21 NOTE — Telephone Encounter (Signed)
Patient is leaving to go out of town for the holiday (July 4th) tonight and would like to know if Dr.Reed will release his rx 2 days early  Norco (last filled 08/26/16) and Lorazepam (last filled 08/26/16)   Patient did not have a pending appointment scheduled at the time of call, patient scheduled yearly exam with Dr.Reed in August (next available).   Please advise

## 2016-09-21 NOTE — Telephone Encounter (Signed)
Ok to release prescriptions today (early) this one time, but this is not typically the practice.  Pt must keep his appt in August.

## 2016-10-20 ENCOUNTER — Other Ambulatory Visit: Payer: Self-pay

## 2016-10-20 DIAGNOSIS — F411 Generalized anxiety disorder: Secondary | ICD-10-CM

## 2016-10-20 DIAGNOSIS — M542 Cervicalgia: Secondary | ICD-10-CM

## 2016-10-20 MED ORDER — LORAZEPAM 1 MG PO TABS: 1.0000 mg | ORAL_TABLET | Freq: Two times a day (BID) | ORAL | 0 refills | Status: DC | PRN

## 2016-10-20 MED ORDER — HYDROCODONE-ACETAMINOPHEN 7.5-325 MG PO TABS: ORAL_TABLET | ORAL | 0 refills | Status: DC

## 2016-10-30 ENCOUNTER — Other Ambulatory Visit: Payer: Self-pay | Admitting: Internal Medicine

## 2016-10-30 DIAGNOSIS — Z206 Contact with and (suspected) exposure to human immunodeficiency virus [HIV]: Secondary | ICD-10-CM

## 2016-11-02 ENCOUNTER — Ambulatory Visit (INDEPENDENT_AMBULATORY_CARE_PROVIDER_SITE_OTHER): Payer: 59 | Admitting: Internal Medicine

## 2016-11-02 ENCOUNTER — Encounter: Payer: Self-pay | Admitting: Internal Medicine

## 2016-11-02 VITALS — BP 100/60 | HR 83 | Temp 98.4°F | Ht 72.0 in | Wt 176.0 lb

## 2016-11-02 DIAGNOSIS — M542 Cervicalgia: Secondary | ICD-10-CM | POA: Diagnosis not present

## 2016-11-02 DIAGNOSIS — R3911 Hesitancy of micturition: Secondary | ICD-10-CM | POA: Diagnosis not present

## 2016-11-02 DIAGNOSIS — G4733 Obstructive sleep apnea (adult) (pediatric): Secondary | ICD-10-CM

## 2016-11-02 DIAGNOSIS — Z0000 Encounter for general adult medical examination without abnormal findings: Secondary | ICD-10-CM | POA: Diagnosis not present

## 2016-11-02 DIAGNOSIS — R739 Hyperglycemia, unspecified: Secondary | ICD-10-CM

## 2016-11-02 DIAGNOSIS — E041 Nontoxic single thyroid nodule: Secondary | ICD-10-CM

## 2016-11-02 DIAGNOSIS — F411 Generalized anxiety disorder: Secondary | ICD-10-CM

## 2016-11-02 DIAGNOSIS — N401 Enlarged prostate with lower urinary tract symptoms: Secondary | ICD-10-CM

## 2016-11-02 DIAGNOSIS — Z206 Contact with and (suspected) exposure to human immunodeficiency virus [HIV]: Secondary | ICD-10-CM

## 2016-11-02 LAB — CBC WITH DIFFERENTIAL/PLATELET
Basophils Absolute: 55 cells/uL (ref 0–200)
Basophils Relative: 1 %
Eosinophils Absolute: 55 cells/uL (ref 15–500)
Eosinophils Relative: 1 %
HCT: 41.6 % (ref 38.5–50.0)
Hemoglobin: 13.8 g/dL (ref 13.2–17.1)
Lymphocytes Relative: 29 %
Lymphs Abs: 1595 cells/uL (ref 850–3900)
MCH: 29.4 pg (ref 27.0–33.0)
MCHC: 33.2 g/dL (ref 32.0–36.0)
MCV: 88.7 fL (ref 80.0–100.0)
MPV: 10.6 fL (ref 7.5–12.5)
Monocytes Absolute: 440 cells/uL (ref 200–950)
Monocytes Relative: 8 %
Neutro Abs: 3355 cells/uL (ref 1500–7800)
Neutrophils Relative %: 61 %
Platelets: 208 10*3/uL (ref 140–400)
RBC: 4.69 MIL/uL (ref 4.20–5.80)
RDW: 14.2 % (ref 11.0–15.0)
WBC: 5.5 10*3/uL (ref 3.8–10.8)

## 2016-11-02 LAB — TSH: TSH: 0.9 mIU/L (ref 0.40–4.50)

## 2016-11-02 NOTE — Progress Notes (Addendum)
Location:  [facility] clinic Provider:  Shemeca Lukasik L. Renato Gails, D.O., C.M.D.  Goals of Care:  Advanced Directives 11/02/2016  Does Patient Have a Medical Advance Directive? Yes  Type of Estate agent of [place];Living will  Does patient want to make changes to medical advance directive? -  Copy of Healthcare Power of Attorney in Chart? No - copy requested  Would patient like information on creating a medical advance directive? -    Chief Complaint  Patient presents with  . Annual Exam    CPE, transfer from Dr. Chilton Si, discuss medications    HPI: Patient is a 56 y.o. male seen today for annual exam and medical management of chronic diseases.    H/o HIV exposure:  He is a gay man.  Had negative HIV screen, positive hep B sAg and negative Hep C ab 08/27/16.  He had hepatitis B in 1984.  He's been taking truvada as pre-exposure prophylaxis due to a past partner.  He was frightened by the death of most of his friends when HIV became well known.  He's been seeing Dr. Chilton Si since he was a young adult.  He reports that he now has a different partner who is partially deaf.    Doing well.  Here for his physical and has difficulty with "crinkly neck."  Sounds like he's got aluminum foil in there when he moves his head.  Will wake up with pain in the neck.  None in his hands.  His partner is partially deaf and he signs so that keeps his hands lubricated.  Sleeps w/o a pillow and thinks that has helped considerably.  Bengay does nothing.  Practices yoga twice a week and has some limitation.  Doing yoga three years three times a week.  CT c spine reviewed from 01/30/09:  Showed degenerative changes within the cervical spine (C5-6, C6-7) with anterior osteophytes and bilateral forminal narrowing.    Having some family issues and wonders about counseling.    Hyperlipidemia:  HDL was 76, LDL 124.  At goal, eats well.   BPH:  Had been on medication at one time.  Had a side effect.  Off for a number  of years.  Had slow stream starting--still has.    Prediabetes:  Had at one time with sweet tea.  Drinks water instead and it's been fine since.  Likes water now.    OSA:  Uses CPAP religiously with nasal pillows.  Follows with Dr. Vickey Huger.  Works well.  Does not drink alcohol.     Only gets gerd for a week and then it will go away.  Comes and goes.  No documentation on file for hiatal hernia since EPIC so will remove.  Allergic rhinitis: avoids cats.  Some wheezing with heavy ozone.  Takes singulair at hs.   Finishing course of doxycycline for folliculitis on his back from dermatology.  Had a "doohicky" on his finger that was MRSA.  They were raised and itchy.  He thought it was sun-related himself.  Itchiness is gone.    Erectile dysfunction:  Not been an issue b/c not using it.  Keeps viagra on hand "in case".    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    Had benign thyroid nodule on [place].    Had childhood asthma.  No problems since.    Past Medical History:  Diagnosis Date  . Anxiety   . Asthma   . BPH (benign prostatic hyperplasia)   . Cervicalgia  degenerative changes and foraminal stenoses  . Chest pain, unspecified   . Depression   . Family history of colonic polyps    Father with benign colon polyp  . Headache, tension type, chronic 01/2012  . Hepatitis B antibody positive 1984  . Hiatal hernia 09/19/10  . Hyperglycemia    104 on 10/10/10  . Hyperlipidemia   . Obstructive sleep apnea (adult) (pediatric)    using CPAP  . Other malaise and fatigue   . Supraventricular premature beats    noted on EKG 05/18/12  . Wrist pain, left     Past Surgical History:  Procedure Laterality Date  . COLON SURGERY  09/17/2008   poylps/hemorriods  . WISDOM TOOTH EXTRACTION  03/2009    Allergies  Allergen Reactions  . Pyridoxine Other (See Comments)    Made him crazy  . Augmentin [Amoxicillin-Pot Clavulanate]   . Bee Venom   . Codeine   . Sedapap  [Butalbital-Acetaminophen]   . Skelaxin [Metaxalone]     Allergies as of 11/02/2016      Reactions   Pyridoxine Other (See Comments)   Made him crazy   Augmentin [amoxicillin-pot Clavulanate]    Bee Venom    Codeine    Sedapap [butalbital-acetaminophen]    Skelaxin [metaxalone]       Medication List       Accurate as of 11/02/16  1:54 PM. Always use your most recent med list.          emtricitabine-tenofovir 200-300 MG tablet Commonly known as:  TRUVADA One daily to prevent HIV infection   EPINEPHrine 0.3 mg/0.3 mL Soaj injection Commonly known as:  EPI-PEN Inject 0.173ml into the muscle as needed for allergic reaction   HYDROcodone-acetaminophen 7.5-325 MG tablet Commonly known as:  NORCO Take one tablet every 4 hours as needed for pain   LORazepam 1 MG tablet Commonly known as:  ATIVAN Take 1 tablet (1 mg total) by mouth 2 (two) times daily as needed. for anxiety   montelukast 10 MG tablet Commonly known as:  SINGULAIR TAKE 1 TABLET(10 MG) BY MOUTH AT BEDTIME   sildenafil 20 MG tablet Commonly known as:  REVATIO Take 2-5 tablets prior to intercourse     uses the ativan at bedtime and the hydrocodone for his neck  Review of Systems:  Review of Systems  Constitutional: Negative for chills, fever and malaise/fatigue.  HENT: Positive for hearing loss. Negative for congestion.   Eyes: Positive for blurred vision.       Wears glasses for reading, driving, watching tv  Respiratory: Negative for cough and shortness of breath.   Cardiovascular: Negative for chest pain, palpitations and leg swelling.  Gastrointestinal: Negative for abdominal pain, blood in stool, constipation, diarrhea and melena.  Genitourinary: Negative for dysuria, frequency and urgency.       Difficulty starting stream  Musculoskeletal: Positive for myalgias and neck pain. Negative for falls and joint pain.       Left leg generally sore, neck hurts at base especially at night  Skin: Negative for  itching and rash.  Neurological: Negative for dizziness, tingling, tremors, sensory change, speech change, focal weakness, seizures, loss of consciousness, weakness and headaches.  Endo/Heme/Allergies: Does not bruise/bleed easily.  Psychiatric/Behavioral: Negative for depression, memory loss and suicidal ideas. The patient is nervous/anxious. The patient does not have insomnia.     Health Maintenance  Topic Date Due  . INFLUENZA VACCINE  10/21/2016  . COLONOSCOPY  09/18/2018  . TETANUS/TDAP  12/11/2025  .  Hepatitis C Screening  Completed  . HIV Screening  Completed    Physical Exam: Vitals:   11/02/16 1337  BP: 100/60  Pulse: 83  Temp: 98.4 F (36.9 C)  TempSrc: Oral  SpO2: 96%  Weight: 176 lb (79.8 kg)  Height: 6' (1.829 m)   Body mass index is 23.87 kg/m. Physical Exam  Constitutional: He is oriented to person, place, and time. He appears well-developed and well-nourished. No distress.  HENT:  Head: Normocephalic and atraumatic.  Right Ear: External ear normal.  Left Ear: External ear normal.  Nose: Nose normal.  Mouth/Throat: Oropharynx is clear and moist. No oropharyngeal exudate.  Eyes: Pupils are equal, round, and reactive to light. Conjunctivae and EOM are normal.  Neck: Normal range of motion. Neck supple. No JVD present.  Cardiovascular: Normal rate, regular rhythm, normal heart sounds and intact distal pulses.   Pulmonary/Chest: Effort normal and breath sounds normal. No respiratory distress.  Abdominal: Soft. Bowel sounds are normal. He exhibits no distension. There is no tenderness.  Musculoskeletal: Normal range of motion.  Lymphadenopathy:    He has no cervical adenopathy.  Neurological: He is alert and oriented to person, place, and time.  Skin: Skin is warm and dry. Capillary refill takes less than 2 seconds.  Psychiatric: He has a normal mood and affect.   Labs reviewed: Basic Metabolic Panel: No results for input(s): NA, K, CL, CO2, GLUCOSE, BUN,  CREATININE, CALCIUM, MG, PHOS, TSH in the last 8760 hours. Liver Function Tests: No results for input(s): AST, ALT, ALKPHOS, BILITOT, PROT, ALBUMIN in the last 8760 hours. No results for input(s): LIPASE, AMYLASE in the last 8760 hours. No results for input(s): AMMONIA in the last 8760 hours. CBC: No results for input(s): WBC, NEUTROABS, HGB, HCT, MCV, PLT in the last 8760 hours. Lipid Panel: No results for input(s): CHOL, HDL, LDLCALC, TRIG, CHOLHDL, LDLDIRECT in the last 8760 hours. No results found for: HGBA1C  no recent regular labs--needs  Assessment/Plan 1. History of exposure to HIV - requests retesting -has been taking truvada and was trying to decide if this needs to be ongoing--I'm not certain about this and need to look into it and get back to him -he is no longer with his partner who may have exposed him before - CBC with Differential/Platelet - Basic metabolic panel - Hepatic function panel - HIV antibody  2. Obstructive sleep apnea (adult) (pediatric) -ongoing, cont nasal cpap which he is faithful with using  3. Right thyroid nodule - was small and felt to be benign, check lab due to this nodule - TSH  4. Benign prostatic hyperplasia with urinary hesitancy -continues with some hesitancy, but no other active symptoms and did not tolerate medication well so opts to go w/o it  5. Anxiety state -continues on prn lorazepam--reports taking only at hs for sleep  6. Hyperglycemia - f/u labs due to h/o prediabetes - Hemoglobin A1c - Basic metabolic panel - Hepatic function panel  7. 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  Annual exam:  Performed today and history thoroughly reviewed as above.  Labs/tests ordered:   Orders Placed This Encounter  Procedures  . CBC with Differential/Platelet  .  Hemoglobin A1c  . TSH  . Basic metabolic panel    Order Specific Question:   Has the patient fasted?  Answer:   Yes  . Hepatic function panel  . HIV antibody   Next appt:  04/12/2017 for med mgt  Neyland Pettengill L. Rashi Giuliani, D.O. Geriatrics [facility] [address], [place] [address] Cell Phone (Mon-Fri 8am-5pm):  [phone] On Call:  [phone] & follow prompts after 5pm & weekends Office Phone:  [phone] Office Fax:  [phone]

## 2016-11-03 LAB — HEMOGLOBIN A1C
Hgb A1c MFr Bld: 5.3 % (ref <5.7)
Mean Plasma Glucose: 105 mg/dL

## 2016-11-03 LAB — HEPATIC FUNCTION PANEL
ALT: 24 U/L (ref 9–46)
AST: 25 U/L (ref 10–35)
Albumin: 4 g/dL (ref 3.6–5.1)
Alkaline Phosphatase: 64 U/L (ref 40–115)
Bilirubin, Direct: 0.1 mg/dL (ref <=0.2)
Indirect Bilirubin: 0.4 mg/dL (ref 0.2–1.2)
Total Bilirubin: 0.5 mg/dL (ref 0.2–1.2)
Total Protein: 6.2 g/dL (ref 6.1–8.1)

## 2016-11-03 LAB — BASIC METABOLIC PANEL
BUN: 14 mg/dL (ref 7–25)
CO2: 21 mmol/L (ref 20–32)
Calcium: 9.2 mg/dL (ref 8.6–10.3)
Chloride: 105 mmol/L (ref 98–110)
Creat: 1.01 mg/dL (ref 0.70–1.33)
Glucose, Bld: 95 mg/dL (ref 65–99)
Potassium: 4.5 mmol/L (ref 3.5–5.3)
Sodium: 139 mmol/L (ref 135–146)

## 2016-11-03 LAB — HIV ANTIBODY (ROUTINE TESTING W REFLEX): HIV 1&2 Ab, 4th Generation: NONREACTIVE

## 2016-11-04 ENCOUNTER — Encounter: Payer: Self-pay | Admitting: *Deleted

## 2016-11-05 ENCOUNTER — Telehealth: Payer: Self-pay

## 2016-11-05 DIAGNOSIS — Z206 Contact with and (suspected) exposure to human immunodeficiency virus [HIV]: Secondary | ICD-10-CM

## 2016-11-05 MED ORDER — EMTRICITABINE-TENOFOVIR DF 200-300 MG PO TABS: ORAL_TABLET | ORAL | 1 refills | Status: DC

## 2016-11-05 NOTE — Telephone Encounter (Signed)
Patient called requesting refill on Truvada

## 2016-11-16 ENCOUNTER — Telehealth: Payer: Self-pay

## 2016-11-16 NOTE — Telephone Encounter (Signed)
I spoke with patient to inform him of Dr. Ernest Mallick decision and pt then decided to tell me that he needed Rx early due to going out of town.   Per Dr. Reed-pt can pick up prescriptions on 11/20/16.   I spoke with patient and he verbalized understanding. He will pick Rx on Monday.

## 2016-11-16 NOTE — Telephone Encounter (Signed)
He can pick them up 8/31.

## 2016-11-16 NOTE — Telephone Encounter (Signed)
Patient called to request a refill on hydrocodone and lorazepam today. I explained that prescriptions were not due until 11/20/16. Pt stated that he wanted the Rx written today and he would hold them until 11/20/16, I tried to explain to patient that the rx could not be dated for before 11/20/16 but patient insisted that prescriptions be printed today.   Pt asked that I speak with Dr. Renato Gails to see she would release Rx early.   Please advise.

## 2016-11-20 ENCOUNTER — Other Ambulatory Visit: Payer: Self-pay | Admitting: *Deleted

## 2016-11-20 DIAGNOSIS — F411 Generalized anxiety disorder: Secondary | ICD-10-CM

## 2016-11-20 DIAGNOSIS — M542 Cervicalgia: Secondary | ICD-10-CM

## 2016-11-20 MED ORDER — HYDROCODONE-ACETAMINOPHEN 7.5-325 MG PO TABS: ORAL_TABLET | ORAL | 0 refills | Status: DC

## 2016-11-20 MED ORDER — LORAZEPAM 1 MG PO TABS: 1.0000 mg | ORAL_TABLET | Freq: Two times a day (BID) | ORAL | 0 refills | Status: DC | PRN

## 2016-11-20 NOTE — Telephone Encounter (Signed)
Reprinted Rx's due to placed under wrong signing provider. [place] Databse Checked.

## 2016-11-20 NOTE — Telephone Encounter (Signed)
Patient requested and will pick up [place] Database Checked.  

## 2016-12-21 ENCOUNTER — Encounter (INDEPENDENT_AMBULATORY_CARE_PROVIDER_SITE_OTHER): Payer: Self-pay

## 2016-12-21 ENCOUNTER — Encounter: Payer: Self-pay | Admitting: Neurology

## 2016-12-21 ENCOUNTER — Ambulatory Visit (INDEPENDENT_AMBULATORY_CARE_PROVIDER_SITE_OTHER): Payer: 59 | Admitting: Neurology

## 2016-12-21 VITALS — BP 118/75 | HR 64 | Ht 72.0 in | Wt 174.0 lb

## 2016-12-21 DIAGNOSIS — Z9989 Dependence on other enabling machines and devices: Secondary | ICD-10-CM

## 2016-12-21 DIAGNOSIS — G4733 Obstructive sleep apnea (adult) (pediatric): Secondary | ICD-10-CM

## 2016-12-21 NOTE — Progress Notes (Signed)
SLEEP MEDICINE CLINIC   Provider:  Melvyn Novas, M D  Referring Provider: Kimber Relic, MD Primary Care Physician:  [name], DO  Chief Complaint  Patient presents with  . Follow-up    pt states that everything is going well. new machine for about a year and its working well. DME: [employer] supplies from [facility] patient    HPI: Follow up for this 56 year old caucasian male, on CPAP. He reports his travel adventure in [place].   [name] just returned from a trip to [place], [name], with an average user time currently on days used of 8 hours and 40 minutes. He used to machine 18 out of 30 days again this was during his travel time- he used his old CPAP as his travel CPAP ( OSA treated since 2002). CPAP is set at 8:30 meter water with 3 cm EPR residual AHI is only 1.8 there are some higher air leaks probably attributable to his facial hair. That needs to be no change in settings. Epworth sleepiness score is 2 points fatigue severity 12 points also no concern from the side. I would like for him to continue using CPAP nightly.     Referral from Dr. Chilton Si for a sleep consultation.[name]  is a right-handed Caucasian gentleman who underwent a sleep study on 08-23-09 at [facility],  which documented an AHI of 22.3. His primary care physician had referred him after her the patient was to hold during a camping trip that fellow travelers had noticed him to snore loudly and have frequent apneas. He also has severe restless legs. He returned for a titration study on 11-22-09 and a respites left nasal pillow mask and medium size was used to. The patient was titrated to 8 cm water, at which pressure his AHI current of 0.0. He was able to sleep under 10 minutes under the pressure. Some snoring was still noted but to a much lower volume.  Bisacodyl has been compliant he using her CPAP ever since and for  the first time today I see her download.  He had been lost in followup also he tried to make an appointment with our sleep clinic.  The CPAP download dated 11-05-13 encompassed  90 days,  an average daily user time of 7 hours and 39 minutes , 100% compliance of 4 hours. The machine is set at 8 cm of water without EPR and his residual AHI is 1.6. In spite of being on CPAP, he cannot sleep in the same bed with his partner, reporting RLS.  It seems, he has not RLS, but PLM s, bothering his partner.  The patient works at night, goes to bed at 1.30 AM and once he puts his CPAP on, he is promptly asleep. He never needs an alarm clock to rise. Sleeps from 1.45 to 9.30 and mainly uninterrputed, only one nocturia.  Work starts at 2.30 PM.  He drinks sweet iced tea, mainly water.   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 He is 100% compliant with the use of CPAP over 4 hours nightly on a 30 day download obtained in office today. Average user time is 7 hours 59 minutes set pressure is 8 cm water without EPR and AHI is 1.9. He does have some air leaks but this is related to his facial  hair. Always some Epworth sleepiness score is endorsed at 0. Fatigue severity is endorsed at 0. The patient's partner sleeps in a different room actually on a different floor of the house. Was begun but the patient was still snoring and kicking a lot at night, so he is not able to bear witness to how the sleep habits have changed. A she uses a 58 year old ResMed machine and he would like to have a more trouble friendly option available lighter and perhaps quieter. The patient has noticed that the machine is noisier than it used to be. He would also like to obtain a car [employer] to be able to camp and uses CPAP machine. In addition he travels long distances and would like to have a CPAP machine that would be usable on a  Plane. He is a skier and likes to travel to  high altitude , may need a machine that could adjust to detect central apneas.  Interval history 12/23/2015, The pleasure of seeing Mr.  [name], today for a CPAP compliance visit his residual AHI is 1.78 cm water pressure without EPR and he has used the machine 100% of the last 30 days and each of those nights over 4 hours. His average user time is 8 hours and 7 minutes. No adjustments have to be made. He does have some higher air leaks but he is not bothered by it. He does like to use a nasal pillow. His Epworth sleepiness score is endorsed at 0,  The patient has a history of childhood asthma and was a smoker for many  ( 20 ?) years. Quit in 2001.   Review of Systems: Out of a complete 14 system review, the patient complains of only the following symptoms, and all other reviewed systems are negative.  tinnitus in the left, some fatigue related to shift work.   Epworth score zero  , Fatigue severity score zero , depression score 1.   Social History   Social History  . Marital status: Married    Spouse name: [name]  . Number of children: 0  . 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 Smoking status: Former Games developer  . Smokeless tobacco: Never Used  . Alcohol use Yes     Comment: rarely  . Drug use: No  . Sexual activity: Not on file   Other Topics Concern  . Not on file   Social History Narrative   Patient is married ([name]) and lives at home with his husband.   Patient has a college education.   Patient works full-time ([employer]).   Patient is right-handed.   Patient drinks one cup of coffee daily.    Family History  Problem Relation Age of Onset  . Heart disease Mother   . Hypertension Mother   . Diabetes Mother   . Depression Mother   . Cancer Father        2004 bladder cancer  . Alcohol abuse Father     Past Medical History:  Diagnosis Date  . Anxiety   . Asthma    . BPH (benign prostatic hyperplasia)   . Cervicalgia    degenerative changes and foraminal stenoses  . Chest pain, unspecified   . Depression   . Family history of colonic polyps    Father with benign colon polyp  . Headache, tension type, chronic 01/2012  . Hepatitis  B antibody positive 1984  . Hiatal hernia 09/19/10  . Hyperglycemia    104 on 10/10/10  . Hyperlipidemia   . Obstructive sleep apnea (adult) (pediatric)    using CPAP  . Other malaise and fatigue   . Supraventricular premature beats    noted on EKG 05/18/12  . Wrist pain, left     Past Surgical History:  Procedure Laterality Date  . COLON SURGERY  09/17/2008   poylps/hemorriods  . WISDOM TOOTH EXTRACTION  03/2009    Current Outpatient Prescriptions  Medication Sig Dispense Refill  . emtricitabine-tenofovir (TRUVADA) 200-300 MG tablet One daily to prevent HIV infection 90 tablet 1  . EPINEPHrine 0.3 mg/0.3 mL IJ SOAJ injection Inject 0.55ml into the muscle as needed for allergic reaction 2 Device 3  . HYDROcodone-acetaminophen (NORCO) 7.5-325 MG tablet Take one tablet every 4 hours as needed for pain 180 tablet 0  . LORazepam (ATIVAN) 1 MG tablet Take 1 tablet (1 mg total) by mouth 2 (two) times daily as needed. for anxiety 60 tablet 0  . montelukast (SINGULAIR) 10 MG tablet TAKE 1 TABLET(10 MG) BY MOUTH AT BEDTIME 30 tablet 2  . sildenafil (REVATIO) 20 MG tablet Take 2-5 tablets prior to intercourse 50 tablet 4   No current facility-administered medications for this visit.     Allergies as of 12/21/2016 - Review Complete 12/21/2016  Allergen Reaction Noted  . Bee venom Anaphylaxis 02/06/2015  . Codeine Other (See Comments) 06/20/2014  . Pyridoxine Other (See Comments) 06/20/2014  . Skelaxin [metaxalone] Other (See Comments) 06/06/2013  . Augmentin [amoxicillin-pot clavulanate] Nausea And Vomiting 06/06/2013  . Sedapap [butalbital-acetaminophen] Other (See Comments) 06/06/2013    Vitals: BP 118/75   Pulse  64   Ht 6' (1.829 m)   Wt 174 lb (78.9 kg)   BMI 23.60 kg/m  Last Weight:  Wt Readings from Last 1 Encounters:  12/21/16 174 lb (78.9 kg)       Last Height:   Ht Readings from Last 1 Encounters:  12/21/16 6' (1.829 m)    Physical exam:  General: The patient is awake, alert and appears not in acute distress.  The patient is well groomed. Head: Normocephalic, atraumatic. Neck is supple. Mallampati 2   neck circumference: 15 inches . Nasal airflow restricted, mustache, TMJ is evident. Retrognathia is not seen.  Cardiovascular: Regular rate and rhythm , without  murmurs or carotid bruit, and without distended neck veins. Respiratory: Lungs are clear to auscultation. Skin:  Without evidence of edema, or rash Trunk: BMI is elevated and patient has normal posture.  Neurologic exam : The patient is awake and alert, oriented to place and time.   Memory subjective described as intact.   Cranial nerves: Pupils are equal and briskly reactive to light. Funduscopic exam without evidence of pallor or edema.  Extraocular movements  in vertical and horizontal planes intact and without nystagmus. Visual fields by finger perimetry are intact. Hearing to finger rub intact. Facial sensation intact to fine touch. Facial motor strength is symmetric and tongue and uvula move midline. Motor exam: Normal tone ,muscle bulk and symmetric ,strength in all extremities. Sensory:  Fine touch, pinprick and vibration were tested in all extremities.   Assessment:  After physical and neurologic examination, review of laboratory studies, imaging, neurophysiology testing and pre-existing records, assessment is   1) OSA - very well controlled on CPAP . The patient was advised of the nature of the diagnosed sleep disorder , the treatment options and risks for  general a health and wellness arising from not treating the condition. Visit duration was 20 minutes.   Plan:     RV once a year . CPAP compliance  .  Melvyn Novas, MD      Melvyn Novas MD  12/21/2016

## 2016-12-25 ENCOUNTER — Other Ambulatory Visit: Payer: Self-pay | Admitting: *Deleted

## 2016-12-25 DIAGNOSIS — M542 Cervicalgia: Secondary | ICD-10-CM

## 2016-12-25 DIAGNOSIS — F411 Generalized anxiety disorder: Secondary | ICD-10-CM

## 2016-12-25 MED ORDER — HYDROCODONE-ACETAMINOPHEN 7.5-325 MG PO TABS: ORAL_TABLET | ORAL | 0 refills | Status: DC

## 2016-12-25 MED ORDER — LORAZEPAM 1 MG PO TABS: 1.0000 mg | ORAL_TABLET | Freq: Two times a day (BID) | ORAL | 0 refills | Status: DC | PRN

## 2016-12-25 NOTE — Telephone Encounter (Signed)
Patient requested and will pick up NCCSRS Database Verified.  

## 2017-01-25 ENCOUNTER — Other Ambulatory Visit: Payer: Self-pay

## 2017-01-25 DIAGNOSIS — M542 Cervicalgia: Secondary | ICD-10-CM

## 2017-01-25 DIAGNOSIS — F411 Generalized anxiety disorder: Secondary | ICD-10-CM

## 2017-01-25 MED ORDER — LORAZEPAM 1 MG PO TABS: 1.0000 mg | ORAL_TABLET | Freq: Two times a day (BID) | ORAL | 0 refills | Status: DC | PRN

## 2017-01-25 MED ORDER — HYDROCODONE-ACETAMINOPHEN 7.5-325 MG PO TABS: ORAL_TABLET | ORAL | 0 refills | Status: DC

## 2017-01-25 NOTE — Telephone Encounter (Signed)
[place] Narx checked 

## 2017-02-24 ENCOUNTER — Other Ambulatory Visit: Payer: Self-pay | Admitting: *Deleted

## 2017-02-24 DIAGNOSIS — F411 Generalized anxiety disorder: Secondary | ICD-10-CM

## 2017-02-24 DIAGNOSIS — M542 Cervicalgia: Secondary | ICD-10-CM

## 2017-02-24 MED ORDER — HYDROCODONE-ACETAMINOPHEN 7.5-325 MG PO TABS: ORAL_TABLET | ORAL | 0 refills | Status: DC

## 2017-02-24 MED ORDER — LORAZEPAM 1 MG PO TABS: 1.0000 mg | ORAL_TABLET | Freq: Two times a day (BID) | ORAL | 0 refills | Status: DC | PRN

## 2017-02-24 NOTE — Telephone Encounter (Signed)
Patient requested and will pick up NCCSRS Database Verified.  

## 2017-03-26 ENCOUNTER — Other Ambulatory Visit: Payer: Self-pay | Admitting: *Deleted

## 2017-03-26 DIAGNOSIS — F411 Generalized anxiety disorder: Secondary | ICD-10-CM

## 2017-03-26 DIAGNOSIS — M542 Cervicalgia: Secondary | ICD-10-CM

## 2017-03-26 MED ORDER — HYDROCODONE-ACETAMINOPHEN 7.5-325 MG PO TABS: ORAL_TABLET | ORAL | 0 refills | Status: DC

## 2017-03-26 MED ORDER — LORAZEPAM 1 MG PO TABS: 1.0000 mg | ORAL_TABLET | Freq: Two times a day (BID) | ORAL | 0 refills | Status: DC | PRN

## 2017-03-26 NOTE — Telephone Encounter (Signed)
Patient called and requested refill. NCCSRS Database Verified Pharmacy Confirmed Pended Rx and sent to Dr. Renato Gailseed for approval.

## 2017-04-12 ENCOUNTER — Ambulatory Visit (INDEPENDENT_AMBULATORY_CARE_PROVIDER_SITE_OTHER): Payer: 59 | Admitting: Internal Medicine

## 2017-04-12 ENCOUNTER — Encounter: Payer: Self-pay | Admitting: Internal Medicine

## 2017-04-12 VITALS — BP 120/70 | HR 84 | Temp 98.0°F | Wt 182.0 lb

## 2017-04-12 DIAGNOSIS — Z206 Contact with and (suspected) exposure to human immunodeficiency virus [HIV]: Secondary | ICD-10-CM

## 2017-04-12 DIAGNOSIS — Z13220 Encounter for screening for lipoid disorders: Secondary | ICD-10-CM

## 2017-04-12 DIAGNOSIS — G4733 Obstructive sleep apnea (adult) (pediatric): Secondary | ICD-10-CM | POA: Diagnosis not present

## 2017-04-12 DIAGNOSIS — Z7252 High risk homosexual behavior: Secondary | ICD-10-CM | POA: Diagnosis not present

## 2017-04-12 DIAGNOSIS — F411 Generalized anxiety disorder: Secondary | ICD-10-CM | POA: Diagnosis not present

## 2017-04-12 DIAGNOSIS — M542 Cervicalgia: Secondary | ICD-10-CM

## 2017-04-12 NOTE — Progress Notes (Signed)
[facility] Provider:  Alyla Pietila L. Renato Gailseed, D.O., C.M.D.  Code Status: full code Goals of Care:  Advanced Directives 11/02/2016  Does Patient Have a Medical Advance Directive? Yes  Type of Estate agentAdvance Directive Healthcare Power of Bright[place]orney;Living will  Does patient want to make changes to medical advance directive? -  Copy of Healthcare Power of Attorney in Chart? No - copy requested  Would patient like information on creating a medical advance directive? -   Chief Complaint  Patient presents with  . Medical Management of Chronic Issues    4mth follow-up    HPI: Patient is a 57 y.o. male seen today for medical management of chronic diseases.    Nothing's new except he's going skiing alone to have fun by himself.  He's been exercising in preparation.    No breathing issues.  Has not really been using singulair in quite a while.  Seems to be seasonal end of summer first of fall (suspect mold from a certain leaf)--had severe childhood asthma.  Using his CPAP for sleep apnea and doing well.  Has avoided a sinus infection two years in a row also.    He has opted to continue on truvada as preventative.  No concerns in this arena and wants f/u HIV test.    Neck pain:  Neck is not happy.  If he turns his neck too far, his pain is horrible.  This is why he's using norco when severe.  Sees a massage therapist every 2 mos.     Anxiety:  Takes his ativan only at bedtime to ease out the chaos of the day.  Does a lot of volunteer work.    Had flu shot in October.  Past Medical History:  Diagnosis Date  . Anxiety   . Asthma   . BPH (benign prostatic hyperplasia)   . Cervicalgia    degenerative changes and foraminal stenoses  . Chest pain, unspecified   . Depression   . Family history of colonic polyps    Father with benign colon polyp  . Headache, tension type, chronic 01/2012  . Hepatitis B antibody positive 1984  . Hiatal hernia 09/19/10  . Hyperglycemia    104 on 10/10/10  .  Hyperlipidemia   . Obstructive sleep apnea (adult) (pediatric)    using CPAP  . Other malaise and fatigue   . Supraventricular premature beats    noted on EKG 05/18/12  . Wrist pain, left     Past Surgical History:  Procedure Laterality Date  . COLON SURGERY  09/17/2008   poylps/hemorriods  . WISDOM TOOTH EXTRACTION  03/2009    Allergies  Allergen Reactions  . Bee Venom Anaphylaxis  . Codeine Other (See Comments)  . Pyridoxine Other (See Comments)    Made him crazy  . Skelaxin [Metaxalone] Other (See Comments)  . Augmentin [Amoxicillin-Pot Clavulanate] Nausea And Vomiting  . Sedapap [Butalbital-Acetaminophen] Other (See Comments)    Outpatient Encounter Medications as of 04/12/2017  Medication Sig  . emtricitabine-tenofovir (TRUVADA) 200-300 MG tablet One daily to prevent HIV infection  . EPINEPHrine 0.3 mg/0.3 mL IJ SOAJ injection Inject 0.663ml into the muscle as needed for allergic reaction  . HYDROcodone-acetaminophen (NORCO) 7.5-325 MG tablet Take one tablet every 4 hours as needed for pain  . LORazepam (ATIVAN) 1 MG tablet Take 1 tablet (1 mg total) by mouth 2 (two) times daily as needed. for anxiety  . montelukast (SINGULAIR) 10 MG tablet TAKE 1 TABLET(10 MG) BY MOUTH AT BEDTIME  .  sildenafil (REVATIO) 20 MG tablet Take 2-5 tablets prior to intercourse   No facility-administered encounter medications on file as of 04/12/2017.     Review of Systems:  Review of Systems  Constitutional: Negative for chills, fever and malaise/fatigue.  HENT: Negative for congestion and hearing loss.   Eyes: Negative for blurred vision.  Respiratory: Negative for shortness of breath.   Cardiovascular: Negative for chest pain, palpitations and leg swelling.  Gastrointestinal: Negative for abdominal pain, blood in stool, constipation, heartburn and melena.  Genitourinary: Negative for dysuria, frequency and urgency.       No rashes or discharge  Musculoskeletal: Positive for neck pain.  Negative for falls and joint pain.  Skin: Negative for itching and rash.  Neurological: Negative for dizziness, focal weakness, weakness and headaches.  Endo/Heme/Allergies: Does not bruise/bleed easily.  Psychiatric/Behavioral: Negative for depression and memory loss. The patient is nervous/anxious.        Anxiety at bedtime    Health Maintenance  Topic Date Due  . INFLUENZA VACCINE  10/21/2016  . COLONOSCOPY  09/18/2018  . TETANUS/TDAP  12/11/2025  . Hepatitis C Screening  Completed  . HIV Screening  Completed    Physical Exam: Vitals:   04/12/17 1301  BP: 120/70  Pulse: 84  Temp: 98 F (36.7 C)  TempSrc: Oral  SpO2: 94%  Weight: 182 lb (82.6 kg)   Body mass index is 24.68 kg/m. Physical Exam  Constitutional: He is oriented to person, place, and time. He appears well-developed and well-nourished. No distress.  Cardiovascular: Normal rate, regular rhythm, normal heart sounds and intact distal pulses.  Pulmonary/Chest: Effort normal and breath sounds normal. No respiratory distress.  Abdominal: Soft. Bowel sounds are normal.  Musculoskeletal: Normal range of motion.  Decreased ROM of neck with tenderness of SCMs  Neurological: He is alert and oriented to person, place, and time. No cranial nerve deficit.  Skin: Skin is warm and dry. Capillary refill takes less than 2 seconds.  Psychiatric: He has a normal mood and affect. His behavior is normal. Judgment and thought content normal.    Labs reviewed: Basic Metabolic Panel: Recent Labs    11/02/16 1512  NA 139  K 4.5  CL 105  CO2 21  GLUCOSE 95  BUN 14  CREATININE 1.01  CALCIUM 9.2  TSH 0.90   Liver Function Tests: Recent Labs    11/02/16 1512  AST 25  ALT 24  ALKPHOS 64  BILITOT 0.5  PROT 6.2  ALBUMIN 4.0   No results for input(s): LIPASE, AMYLASE in the last 8760 hours. No results for input(s): AMMONIA in the last 8760 hours. CBC: Recent Labs    11/02/16 1512  WBC 5.5  NEUTROABS 3,355  HGB  13.8  HCT 41.6  MCV 88.7  PLT 208   Lipid Panel: No results for input(s): CHOL, HDL, LDLCALC, TRIG, CHOLHDL, LDLDIRECT in the last 8760 hours. Lab Results  Component Value Date   HGBA1C 5.3 11/02/2016    Assessment/Plan 1. History of exposure to HIV - requests repeat testing due to prior exposure to an HIV positive partner - CBC with Differential/Platelet; Future - HIV antibody; Future - Hepatitis C antibody; Future - C. trachomatis/N. gonorrhoeae RNA; Future - Drug Tox Monitor 1 w/Conf, Oral Fld; Future  2. Obstructive sleep apnea (adult) (pediatric) -cont CPAP which has been very helpful for him  3. Anxiety state -cont lorazepam as needed at bedtime to help him sleep  4. Cervicalgia -neck pain chronic and responds to hydrocodone  and massage  5. High risk homosexual behavior - f/u lab testing  - CBC with Differential/Platelet; Future - Basic metabolic panel; Future - Hepatic function panel; Future - HIV antibody; Future - Hepatitis C antibody; Future - C. trachomatis/N. gonorrhoeae RNA; Future - Drug Tox Monitor 1 w/Conf, Oral Fld; Future  6. Screening for hyperlipidemia - lipids wnl (given normal hdl) in 2017 - Lipid panel; Future  Labs/tests ordered:   Orders Placed This Encounter  Procedures  . C. trachomatis/N. gonorrhoeae RNA    Standing Status:   Future    Standing Expiration Date:   04/12/2018  . CBC with Differential/Platelet    Standing Status:   Future    Standing Expiration Date:   08/10/2017  . Lipid panel    Standing Status:   Future    Standing Expiration Date:   08/10/2017    Order Specific Question:   Has the patient fasted?    Answer:   Yes  . Basic metabolic panel    Standing Status:   Future    Standing Expiration Date:   08/10/2017    Order Specific Question:   Has the patient fasted?    Answer:   Yes  . Hepatic function panel    Standing Status:   Future    Standing Expiration Date:   08/10/2017  . HIV antibody    Standing Status:    Future    Standing Expiration Date:   08/10/2017  . Hepatitis C antibody    Standing Status:   Future    Standing Expiration Date:   08/10/2017  . Drug Tox Monitor 1 w/Conf, Oral Fld    On hydrocodone and lorazepam    Standing Status:   Future    Standing Expiration Date:   04/12/2018   Next appt:  5 mos med mgt  Come tomorrow am fasting for labs above  Olla Delancey L. Raeleigh Guinn, D.O. Geriatrics [facility] [address], [place] [address] Cell Phone (Mon-Fri 8am-5pm):  [phone] On Call:  [phone] & follow prompts after 5pm & weekends Office Phone:  [phone] Office Fax:  [phone]

## 2017-04-13 ENCOUNTER — Other Ambulatory Visit: Payer: 59

## 2017-04-13 DIAGNOSIS — Z13220 Encounter for screening for lipoid disorders: Secondary | ICD-10-CM

## 2017-04-13 DIAGNOSIS — Z7252 High risk homosexual behavior: Secondary | ICD-10-CM

## 2017-04-13 DIAGNOSIS — Z206 Contact with and (suspected) exposure to human immunodeficiency virus [HIV]: Secondary | ICD-10-CM

## 2017-04-14 LAB — C. TRACHOMATIS/N. GONORRHOEAE RNA
C. trachomatis RNA, TMA: NOT DETECTED
N. gonorrhoeae RNA, TMA: NOT DETECTED

## 2017-04-15 ENCOUNTER — Encounter: Payer: Self-pay | Admitting: *Deleted

## 2017-04-17 LAB — CBC WITH DIFFERENTIAL/PLATELET
Basophils Absolute: 41 cells/uL (ref 0–200)
Basophils Relative: 0.8 %
Eosinophils Absolute: 209 cells/uL (ref 15–500)
Eosinophils Relative: 4.1 %
HCT: 42.2 % (ref 38.5–50.0)
Hemoglobin: 14.2 g/dL (ref 13.2–17.1)
Lymphs Abs: 1913 cells/uL (ref 850–3900)
MCH: 29 pg (ref 27.0–33.0)
MCHC: 33.6 g/dL (ref 32.0–36.0)
MCV: 86.1 fL (ref 80.0–100.0)
MPV: 10.8 fL (ref 7.5–12.5)
Monocytes Relative: 8.3 %
Neutro Abs: 2514 cells/uL (ref 1500–7800)
Neutrophils Relative %: 49.3 %
Platelets: 217 10*3/uL (ref 140–400)
RBC: 4.9 10*6/uL (ref 4.20–5.80)
RDW: 12.8 % (ref 11.0–15.0)
Total Lymphocyte: 37.5 %
WBC mixed population: 423 cells/uL (ref 200–950)
WBC: 5.1 10*3/uL (ref 3.8–10.8)

## 2017-04-17 LAB — HEPATITIS C ANTIBODY
Hepatitis C Ab: NONREACTIVE
SIGNAL TO CUT-OFF: 0.39 (ref <1.00)

## 2017-04-17 LAB — DRUG TOX MONITOR 1 W/CONF, ORAL FLD
Alprazolam: NEGATIVE ng/mL (ref <0.50)
Amphetamines: NEGATIVE ng/mL (ref <10)
Barbiturates: NEGATIVE ng/mL (ref <10)
Benzodiazepines: POSITIVE ng/mL — AB (ref <0.50)
Buprenorphine: NEGATIVE ng/mL (ref <0.10)
Chlordiazepoxide: NEGATIVE ng/mL (ref <0.50)
Clonazepam: NEGATIVE ng/mL (ref <0.50)
Cocaine: NEGATIVE ng/mL (ref <5.0)
Codeine: NEGATIVE ng/mL (ref <2.5)
Diazepam: NEGATIVE ng/mL (ref <0.50)
Dihydrocodeine: 9.3 ng/mL — ABNORMAL HIGH (ref <2.5)
Fentanyl: NEGATIVE ng/mL (ref <0.10)
Flunitrazepam: NEGATIVE ng/mL (ref <0.50)
Flurazepam: NEGATIVE ng/mL (ref <0.50)
Heroin Metabolite: NEGATIVE ng/mL (ref <1.0)
Hydrocodone: 62.5 ng/mL — ABNORMAL HIGH (ref <2.5)
Hydromorphone: NEGATIVE ng/mL (ref <2.5)
Lorazepam: 13.19 ng/mL — ABNORMAL HIGH (ref <0.50)
MARIJUANA: NEGATIVE ng/mL (ref <2.5)
MDMA: NEGATIVE ng/mL (ref <10)
Meprobamate: NEGATIVE ng/mL (ref <2.5)
Methadone: NEGATIVE ng/mL (ref <5.0)
Midazolam: NEGATIVE ng/mL (ref <0.50)
Morphine: NEGATIVE ng/mL (ref <2.5)
Nicotine Metabolite: NEGATIVE ng/mL (ref <5.0)
Nordiazepam: NEGATIVE ng/mL (ref <0.50)
Norhydrocodone: 6.1 ng/mL — ABNORMAL HIGH (ref <2.5)
Noroxycodone: NEGATIVE ng/mL (ref <2.5)
Opiates: POSITIVE ng/mL — AB (ref <2.5)
Oxazepam: NEGATIVE ng/mL (ref <0.50)
Oxycodone: NEGATIVE ng/mL (ref <2.5)
Oxymorphone: NEGATIVE ng/mL (ref <2.5)
Phencyclidine: NEGATIVE ng/mL (ref <10)
Tapentadol: NEGATIVE ng/mL (ref <5.0)
Temazepam: NEGATIVE ng/mL (ref <0.50)
Tramadol: NEGATIVE ng/mL (ref <5.0)
Triazolam: NEGATIVE ng/mL (ref <0.50)
Zolpidem: NEGATIVE ng/mL (ref <5.0)

## 2017-04-17 LAB — BASIC METABOLIC PANEL
BUN: 18 mg/dL (ref 7–25)
CO2: 27 mmol/L (ref 20–32)
Calcium: 9.6 mg/dL (ref 8.6–10.3)
Chloride: 104 mmol/L (ref 98–110)
Creat: 0.99 mg/dL (ref 0.70–1.33)
Glucose, Bld: 105 mg/dL — ABNORMAL HIGH (ref 65–99)
Potassium: 4.4 mmol/L (ref 3.5–5.3)
Sodium: 137 mmol/L (ref 135–146)

## 2017-04-17 LAB — HEPATIC FUNCTION PANEL
AG Ratio: 1.7 (calc) (ref 1.0–2.5)
ALT: 29 U/L (ref 9–46)
AST: 25 U/L (ref 10–35)
Albumin: 4.1 g/dL (ref 3.6–5.1)
Alkaline phosphatase (APISO): 58 U/L (ref 40–115)
Bilirubin, Direct: 0.1 mg/dL (ref 0.0–0.2)
Globulin: 2.4 g/dL (calc) (ref 1.9–3.7)
Indirect Bilirubin: 0.5 mg/dL (calc) (ref 0.2–1.2)
Total Bilirubin: 0.6 mg/dL (ref 0.2–1.2)
Total Protein: 6.5 g/dL (ref 6.1–8.1)

## 2017-04-17 LAB — LIPID PANEL
Cholesterol: 198 mg/dL (ref <200)
HDL: 74 mg/dL (ref >40)
LDL Cholesterol (Calc): 106 mg/dL (calc) — ABNORMAL HIGH
Non-HDL Cholesterol (Calc): 124 mg/dL (calc) (ref <130)
Total CHOL/HDL Ratio: 2.7 (calc) (ref <5.0)
Triglycerides: 85 mg/dL (ref <150)

## 2017-04-17 LAB — HIV ANTIBODY (ROUTINE TESTING W REFLEX): HIV 1&2 Ab, 4th Generation: NONREACTIVE

## 2017-04-26 ENCOUNTER — Other Ambulatory Visit: Payer: Self-pay

## 2017-04-26 DIAGNOSIS — F411 Generalized anxiety disorder: Secondary | ICD-10-CM

## 2017-04-26 DIAGNOSIS — M542 Cervicalgia: Secondary | ICD-10-CM

## 2017-04-26 MED ORDER — HYDROCODONE-ACETAMINOPHEN 7.5-325 MG PO TABS: ORAL_TABLET | ORAL | 0 refills | Status: DC

## 2017-04-26 MED ORDER — LORAZEPAM 1 MG PO TABS: 1.0000 mg | ORAL_TABLET | Freq: Two times a day (BID) | ORAL | 0 refills | Status: DC | PRN

## 2017-04-26 NOTE — Telephone Encounter (Signed)
[facility] Database verified and compliance confirmed   

## 2017-04-27 ENCOUNTER — Other Ambulatory Visit: Payer: Self-pay | Admitting: *Deleted

## 2017-04-27 DIAGNOSIS — Z206 Contact with and (suspected) exposure to human immunodeficiency virus [HIV]: Secondary | ICD-10-CM

## 2017-04-27 MED ORDER — EMTRICITABINE-TENOFOVIR DF 200-300 MG PO TABS: ORAL_TABLET | ORAL | 1 refills | Status: DC

## 2017-04-27 NOTE — Telephone Encounter (Signed)
[facility]  [phone] Fax: [phone]

## 2017-04-27 NOTE — Telephone Encounter (Signed)
Left message on voicemail informing patient rx's sent to pharmacy  

## 2017-05-25 ENCOUNTER — Other Ambulatory Visit: Payer: Self-pay | Admitting: *Deleted

## 2017-05-25 DIAGNOSIS — M542 Cervicalgia: Secondary | ICD-10-CM

## 2017-05-25 DIAGNOSIS — F411 Generalized anxiety disorder: Secondary | ICD-10-CM

## 2017-05-25 MED ORDER — HYDROCODONE-ACETAMINOPHEN 7.5-325 MG PO TABS: ORAL_TABLET | ORAL | 0 refills | Status: DC

## 2017-05-25 MED ORDER — LORAZEPAM 1 MG PO TABS: 1.0000 mg | ORAL_TABLET | Freq: Two times a day (BID) | ORAL | 0 refills | Status: DC | PRN

## 2017-05-25 NOTE — Telephone Encounter (Signed)
Patient requested refill NCCSRS Database Verified Pharmacy Confirmed Pended Rxs and sent to Dr. Renato Gailseed for approval.

## 2017-06-24 ENCOUNTER — Other Ambulatory Visit: Payer: Self-pay | Admitting: *Deleted

## 2017-06-24 DIAGNOSIS — M542 Cervicalgia: Secondary | ICD-10-CM

## 2017-06-24 DIAGNOSIS — F411 Generalized anxiety disorder: Secondary | ICD-10-CM

## 2017-06-24 MED ORDER — LORAZEPAM 1 MG PO TABS: 1.0000 mg | ORAL_TABLET | Freq: Two times a day (BID) | ORAL | 0 refills | Status: DC | PRN

## 2017-06-24 MED ORDER — HYDROCODONE-ACETAMINOPHEN 7.5-325 MG PO TABS: ORAL_TABLET | ORAL | 0 refills | Status: DC

## 2017-06-24 NOTE — Telephone Encounter (Signed)
Patient called requesting refill on Narcotic NCCSRS Database Verified LR: 05/26/17 Pharmacy Confirmed Pended Rx and sent to Dr. Renato Gailseed for approval.

## 2017-07-26 ENCOUNTER — Other Ambulatory Visit: Payer: Self-pay | Admitting: *Deleted

## 2017-07-26 DIAGNOSIS — F411 Generalized anxiety disorder: Secondary | ICD-10-CM

## 2017-07-26 DIAGNOSIS — M542 Cervicalgia: Secondary | ICD-10-CM

## 2017-07-26 MED ORDER — HYDROCODONE-ACETAMINOPHEN 7.5-325 MG PO TABS: ORAL_TABLET | ORAL | 0 refills | Status: DC

## 2017-07-26 MED ORDER — LORAZEPAM 1 MG PO TABS: 1.0000 mg | ORAL_TABLET | Freq: Two times a day (BID) | ORAL | 0 refills | Status: DC | PRN

## 2017-07-26 NOTE — Telephone Encounter (Signed)
Patient requested.  NCCSRS Database Verified  LR: 06/24/2017 Pended Rx and sent to Dr. Renato Gails for approval.

## 2017-08-26 ENCOUNTER — Other Ambulatory Visit: Payer: Self-pay | Admitting: *Deleted

## 2017-08-26 DIAGNOSIS — M542 Cervicalgia: Secondary | ICD-10-CM

## 2017-08-26 DIAGNOSIS — F411 Generalized anxiety disorder: Secondary | ICD-10-CM

## 2017-08-26 MED ORDER — HYDROCODONE-ACETAMINOPHEN 7.5-325 MG PO TABS: ORAL_TABLET | ORAL | 0 refills | Status: DC

## 2017-08-26 MED ORDER — LORAZEPAM 1 MG PO TABS: 1.0000 mg | ORAL_TABLET | Freq: Two times a day (BID) | ORAL | 0 refills | Status: DC | PRN

## 2017-08-26 NOTE — Telephone Encounter (Signed)
Patient requested NCCSRS Database Verified LR: 07/26/2017 Pharmacy Confirmed Pended Rx and sent to Dr. Renato Gailseed for approval.

## 2017-09-28 ENCOUNTER — Other Ambulatory Visit: Payer: Self-pay

## 2017-09-28 DIAGNOSIS — M542 Cervicalgia: Secondary | ICD-10-CM

## 2017-09-28 DIAGNOSIS — F411 Generalized anxiety disorder: Secondary | ICD-10-CM

## 2017-09-28 MED ORDER — LORAZEPAM 1 MG PO TABS: 1.0000 mg | ORAL_TABLET | Freq: Two times a day (BID) | ORAL | 0 refills | Status: DC | PRN

## 2017-09-28 MED ORDER — HYDROCODONE-ACETAMINOPHEN 7.5-325 MG PO TABS: ORAL_TABLET | ORAL | 0 refills | Status: DC

## 2017-09-28 NOTE — Telephone Encounter (Signed)
Patient requested a refill on hydrocodone and lorazepam. [place] [facility] [place] [facility] [place]. Rx was pended to provider for approval  after verifying last fill date, provider, and quantity on PMP [place] database

## 2017-10-11 ENCOUNTER — Ambulatory Visit (INDEPENDENT_AMBULATORY_CARE_PROVIDER_SITE_OTHER): Payer: 59 | Admitting: Internal Medicine

## 2017-10-11 ENCOUNTER — Encounter: Payer: Self-pay | Admitting: Internal Medicine

## 2017-10-11 VITALS — BP 132/78 | HR 88 | Ht 72.0 in | Wt 181.0 lb

## 2017-10-11 DIAGNOSIS — R739 Hyperglycemia, unspecified: Secondary | ICD-10-CM

## 2017-10-11 DIAGNOSIS — K4120 Bilateral femoral hernia, without obstruction or gangrene, not specified as recurrent: Secondary | ICD-10-CM | POA: Diagnosis not present

## 2017-10-11 DIAGNOSIS — F411 Generalized anxiety disorder: Secondary | ICD-10-CM

## 2017-10-11 DIAGNOSIS — Z13220 Encounter for screening for lipoid disorders: Secondary | ICD-10-CM | POA: Diagnosis not present

## 2017-10-11 DIAGNOSIS — Z7252 High risk homosexual behavior: Secondary | ICD-10-CM | POA: Diagnosis not present

## 2017-10-11 DIAGNOSIS — M542 Cervicalgia: Secondary | ICD-10-CM | POA: Diagnosis not present

## 2017-10-11 DIAGNOSIS — R3911 Hesitancy of micturition: Secondary | ICD-10-CM | POA: Diagnosis not present

## 2017-10-11 DIAGNOSIS — G4733 Obstructive sleep apnea (adult) (pediatric): Secondary | ICD-10-CM | POA: Diagnosis not present

## 2017-10-11 DIAGNOSIS — N401 Enlarged prostate with lower urinary tract symptoms: Secondary | ICD-10-CM

## 2017-10-11 NOTE — Progress Notes (Signed)
Location:  [facility] Provider:  Kobe Jansma L. Renato Gailseed, D.O., C.[facility]  Code Status: full code Goals of Care:  Advanced Directives 10/11/2017  Does Patient Have a Medical Advance Directive? No  Type of Advance Directive -  Does patient want to make changes to medical advance directive? -  Copy of Healthcare Power of Attorney in Chart? -  Would patient like information on creating a medical advance directive? No - Patient declined   Chief Complaint  Patient presents with  . Medical Management of Chronic Issues    5mth follow-up    HPI: Patient is a 57 y.o. male seen today for medical management of chronic diseases.    Things are going well.    He is now in a relationship now with Weston Brassick, who is a younger partner--they are sexually active.   Remains married to [place], but they are not sexually active.  They are in a good friendship.  He has only forgotten his truvada once and took it the next morning.  Pt is unsure if Weston Brassick is only having sexual activity with him. He would like to receive testing due to these uncertainties.  Neck remains crunchy.  He had to limit what he could do in yoga yesterday.  Does wake him at night sometimes.    Thinks he may have a hernia or two.  Lifts weights, frisbee, rock climbing, snow skiing.   He noticed a bulge on the right and later the left.  Slight tenderness at those locations.    Using CPAP religiously.  No breathing problems.  Has not needed singulair (does that in fall with leaves changing).    At 48, he had a cscope.  No polyps.  Was totally normal.  Discussed cologuard alternative, but agrees to cscope as best test.  Dr. Bosie ClosSchooler.  Uses the lorazepam at night to settle down, but does not really get anxious since his mom passed away and he's no longer working.    Past Medical History:  Diagnosis Date  . Anxiety   . Asthma   . BPH (benign prostatic hyperplasia)   . Cervicalgia    degenerative changes and foraminal stenoses  . Chest pain,  unspecified   . Depression   . Family history of colonic polyps    Father with benign colon polyp  . Headache, tension type, chronic 01/2012  . Hepatitis B antibody positive 1984  . Hiatal hernia 09/19/10  . Hyperglycemia    104 on 10/10/10  . Hyperlipidemia   . Obstructive sleep apnea (adult) (pediatric)    using CPAP  . Other malaise and fatigue   . Supraventricular premature beats    noted on EKG 05/18/12  . Wrist pain, left     Past Surgical History:  Procedure Laterality Date  . COLON SURGERY  09/17/2008   poylps/hemorriods  . WISDOM TOOTH EXTRACTION  03/2009    Allergies  Allergen Reactions  . Bee Venom Anaphylaxis  . Codeine Other (See Comments)  . Pyridoxine Other (See Comments)    Made him crazy  . Skelaxin [Metaxalone] Other (See Comments)  . Augmentin [Amoxicillin-Pot Clavulanate] Nausea And Vomiting  . Sedapap [Butalbital-Acetaminophen] Other (See Comments)    Outpatient Encounter Medications as of 10/11/2017  Medication Sig  . emtricitabine-tenofovir (TRUVADA) 200-300 MG tablet One daily to prevent HIV infection  . EPINEPHrine 0.3 mg/0.3 mL IJ SOAJ injection Inject 0.503ml into the muscle as needed for allergic reaction  . HYDROcodone-acetaminophen (NORCO) 7.5-325 MG tablet Take one tablet every 4 hours  as needed for pain  . LORazepam (ATIVAN) 1 MG tablet Take 1 tablet (1 mg total) by mouth 2 (two) times daily as needed. for anxiety  . montelukast (SINGULAIR) 10 MG tablet TAKE 1 TABLET(10 MG) BY MOUTH AT BEDTIME  . sildenafil (REVATIO) 20 MG tablet Take 2-5 tablets prior to intercourse   No facility-administered encounter medications on file as of 10/11/2017.     Review of Systems:  Review of Systems  Constitutional: Negative for chills, fever and malaise/fatigue.  HENT: Negative for congestion and hearing loss.   Eyes: Negative for blurred vision.  Respiratory: Negative for cough and shortness of breath.   Cardiovascular: Negative for chest pain,  palpitations and leg swelling.  Gastrointestinal: Negative for abdominal pain, blood in stool, constipation and melena.  Genitourinary: Negative for dysuria.  Musculoskeletal: Positive for neck pain. Negative for back pain, falls, joint pain and myalgias.  Skin: Negative for itching and rash.  Neurological: Negative for dizziness and loss of consciousness.  Psychiatric/Behavioral: Negative for depression and memory loss. The patient has insomnia. The patient is not nervous/anxious.        Uses ativan for sleep    Health Maintenance  Topic Date Due  . INFLUENZA VACCINE  10/21/2017  . COLONOSCOPY  09/18/2018  . TETANUS/TDAP  12/11/2025  . Hepatitis C Screening  Completed  . HIV Screening  Completed    Physical Exam: Vitals:   10/11/17 1017  BP: 132/78  Pulse: 88  SpO2: 96%  Weight: 181 lb (82.1 kg)  Height: 6' (1.829 m)   Body mass index is 24.55 kg/m. Physical Exam  Constitutional: He is oriented to person, place, and time. He appears well-developed and well-nourished. No distress.  Cardiovascular: Normal rate, regular rhythm, normal heart sounds and intact distal pulses.  Pulmonary/Chest: Effort normal and breath sounds normal. No respiratory distress.  Abdominal: Bowel sounds are normal. He exhibits no distension. There is no tenderness. A hernia is present.  Bilateral, right more prominent than left  Musculoskeletal: Normal range of motion.  Neck crepitus  Neurological: He is alert and oriented to person, place, and time.  Skin: Skin is warm and dry. Capillary refill takes less than 2 seconds.  Psychiatric: He has a normal mood and affect.    Labs reviewed: Basic Metabolic Panel: Recent Labs    11/02/16 1512 04/13/17 0937  NA 139 137  K 4.5 4.4  CL 105 104  CO2 21 27  GLUCOSE 95 105*  BUN 14 18  CREATININE 1.01 0.99  CALCIUM 9.2 9.6  TSH 0.90  --    Liver Function Tests: Recent Labs    11/02/16 1512 04/13/17 0937  AST 25 25  ALT 24 29  ALKPHOS 64   --   BILITOT 0.5 0.6  PROT 6.2 6.5  ALBUMIN 4.0  --    No results for input(s): LIPASE, AMYLASE in the last 8760 hours. No results for input(s): AMMONIA in the last 8760 hours. CBC: Recent Labs    11/02/16 1512 04/13/17 0937  WBC 5.5 5.1  NEUTROABS 3,355 2,514  HGB 13.8 14.2  HCT 41.6 42.2  MCV 88.7 86.1  PLT 208 217   Lipid Panel: Recent Labs    04/13/17 0937  CHOL 198  HDL 74  LDLCALC 106*  TRIG 85  CHOLHDL 2.7   Lab Results  Component Value Date   HGBA1C 5.3 11/02/2016     Assessment/Plan 1. Bilateral femoral hernia without obstruction or gangrene, recurrence not specified -newly noted with right more  prominent that left -recommended he see a general surgeon in consultation to determine if he needs surgery which would be elective at this time -he's very active and I'm concerned it will get worse, obstructed or gangrenous in the midst of rock climbing, lifting, etc.  2. Cervicalgia -bothersome, only persistent problem that affects his day to day life -continues on hydrocodone when severe, sometimes wakes him at night  3. Obstructive sleep apnea (adult) (pediatric) -continues on CPAP with benefit  4. Benign prostatic hyperplasia with urinary hesitancy -no changes reported, not on meds  5. Hyperglycemia - has been ok in Jan, exercises and eats well, f/u lab before CPE - CBC with Differential/Platelet; Future - COMPLETE METABOLIC PANEL WITH GFR; Future - Hemoglobin A1c; Future  6. Anxiety state -cont lorazepam as needed for insomnia related to inability to calm himself at bedtime  7. High risk homosexual behavior - see hpi - HIV antibody - Hepatitis C antibody - Drug Tox Monitor 1 w/Conf, Oral Fld; Future - Hepatitis C antibody; Future - RPR; Future - HIV antibody; Future  8. Lipid screening - last lipids good considering no concerning risk factors for CAD, monitor annually - Lipid panel; Future  Labs/tests ordered:   Orders Placed This  Encounter  Procedures  . HIV antibody  . Hepatitis C antibody  . CBC with Differential/Platelet    Standing Status:   Future    Standing Expiration Date:   10/12/2018  . COMPLETE METABOLIC PANEL WITH GFR    Standing Status:   Future    Standing Expiration Date:   10/12/2018  . Hemoglobin A1c    Standing Status:   Future    Standing Expiration Date:   10/12/2018  . Lipid panel    Standing Status:   Future    Standing Expiration Date:   10/12/2018  . Drug Tox Monitor 1 w/Conf, Oral Fld    Standing Status:   Future    Standing Expiration Date:   10/12/2018  . Hepatitis C antibody    Standing Status:   Future    Standing Expiration Date:   10/12/2018  . RPR    Standing Status:   Future    Standing Expiration Date:   10/12/2018  . HIV antibody    Standing Status:   Future    Standing Expiration Date:   10/12/2018    Next appt:  6 mos for CPE, labs before  Tariya Morrissette L. Rhiley Tarver, D.O. Geriatrics [facility] [address], [place] [address] Cell Phone (Mon-Fri 8am-5pm):  [phone] On Call:  [phone] & follow prompts after 5pm & weekends Office Phone:  [phone] Office Fax:  [phone]

## 2017-10-12 ENCOUNTER — Telehealth: Payer: Self-pay | Admitting: Internal Medicine

## 2017-10-12 DIAGNOSIS — K4120 Bilateral femoral hernia, without obstruction or gangrene, not specified as recurrent: Secondary | ICD-10-CM

## 2017-10-12 LAB — HEPATITIS C ANTIBODY
Hepatitis C Ab: NONREACTIVE
SIGNAL TO CUT-OFF: 0.33 (ref <1.00)

## 2017-10-12 LAB — HIV ANTIBODY (ROUTINE TESTING W REFLEX): HIV 1&2 Ab, 4th Generation: NONREACTIVE

## 2017-10-12 NOTE — Telephone Encounter (Signed)
Referral entered  

## 2017-10-12 NOTE — Telephone Encounter (Signed)
Patient called he would like a referral to a Surgeon for [facility]'s discuss at his last visit.     To : Dr. Camillo FlamingFrank  [facility] [place] Telephone # [phone]

## 2017-10-14 ENCOUNTER — Telehealth: Payer: Self-pay | Admitting: *Deleted

## 2017-10-14 NOTE — Telephone Encounter (Signed)
Received fax from [facility] #[phone] Fax: [phone], Specialty Prescription Form for Truvada 200/300mg  tablets-One daily to prevent infection.   Placed in Dr. Ernest Mallickeed's folder to review, fill out and sign.

## 2017-10-15 ENCOUNTER — Encounter: Payer: Self-pay | Admitting: *Deleted

## 2017-10-28 ENCOUNTER — Other Ambulatory Visit: Payer: Self-pay | Admitting: *Deleted

## 2017-10-28 DIAGNOSIS — M542 Cervicalgia: Secondary | ICD-10-CM

## 2017-10-28 DIAGNOSIS — F411 Generalized anxiety disorder: Secondary | ICD-10-CM

## 2017-10-28 MED ORDER — LORAZEPAM 1 MG PO TABS: 1.0000 mg | ORAL_TABLET | Freq: Two times a day (BID) | ORAL | 0 refills | Status: DC | PRN

## 2017-10-28 MED ORDER — HYDROCODONE-ACETAMINOPHEN 7.5-325 MG PO TABS: ORAL_TABLET | ORAL | 0 refills | Status: DC

## 2017-10-28 NOTE — Telephone Encounter (Signed)
Patient requested refill NCCSRS Database Verified LR: 09/28/2017 Pharmacy confirmed Pended Rx and sent to Dr. Renato Gailseed for approval.

## 2017-11-29 ENCOUNTER — Other Ambulatory Visit: Payer: Self-pay | Admitting: *Deleted

## 2017-11-29 DIAGNOSIS — M542 Cervicalgia: Secondary | ICD-10-CM

## 2017-11-29 DIAGNOSIS — F411 Generalized anxiety disorder: Secondary | ICD-10-CM

## 2017-11-29 MED ORDER — LORAZEPAM 1 MG PO TABS: 1.0000 mg | ORAL_TABLET | Freq: Two times a day (BID) | ORAL | 0 refills | Status: DC | PRN

## 2017-11-29 MED ORDER — HYDROCODONE-ACETAMINOPHEN 7.5-325 MG PO TABS: ORAL_TABLET | ORAL | 0 refills | Status: DC

## 2017-11-29 NOTE — Telephone Encounter (Signed)
Patient requested refill NCCSRS Database Verified LR: 10/28/2017 Pharmacy Confirmed Pended Rx and sent to Dr. Renato Gails for approval.

## 2017-12-20 ENCOUNTER — Encounter: Payer: Self-pay | Admitting: Neurology

## 2017-12-21 ENCOUNTER — Encounter: Payer: Self-pay | Admitting: Neurology

## 2017-12-21 ENCOUNTER — Ambulatory Visit (INDEPENDENT_AMBULATORY_CARE_PROVIDER_SITE_OTHER): Payer: 59 | Admitting: Neurology

## 2017-12-21 VITALS — BP 124/72 | HR 91 | Ht 72.0 in | Wt 186.5 lb

## 2017-12-21 DIAGNOSIS — G4733 Obstructive sleep apnea (adult) (pediatric): Secondary | ICD-10-CM | POA: Diagnosis not present

## 2017-12-21 DIAGNOSIS — Z9989 Dependence on other enabling machines and devices: Secondary | ICD-10-CM

## 2017-12-21 NOTE — Progress Notes (Signed)
SLEEP MEDICINE CLINIC   Provider:  Melvyn Novas, M D  Referring Provider: [name], DO Primary Care Physician:  [name], DO  Chief Complaint  Patient presents with  . Follow-up    Room 11. Patient is here alone.     HPI: Follow up for this 57 year old caucasian male patient on CPAP.  12-21-2017, I have the pleasure of meeting [name] today for his yearly CPAP compliance visit.  He has used a CPAP 80% for the last month but has been a gap of 3 days which is highly unusual and his average user time on days used is 7 hours and 45 minutes, he has a very low residual AHI of 1.4/h of sleep, set at 8 cmH2O was to submit a EPR, there are some high air leaks.  I have always felt that these were related to facial hair but he is using a nasal pillow.  12-16-2016 He reports from his travel adventure in [place]. [name] just returned from a trip to [place], [name], with an average user time currently on days used of 8 hours and 40 minutes. He used to machine 18 out of 30 days again this was during his travel time- he used his old CPAP as his travel CPAP ( OSA treated since 2002). CPAP is set at 8:30 meter water with 3 cm EPR residual AHI is only 1.8 there are some higher air leaks probably attributable to his facial hair. That needs to be no change in settings. Epworth sleepiness score is 2 points fatigue severity 12 points also no concern from the side. I would like for him to continue using CPAP nightly.     Referral from Dr. Renato Gails for a sleep consultation. James [name] is a right-handed Caucasian gentleman who underwent a sleep study on 08-23-09 at [facility],  which documented an AHI of 22.3. His primary care physician had referred him after her the patient was to hold during a camping trip that fellow travelers had noticed him to snore loudly and have frequent apneas. He also has  severe restless legs. He returned for a titration study on 11-22-2009 and a respironics nasal pillow in  medium size was used to. The patient was titrated to 8 cm water, at which pressure his AHI current of 0.0. He was able to sleep under 10 minutes under the pressure. Some snoring was still noted but to a much lower volume.  Bisacodyl has been compliant he using her CPAP ever since and for the first time today I see her download.  He had been lost in followup also he tried to make an appointment with our sleep clinic.  The CPAP download dated 11-05-13 encompassed  90 days,  an average daily user time of 7 hours and 39 minutes , 100% compliance of 4 hours. The machine is set at 8 cm of water without EPR and his residual AHI is 1.6. In spite of being on CPAP, he cannot sleep in the same bed with his partner, reporting RLS.  It seems, he has not RLS, but PLM s, bothering his partner.  The patient works at night, goes to bed at 1.30 AM and once he puts his CPAP on, he is promptly asleep. He never needs an alarm clock to rise. Sleeps from 1.45 to 9.30 and mainly uninterrputed, only one nocturia.  Work starts at [employer]  PM.  He drinks sweet iced tea, mainly water.   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 He is 100% compliant with the use of CPAP over 4 hours nightly on a 30 day download obtained in office today. Average user time is 7 hours 59 minutes set pressure is 8 cm water without EPR and AHI is 1.9. He does have some air leaks but this is related to his facial hair. Always some Epworth sleepiness score is endorsed at 0. Fatigue severity is endorsed at 0. The patient's partner sleeps in a different room actually on a different floor of the house. Was begun but the patient was still snoring and kicking a lot at night, so he is not able to bear witness to how the sleep habits have changed. A she uses a 59 year old ResMed machine and he  would like to have a more trouble friendly option available lighter and perhaps quieter. The patient has noticed that the machine is noisier than it used to be. He would also like to obtain a car [employer] to be able to camp and uses CPAP machine. In addition he travels long distances and would like to have a CPAP machine that would be usable on a  Plane. 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  Interval history 12/23/2015, The pleasure of seeing Mr.  James [name], today for a CPAP compliance visit his residual AHI is 1.78 cm water pressure without EPR and he has used the machine 100% of the last 30 days and each of those nights over 4 hours. His average user time is 8 hours and 7 minutes. No adjustments have to be made. He does have some higher air leaks but he is not bothered by it. He does like to use a nasal pillow. His Epworth sleepiness score is endorsed at 0,  The patient has a history of childhood asthma and was a smoker for many  ( 20 ?) years. Quit in 2001.   Review of Systems: Out of a complete 14 system review, the patient complains of only the following symptoms, and all other reviewed systems are negative.  tinnitus in the left, some fatigue related to shift work.   Epworth score zero  , Fatigue severity score zero , depression score 1.   Social History   Socioeconomic History  . Marital status: Married    Spouse name: James [name]  . Number of children: 0  . Years of education: 52  . Highest education level: Not on file  Occupational History  . Occupation: Marine scientist: [employer]  Social Needs  . Financial resource strain: Not on file  . Food insecurity:    Worry: Not on file    Inability: Not on file  . Transportation needs:    Medical: Not on file    Non-medical: Not on file  Tobacco Use  . Smoking status: Former Games developer  . Smokeless tobacco: Never Used  Substance and Sexual  Activity  . Alcohol use: Yes    Comment: rarely  . Drug use: No  . Sexual activity: Not on file  Lifestyle  . Physical activity:    Days per week: Not on file    Minutes per session: Not on file  . Stress: Not on file  Relationships  . Social connections:  Talks on phone: Not on file    Gets together: Not on file    Attends religious service: Not on file    Active member of club or organization: Not on file    Attends meetings of clubs or organizations: Not on file    Relationship status: Not on file  . Intimate partner violence:    Fear of current or ex partner: Not on file    Emotionally abused: Not on file    Physically abused: Not on file    Forced sexual activity: Not on file  Other Topics Concern  . Not on file  Social History Narrative   Patient is married ([name]) and lives at home with his husband.   Patient has a college education.   Patient works full-time ([employer]).   Patient is right-handed.   Patient drinks one cup of coffee daily.    Family History  Problem Relation Age of Onset  . Heart disease Mother   . Hypertension Mother   . Diabetes Mother   . Depression Mother   . Cancer Father        2004 bladder cancer  . Alcohol abuse Father     Past Medical History:  Diagnosis Date  . Anxiety   . Asthma   . BPH (benign prostatic hyperplasia)   . Cervicalgia    degenerative changes and foraminal stenoses  . Chest pain, unspecified   . Depression   . Family history of colonic polyps    Father with benign colon polyp  . Headache, tension type, chronic 01/2012  . Hepatitis B antibody positive 1984  . Hiatal hernia 09/19/10  . Hyperglycemia    104 on 10/10/10  . Hyperlipidemia   . Obstructive sleep apnea (adult) (pediatric)    using CPAP  . Other malaise and fatigue   . Supraventricular premature beats    noted on EKG 05/18/12  . Wrist pain, left     Past Surgical History:  Procedure Laterality Date  . COLON SURGERY   09/17/2008   poylps/hemorriods  . WISDOM TOOTH EXTRACTION  03/2009    Current Outpatient Medications  Medication Sig Dispense Refill  . emtricitabine-tenofovir (TRUVADA) 200-300 MG tablet One daily to prevent HIV infection 90 tablet 1  . EPINEPHrine 0.3 mg/0.3 mL IJ SOAJ injection Inject 0.89ml into the muscle as needed for allergic reaction 2 Device 3  . HYDROcodone-acetaminophen (NORCO) 7.5-325 MG tablet Take one tablet every 4 hours as needed for pain 180 tablet 0  . LORazepam (ATIVAN) 1 MG tablet Take 1 tablet (1 mg total) by mouth 2 (two) times daily as needed. for anxiety 60 tablet 0  . montelukast (SINGULAIR) 10 MG tablet TAKE 1 TABLET(10 MG) BY MOUTH AT BEDTIME 30 tablet 2  . sildenafil (REVATIO) 20 MG tablet Take 2-5 tablets prior to intercourse 50 tablet 4   No current facility-administered medications for this visit.     Allergies as of 12/21/2017 - Review Complete 12/21/2017  Allergen Reaction Noted  . Bee venom Anaphylaxis 02/06/2015  . Codeine Other (See Comments) 06/20/2014  . Pyridoxine Other (See Comments) 06/20/2014  . Skelaxin [metaxalone] Other (See Comments) 06/06/2013  . Augmentin [amoxicillin-pot clavulanate] Nausea And Vomiting 06/06/2013  . Sedapap [butalbital-acetaminophen] Other (See Comments) 06/06/2013    Vitals: BP 124/72   Pulse 91   Ht 6' (1.829 m)   Wt 186 lb 8 oz (84.6 kg)   BMI 25.29 kg/m  Last Weight:  Wt Readings from Last 1  Encounters:  12/21/17 186 lb 8 oz (84.6 kg)       Last Height:   Ht Readings from Last 1 Encounters:  12/21/17 6' (1.829 m)    Physical exam:  General: The patient is awake, alert and appears not in acute distress.  No interval medical problem.  The patient is well groomed. Head: Normocephalic, atraumatic. Neck is supple. Mallampati 2   neck circumference: 15 inches . Nasal airflow restricted, mustache, TMJ is evident. Retrognathia is not seen.  Cardiovascular: Regular rate and rhythm , without murmurs or carotid  bruit, and without distended neck veins. Respiratory: Lungs are clear to auscultation. Skin:  Without evidence of edema or rash. Trunk: BMI is elevated and patient has normal posture.  Neurologic exam : The patient is awake and alert, oriented to place and time.   Memory subjective described as intact.   Cranial nerves: Pupils are equal and briskly reactive to light.  Extraocular movements  in vertical and horizontal planes intact and without nystagmus. Visual fields by finger perimetry are intact. Hearing to finger rub intact. Facial sensation intact to fine touch.  Facial motor strength is symmetric wiht tongue and uvula moving in midline. Motor exam: Normal tone  muscle bulk and symmetric, strength in all extremities. Sensory: Fine touch, pinprick and vibration were tested in all extremities.   Assessment:  After physical and neurologic examination, review of laboratory studies, imaging, neurophysiology testing and pre-existing records, assessment is   1) OSA - very well controlled on CPAP - 100% compliance - 80% on home machine.       He uses 2 different CPAPs.- one for travel and one for home use.   The patient was advised of the nature of the diagnosed sleep disorder , the treatment options and risks for general a health and wellness arising from not treating the condition. Visit duration was 20 minutes.   Plan:  RV once a year . CPAP compliance remains high.   PS : he asked about a PTSD counselor-  I would like him to see someone- but I have to think about who this should be.  [facility] - [phone]   Aaryn Sermon MD  12/21/2017

## 2017-12-28 ENCOUNTER — Other Ambulatory Visit: Payer: Self-pay | Admitting: *Deleted

## 2017-12-28 DIAGNOSIS — M542 Cervicalgia: Secondary | ICD-10-CM

## 2017-12-28 DIAGNOSIS — F411 Generalized anxiety disorder: Secondary | ICD-10-CM

## 2017-12-28 MED ORDER — LORAZEPAM 1 MG PO TABS: 1.0000 mg | ORAL_TABLET | Freq: Two times a day (BID) | ORAL | 0 refills | Status: DC | PRN

## 2017-12-28 MED ORDER — HYDROCODONE-ACETAMINOPHEN 7.5-325 MG PO TABS: ORAL_TABLET | ORAL | 0 refills | Status: DC

## 2017-12-28 NOTE — Telephone Encounter (Signed)
Patient requested refill NCCSRS Database Verified LR: 11/29/2017 Pended Rx and sent to Dr. Renato Gails for approval.

## 2018-01-27 ENCOUNTER — Other Ambulatory Visit: Payer: Self-pay | Admitting: *Deleted

## 2018-01-27 DIAGNOSIS — M542 Cervicalgia: Secondary | ICD-10-CM

## 2018-01-27 DIAGNOSIS — F411 Generalized anxiety disorder: Secondary | ICD-10-CM

## 2018-01-27 MED ORDER — HYDROCODONE-ACETAMINOPHEN 7.5-325 MG PO TABS: ORAL_TABLET | ORAL | 0 refills | Status: DC

## 2018-01-27 MED ORDER — LORAZEPAM 1 MG PO TABS: 1.0000 mg | ORAL_TABLET | Freq: Two times a day (BID) | ORAL | 0 refills | Status: DC | PRN

## 2018-01-27 NOTE — Telephone Encounter (Signed)
Patient requested refill NCCSRS Database Verified LR: 12/28/2017 Pended Rx and sent to Dr. Reed for approval.  

## 2018-02-16 ENCOUNTER — Encounter: Payer: Self-pay | Admitting: Internal Medicine

## 2018-02-25 ENCOUNTER — Other Ambulatory Visit: Payer: Self-pay | Admitting: *Deleted

## 2018-02-25 DIAGNOSIS — M542 Cervicalgia: Secondary | ICD-10-CM

## 2018-02-25 DIAGNOSIS — F411 Generalized anxiety disorder: Secondary | ICD-10-CM

## 2018-02-25 MED ORDER — HYDROCODONE-ACETAMINOPHEN 7.5-325 MG PO TABS: ORAL_TABLET | ORAL | 0 refills | Status: DC

## 2018-02-25 MED ORDER — LORAZEPAM 1 MG PO TABS: 1.0000 mg | ORAL_TABLET | Freq: Two times a day (BID) | ORAL | 0 refills | Status: DC | PRN

## 2018-02-25 NOTE — Telephone Encounter (Signed)
Patient requested refill NCCSRS Database Verified LR: 01/27/2018 Pended Rx and sent to Dr. Reed for approval.  

## 2018-03-28 ENCOUNTER — Other Ambulatory Visit: Payer: Self-pay | Admitting: *Deleted

## 2018-03-28 DIAGNOSIS — M542 Cervicalgia: Secondary | ICD-10-CM

## 2018-03-28 DIAGNOSIS — F411 Generalized anxiety disorder: Secondary | ICD-10-CM

## 2018-03-28 MED ORDER — HYDROCODONE-ACETAMINOPHEN 7.5-325 MG PO TABS: ORAL_TABLET | ORAL | 0 refills | Status: DC

## 2018-03-28 MED ORDER — LORAZEPAM 1 MG PO TABS: 1.0000 mg | ORAL_TABLET | Freq: Two times a day (BID) | ORAL | 0 refills | Status: DC | PRN

## 2018-03-28 NOTE — Telephone Encounter (Signed)
Patient requested refill  NCCSRS Database Verified LR: 02/25/2018 Pended Rx and sent to Dr. Renato Gails for approval.

## 2018-04-27 ENCOUNTER — Other Ambulatory Visit: Payer: Self-pay | Admitting: *Deleted

## 2018-04-27 DIAGNOSIS — F411 Generalized anxiety disorder: Secondary | ICD-10-CM

## 2018-04-27 DIAGNOSIS — M542 Cervicalgia: Secondary | ICD-10-CM

## 2018-04-27 MED ORDER — HYDROCODONE-ACETAMINOPHEN 7.5-325 MG PO TABS: ORAL_TABLET | ORAL | 0 refills | Status: DC

## 2018-04-27 MED ORDER — LORAZEPAM 1 MG PO TABS: 1.0000 mg | ORAL_TABLET | Freq: Two times a day (BID) | ORAL | 0 refills | Status: DC | PRN

## 2018-04-27 NOTE — Telephone Encounter (Signed)
Patient requested refill NCCSRS Database Verified LR: 03/29/18 Pended Rx and sent to Dr. Renato Gails for approval.

## 2018-04-28 ENCOUNTER — Other Ambulatory Visit: Payer: 59

## 2018-04-28 DIAGNOSIS — Z13220 Encounter for screening for lipoid disorders: Secondary | ICD-10-CM

## 2018-04-28 DIAGNOSIS — R739 Hyperglycemia, unspecified: Secondary | ICD-10-CM

## 2018-04-28 DIAGNOSIS — Z7252 High risk homosexual behavior: Secondary | ICD-10-CM

## 2018-05-02 ENCOUNTER — Encounter: Payer: 59 | Admitting: Internal Medicine

## 2018-05-03 ENCOUNTER — Telehealth: Payer: Self-pay

## 2018-05-03 NOTE — Telephone Encounter (Signed)
Patient called and stated he was returning a call to [facility]. Looked at previous notes left in patient's chart under labs  Notes recorded by Sueanne Margarita, CMA on 05/02/2018 at 4:29 PM EST .left message to have patient return my call.  ------ Notes recorded by [name], DO on 05/02/2018 at 12:29 PM EST HIV, hepatitis C, syphilis tests all negative.  Cholesterol is trending up--now 118 with goal less than 100. Recommend dietary changes to address this unless he's willing to take medication. Remainder of labs are normal. Drug screen pending  Discussed lab results with patient. He verbalized understanding and denied further questions.             and discussed lab results with patient. Patient verbalized understanding and denied further questions.

## 2018-05-04 LAB — LIPID PANEL
Cholesterol: 186 mg/dL (ref <200)
HDL: 49 mg/dL (ref >=40)
LDL Cholesterol (Calc): 118 mg/dL (calc) — ABNORMAL HIGH
Non-HDL Cholesterol (Calc): 137 mg/dL (calc) — ABNORMAL HIGH (ref <130)
Total CHOL/HDL Ratio: 3.8 (calc) (ref <5.0)
Triglycerides: 88 mg/dL (ref <150)

## 2018-05-04 LAB — COMPLETE METABOLIC PANEL WITH GFR
AG Ratio: 1.7 (calc) (ref 1.0–2.5)
ALT: 23 U/L (ref 9–46)
AST: 24 U/L (ref 10–35)
Albumin: 3.8 g/dL (ref 3.6–5.1)
Alkaline phosphatase (APISO): 54 U/L (ref 35–144)
BUN: 16 mg/dL (ref 7–25)
CO2: 27 mmol/L (ref 20–32)
Calcium: 9 mg/dL (ref 8.6–10.3)
Chloride: 108 mmol/L (ref 98–110)
Creat: 0.91 mg/dL (ref 0.70–1.33)
GFR, Est African American: 107 mL/min/{1.73_m2} (ref >=60)
GFR, Est Non African American: 92 mL/min/{1.73_m2} (ref >=60)
Globulin: 2.3 g/dL (calc) (ref 1.9–3.7)
Glucose, Bld: 96 mg/dL (ref 65–99)
Potassium: 4.6 mmol/L (ref 3.5–5.3)
Sodium: 140 mmol/L (ref 135–146)
Total Bilirubin: 0.3 mg/dL (ref 0.2–1.2)
Total Protein: 6.1 g/dL (ref 6.1–8.1)

## 2018-05-04 LAB — DRUG TOX MONITOR 1 W/CONF, ORAL FLD
Alprazolam: NEGATIVE ng/mL (ref <0.50)
Amphetamines: NEGATIVE ng/mL (ref <10)
Barbiturates: NEGATIVE ng/mL (ref <10)
Benzodiazepines: POSITIVE ng/mL — AB (ref <0.50)
Buprenorphine: NEGATIVE ng/mL (ref <0.10)
Chlordiazepoxide: NEGATIVE ng/mL (ref <0.50)
Clonazepam: NEGATIVE ng/mL (ref <0.50)
Cocaine: NEGATIVE ng/mL (ref <5.0)
Codeine: NEGATIVE ng/mL (ref <2.5)
Diazepam: NEGATIVE ng/mL (ref <0.50)
Dihydrocodeine: 14.3 ng/mL — ABNORMAL HIGH (ref <2.5)
Fentanyl: NEGATIVE ng/mL (ref <0.10)
Flunitrazepam: NEGATIVE ng/mL (ref <0.50)
Flurazepam: NEGATIVE ng/mL (ref <0.50)
Heroin Metabolite: NEGATIVE ng/mL (ref <1.0)
Hydrocodone: 95.8 ng/mL — ABNORMAL HIGH (ref <2.5)
Hydromorphone: NEGATIVE ng/mL (ref <2.5)
Lorazepam: >25 ng/mL — ABNORMAL HIGH (ref <0.50)
MARIJUANA: NEGATIVE ng/mL (ref <2.5)
MDMA: NEGATIVE ng/mL (ref <10)
Meprobamate: NEGATIVE ng/mL (ref <2.5)
Methadone: NEGATIVE ng/mL (ref <5.0)
Midazolam: NEGATIVE ng/mL (ref <0.50)
Morphine: NEGATIVE ng/mL (ref <2.5)
Nicotine Metabolite: NEGATIVE ng/mL (ref <5.0)
Nordiazepam: NEGATIVE ng/mL (ref <0.50)
Norhydrocodone: 10.1 ng/mL — ABNORMAL HIGH (ref <2.5)
Noroxycodone: NEGATIVE ng/mL (ref <2.5)
Opiates: POSITIVE ng/mL — AB (ref <2.5)
Oxazepam: NEGATIVE ng/mL (ref <0.50)
Oxycodone: NEGATIVE ng/mL (ref <2.5)
Oxymorphone: NEGATIVE ng/mL (ref <2.5)
Phencyclidine: NEGATIVE ng/mL (ref <10)
Tapentadol: NEGATIVE ng/mL (ref <5.0)
Temazepam: NEGATIVE ng/mL (ref <0.50)
Tramadol: NEGATIVE ng/mL (ref <5.0)
Triazolam: NEGATIVE ng/mL (ref <0.50)
Zolpidem: NEGATIVE ng/mL (ref <5.0)

## 2018-05-04 LAB — CBC WITH DIFFERENTIAL/PLATELET
Absolute Monocytes: 351 cells/uL (ref 200–950)
Basophils Absolute: 32 cells/uL (ref 0–200)
Basophils Relative: 0.7 %
Eosinophils Absolute: 351 cells/uL (ref 15–500)
Eosinophils Relative: 7.8 %
HCT: 40.5 % (ref 38.5–50.0)
Hemoglobin: 13.8 g/dL (ref 13.2–17.1)
Lymphs Abs: 1787 cells/uL (ref 850–3900)
MCH: 29.4 pg (ref 27.0–33.0)
MCHC: 34.1 g/dL (ref 32.0–36.0)
MCV: 86.4 fL (ref 80.0–100.0)
MPV: 10.7 fL (ref 7.5–12.5)
Monocytes Relative: 7.8 %
Neutro Abs: 1980 cells/uL (ref 1500–7800)
Neutrophils Relative %: 44 %
Platelets: 219 10*3/uL (ref 140–400)
RBC: 4.69 10*6/uL (ref 4.20–5.80)
RDW: 13.3 % (ref 11.0–15.0)
Total Lymphocyte: 39.7 %
WBC: 4.5 10*3/uL (ref 3.8–10.8)

## 2018-05-04 LAB — HEMOGLOBIN A1C
Hgb A1c MFr Bld: 5.5 % of total Hgb (ref <5.7)
Mean Plasma Glucose: 111 (calc)
eAG (mmol/L): 6.2 (calc)

## 2018-05-04 LAB — HEPATITIS C ANTIBODY
Hepatitis C Ab: NONREACTIVE
SIGNAL TO CUT-OFF: 0.36 (ref <1.00)

## 2018-05-04 LAB — HIV ANTIBODY (ROUTINE TESTING W REFLEX): HIV 1&2 Ab, 4th Generation: NONREACTIVE

## 2018-05-04 LAB — RPR: RPR Ser Ql: NONREACTIVE

## 2018-05-16 ENCOUNTER — Encounter: Payer: Self-pay | Admitting: Internal Medicine

## 2018-05-16 ENCOUNTER — Ambulatory Visit (INDEPENDENT_AMBULATORY_CARE_PROVIDER_SITE_OTHER): Payer: 59 | Admitting: Internal Medicine

## 2018-05-16 VITALS — BP 120/70 | HR 90 | Temp 97.6°F | Ht 72.0 in | Wt 194.0 lb

## 2018-05-16 DIAGNOSIS — G4733 Obstructive sleep apnea (adult) (pediatric): Secondary | ICD-10-CM

## 2018-05-16 DIAGNOSIS — E785 Hyperlipidemia, unspecified: Secondary | ICD-10-CM | POA: Diagnosis not present

## 2018-05-16 DIAGNOSIS — M542 Cervicalgia: Secondary | ICD-10-CM

## 2018-05-16 DIAGNOSIS — Z0000 Encounter for general adult medical examination without abnormal findings: Secondary | ICD-10-CM

## 2018-05-16 DIAGNOSIS — R739 Hyperglycemia, unspecified: Secondary | ICD-10-CM

## 2018-05-16 DIAGNOSIS — Z7252 High risk homosexual behavior: Secondary | ICD-10-CM

## 2018-05-16 NOTE — Progress Notes (Signed)
Provider:  Gwenith Spitziffany L. Renato Gailseed, D.O., C.M.D. Location:   [facility]   Place of Service:   clinic  Previous PCP: Kermit Baloeed, Scarlet Abad L, DO Patient Care Team: Kermit Baloeed, Grayson White L, DO as PCP - General (Geriatric Medicine)  Extended Emergency Contact Information Primary Emergency Contact: [facility][name] Home Phone: [phone] Relation: Other  Goals of Care: Advanced Directive information Advanced Directives 05/16/2018  Does Patient Have a Medical Advance Directive? Yes;No  Type of Advance Directive -  Does patient want to make changes to medical advance directive? No - Patient declined  Copy of Healthcare Power of Attorney in Chart? -  Would patient like information on creating a medical advance directive? -      Chief Complaint  Patient presents with  . Annual Exam    CPE    HPI: Patient is a [age] y.o. male seen today for an annual physical exam.  Still exercising with yoga.  Also doing meditation.   He is swimming more also.  Has joined [employer].  He's having some issues related to narcissism in his parents and some PTSD.  He wants to find a counselor that takes his insurance who helps to manage.  He's going to these meetings twice a week.  He's with his partner for 8 years.  He did not know he'd live that long to be 58 yo.  He is not awakening from his neck pain as often at night.    No new physical concerns.    He does eat red meat often.  Steaks once a week.  May get beef liver.  LDL is 118 and it should be less than 100.  Also does like cheese.  Does also like ice cream.    He's not having symptoms related to his urinary tract.  Dr. Chilton SiGreen had thought his prostate was enlarged before.  He had been on tamsulosin short term--didn't agree with him.    Doing fine with his CPAP.  Gets his report which has been excellent 98-100% sats.    Had terrible headache, lethargy and soreness in his arm after the shingrix.  He had a little sinus congestion too.    Past Medical History:  Diagnosis Date  .  Anxiety   . Asthma   . BPH (benign prostatic hyperplasia)   . Cervicalgia    degenerative changes and foraminal stenoses  . Chest pain, unspecified   . Depression   . Family history of colonic polyps    Father with benign colon polyp  . Headache, tension type, chronic 01/2012  . Hepatitis B antibody positive 1984  . Hiatal hernia 09/19/10  . Hyperglycemia    104 on 10/10/10  . Hyperlipidemia   . Obstructive sleep apnea (adult) (pediatric)    using CPAP  . Other malaise and fatigue   . Supraventricular premature beats    noted on EKG 05/18/12  . Wrist pain, left    Past Surgical History:  Procedure Laterality Date  . COLON SURGERY  09/17/2008   poylps/hemorriods  . WISDOM TOOTH EXTRACTION  03/2009    reports that he has quit smoking. He has never used smokeless tobacco. He reports current alcohol use. He reports that he does not use drugs.  Functional Status Survey:    Family History  Problem Relation Age of Onset  . Heart disease Mother   . Hypertension Mother   . Diabetes Mother   . Depression Mother   . Cancer Father        2004 bladder cancer  .  Alcohol abuse Father     Health Maintenance  Topic Date Due  . INFLUENZA VACCINE  10/21/2017  . COLONOSCOPY  09/18/2018  . TETANUS/TDAP  12/11/2025  . Hepatitis C Screening  Completed  . HIV Screening  Completed    Allergies  Allergen Reactions  . Bee Venom Anaphylaxis  . Codeine Other (See Comments)  . Pyridoxine Other (See Comments)    Made him crazy  . Skelaxin [Metaxalone] Other (See Comments)  . Augmentin [Amoxicillin-Pot Clavulanate] Nausea And Vomiting  . Sedapap [Butalbital-Acetaminophen] Other (See Comments)    Outpatient Encounter Medications as of 05/16/2018  Medication Sig  . emtricitabine-tenofovir (TRUVADA) 200-300 MG tablet One daily to prevent HIV infection  . EPINEPHrine 0.3 mg/0.3 mL IJ SOAJ injection Inject 0.52ml into the muscle as needed for allergic reaction  . HYDROcodone-acetaminophen  (NORCO) 7.5-325 MG tablet Take one tablet every 4 hours as needed for pain  . LORazepam (ATIVAN) 1 MG tablet Take 1 tablet (1 mg total) by mouth 2 (two) times daily as needed. for anxiety  . montelukast (SINGULAIR) 10 MG tablet TAKE 1 TABLET(10 MG) BY MOUTH AT BEDTIME  . sildenafil (REVATIO) 20 MG tablet Take 2-5 tablets prior to intercourse   No facility-administered encounter medications on file as of 05/16/2018.     Review of Systems  Constitutional: Negative for chills, diaphoresis, fever, malaise/fatigue and weight loss.  HENT: Negative for hearing loss.   Eyes: Negative for blurred vision.  Respiratory: Negative for cough and shortness of breath.   Cardiovascular: Negative for chest pain, palpitations, orthopnea, claudication, leg swelling and PND.  Gastrointestinal: Negative for abdominal pain, blood in stool, constipation, diarrhea and melena.  Genitourinary: Negative for dysuria.  Musculoskeletal: Positive for neck pain. Negative for falls and joint pain.  Skin: Negative for itching and rash.  Neurological: Negative for dizziness and loss of consciousness.  Endo/Heme/Allergies: Does not bruise/bleed easily.  Psychiatric/Behavioral: Negative for depression and memory loss. The patient is nervous/anxious. The patient does not have insomnia.     Vitals:   05/16/18 1338  BP: 120/70  Pulse: 90  Temp: 97.6 F (36.4 C)  TempSrc: Oral  SpO2: 96%  Weight: 194 lb (88 kg)  Height: 6' (1.829 m)   Body mass index is 26.31 kg/m. Physical Exam Vitals signs reviewed.  Constitutional:      Appearance: Normal appearance.  HENT:     Head: Normocephalic and atraumatic.     Right Ear: Tympanic membrane, ear canal and external ear normal.     Left Ear: Tympanic membrane, ear canal and external ear normal.     Nose: Nose normal. No congestion or rhinorrhea.     Mouth/Throat:     Mouth: Mucous membranes are moist.     Pharynx: Oropharynx is clear.  Eyes:     Extraocular Movements:  Extraocular movements intact.     Conjunctiva/sclera: Conjunctivae normal.     Pupils: Pupils are equal, round, and reactive to light.  Neck:     Musculoskeletal: Muscular tenderness present. No neck rigidity.     Comments: crepitus Cardiovascular:     Rate and Rhythm: Normal rate and regular rhythm.     Pulses: Normal pulses.     Heart sounds: Normal heart sounds.  Pulmonary:     Effort: Pulmonary effort is normal.     Breath sounds: Normal breath sounds.  Abdominal:     General: Bowel sounds are normal. There is no distension.     Palpations: Abdomen is soft. There is no  mass.     Tenderness: There is no abdominal tenderness. There is no guarding or rebound.  Musculoskeletal: Normal range of motion.  Lymphadenopathy:     Cervical: No cervical adenopathy.  Skin:    General: Skin is warm and dry.     Capillary Refill: Capillary refill takes less than 2 seconds.  Neurological:     General: No focal deficit present.     Mental Status: He is alert and oriented to person, place, and time. Mental status is at baseline.     Cranial Nerves: No cranial nerve deficit.     Sensory: No sensory deficit.     Motor: No weakness.     Coordination: Coordination normal.     Gait: Gait normal.     Deep Tendon Reflexes: Reflexes normal.  Psychiatric:        Behavior: Behavior normal.        Thought Content: Thought content normal.        Judgment: Judgment normal.     Comments: A bit anxious and jittery    Labs reviewed: Basic Metabolic Panel: Recent Labs    04/28/18 0841  NA 140  K 4.6  CL 108  CO2 27  GLUCOSE 96  BUN 16  CREATININE 0.91  CALCIUM 9.0   Liver Function Tests: Recent Labs    04/28/18 0841  AST 24  ALT 23  BILITOT 0.3  PROT 6.1   No results for input(s): LIPASE, AMYLASE in the last 8760 hours. No results for input(s): AMMONIA in the last 8760 hours. CBC: Recent Labs    04/28/18 0841  WBC 4.5  NEUTROABS 1,980  HGB 13.8  HCT 40.5  MCV 86.4  PLT 219    Cardiac Enzymes: No results for input(s): CKTOTAL, CKMB, CKMBINDEX, TROPONINI in the last 8760 hours. BNP: Invalid input(s): POCBNP Lab Results  Component Value Date   HGBA1C 5.5 04/28/2018   Lab Results  Component Value Date   TSH 0.90 11/02/2016   No results found for: VITAMINB12 No results found for: FOLATE No results found for: IRON, TIBC, FERRITIN  EKG reviewed today.    Assessment/Plan 1. Annual physical exam - performed today - CBC with Differential/Platelet; Future - COMPLETE METABOLIC PANEL WITH GFR; Future - Lipid panel; Future - Hemoglobin A1c; Future  2. Hyperlipidemia, unspecified hyperlipidemia type - cut down on cholesterol containing foods -continue exercise regimen -f/u labs  - EKG 12-Lead - Lipid panel; Future - Lipid panel; Future  3. Cervicalgia -cont current norco and exercise regimen  4. Obstructive sleep apnea (adult) (pediatric) -cont cpap qhs  5. Hyperglycemia -cont exercise regimen and avoiding a lot of sweets - Hemoglobin A1c; Future  6. High risk homosexual behavior - f/u labs annually for screening  - Hepatitis C antibody; Future - HIV Antibody (routine testing w rflx); Future - RPR; Future  Labs/tests ordered:   Orders Placed This Encounter  Procedures  . CBC with Differential/Platelet    Standing Status:   Future    Standing Expiration Date:   05/17/2019  . COMPLETE METABOLIC PANEL WITH GFR    Standing Status:   Future    Standing Expiration Date:   05/17/2019  . Lipid panel    Standing Status:   Future    Standing Expiration Date:   05/17/2019  . Hemoglobin A1c    Standing Status:   Future    Standing Expiration Date:   05/17/2019  . Hepatitis C antibody    Standing Status:   Future  Standing Expiration Date:   05/17/2019  . HIV Antibody (routine testing w rflx)    Standing Status:   Future    Standing Expiration Date:   05/17/2019  . RPR    Standing Status:   Future    Standing Expiration Date:   05/17/2019  .  Lipid panel    Standing Status:   Future    Standing Expiration Date:   05/17/2019    Order Specific Question:   Has the patient fasted?    Answer:   Yes  . EKG 12-Lead     Jeannie Mallinger L. Wyatt Thorstenson, D.O. Geriatrics [facility] [address], [place] [address] Cell Phone (Mon-Fri 8am-5pm):  [phone] On Call:  [phone] & follow prompts after 5pm & weekends Office Phone:  [phone] Office Fax:  [phone]

## 2018-05-16 NOTE — Patient Instructions (Signed)
Fat and Cholesterol Restricted Eating Plan  Eating a diet that limits fat and cholesterol may help lower your risk for heart disease and other conditions. Your body needs fat and cholesterol for basic functions, but eating too much of these things can be harmful to your health.  Your health care provider may order lab tests to check your blood fat (lipid) and cholesterol levels. This helps your health care provider understand your risk for certain conditions and whether you need to make diet changes. Work with your health care provider or dietitian to make an eating plan that is right for you.    What are tips for following this plan?  General guidelines    · If you are overweight, work with your health care provider to lose weight safely. Losing just 5-10% of your body weight can improve your overall health and help prevent diseases such as diabetes and heart disease.  · Avoid:  ? Foods with added sugar.  ? Fried foods.  ? Foods that contain partially hydrogenated oils, including stick margarine, some tub margarines, cookies, crackers, and other baked goods.  · Limit alcohol intake to no more than 1 drink a day for nonpregnant women and 2 drinks a day for men. One drink equals 12 oz of beer, 5 oz of wine, or 1½ oz of hard liquor.  Reading food labels  · Check food labels for:  ? Trans fats, partially hydrogenated oils, or high amounts of saturated fat. Avoid foods that contain saturated fat and trans fat.  ? The amount of cholesterol in each serving. Try to eat no more than 200 mg of cholesterol each day.  ? The amount of fiber in each serving. Try to eat at least 20-30 g of fiber each day.  · Choose foods with healthy fats, such as:  ? Monounsaturated and polyunsaturated fats. These include olive and canola oil, flaxseeds, walnuts, almonds, and seeds.  ? Omega-3 fats. These are found in foods such as salmon, mackerel, sardines, tuna, flaxseed oil, and ground flaxseeds.  · Choose grain products that have whole  grains. Look for the word "whole" as the first word in the ingredient list.  Cooking  · Cook foods using methods other than frying. Baking, boiling, grilling, and broiling are some healthy options.  · Eat more home-cooked food and less restaurant, buffet, and fast food.  · Avoid cooking using saturated fats.  ? Animal sources of saturated fats include meats, butter, and cream.  ? Plant sources of saturated fats include palm oil, palm kernel oil, and coconut oil.  Meal planning    · At meals, imagine dividing your plate into fourths:  ? Fill one-half of your plate with vegetables and green salads.  ? Fill one-fourth of your plate with whole grains.  ? Fill one-fourth of your plate with lean protein foods.  · Eat fish that is high in omega-3 fats at least two times a week.  · Eat more foods that contain fiber, such as whole grains, beans, apples, broccoli, carrots, peas, and barley. These foods help promote healthy cholesterol levels in the blood.  Recommended foods  Grains  · Whole grains, such as whole wheat or whole grain breads, crackers, cereals, and pasta. Unsweetened oatmeal, bulgur, barley, quinoa, or brown rice. Corn or whole wheat flour tortillas.  Vegetables  · Fresh or frozen vegetables (raw, steamed, roasted, or grilled). Green salads.  Fruits  · All fresh, canned (in natural juice), or frozen fruits.  Meats and other   protein foods  · Ground beef (85% or leaner), grass-fed beef, or beef trimmed of fat. Skinless chicken or turkey. Ground chicken or turkey. Pork trimmed of fat. All fish and seafood. Egg whites. Dried beans, peas, or lentils. Unsalted nuts or seeds. Unsalted canned beans. Natural nut butters without added sugar and oil.  Dairy  · Low-fat or nonfat dairy products, such as skim or 1% milk, 2% or reduced-fat cheeses, low-fat and fat-free ricotta or cottage cheese, or plain low-fat and nonfat yogurt.  Fats and oils  · Tub margarine without trans fats. Light or reduced-fat mayonnaise and salad  dressings. Avocado. Olive, canola, sesame, or safflower oils.  The items listed above may not be a complete list of recommended foods or beverages. Contact your dietitian for more options.  Foods to avoid  Grains  · White bread. White pasta. White rice. Cornbread. Bagels, pastries, and croissants. Crackers and snack foods that contain trans fat and hydrogenated oils.  Vegetables  · Vegetables cooked in cheese, cream, or butter sauce. Fried vegetables.  Fruits  · Canned fruit in heavy syrup. Fruit in cream or butter sauce. Fried fruit.  Meats and other protein foods  · Fatty cuts of meat. Ribs, chicken wings, bacon, sausage, bologna, salami, chitterlings, fatback, hot dogs, bratwurst, and packaged lunch meats. Liver and organ meats. Whole eggs and egg yolks. Chicken and turkey with skin. Fried meat.  Dairy  · Whole or 2% milk, cream, half-and-half, and cream cheese. Whole milk cheeses. Whole-fat or sweetened yogurt. Full-fat cheeses. Nondairy creamers and whipped toppings. Processed cheese, cheese spreads, and cheese curds.  Beverages  · Alcohol. Sugar-sweetened drinks such as sodas, lemonade, and fruit drinks.  Fats and oils  · Butter, stick margarine, lard, shortening, ghee, or bacon fat. Coconut, palm kernel, and palm oils.  Sweets and desserts  · Corn syrup, sugars, honey, and molasses. Candy. Jam and jelly. Syrup. Sweetened cereals. Cookies, pies, cakes, donuts, muffins, and ice cream.  The items listed above may not be a complete list of foods and beverages to avoid. Contact your dietitian for more information.  Summary  · Your body needs fat and cholesterol for basic functions. However, eating too much of these things can be harmful to your health.  · Work with your health care provider and dietitian to follow a diet low in fat and cholesterol. Doing this may help lower your risk for heart disease and other conditions.  · Choose healthy fats, such as monounsaturated and polyunsaturated fats, and foods high in  omega-3 fatty acids.  · Eat fiber-rich foods, such as whole grains, beans, peas, fruits, and vegetables.  · Limit or avoid alcohol, fried foods, and foods high in saturated fats, partially hydrogenated oils, and sugar.  This information is not intended to replace advice given to you by your health care provider. Make sure you discuss any questions you have with your health care provider.  Document Released: 03/09/2005 Document Revised: 11/24/2016 Document Reviewed: 11/24/2016  Elsevier Interactive Patient Education © 2019 [employer]

## 2018-05-25 ENCOUNTER — Other Ambulatory Visit: Payer: Self-pay | Admitting: *Deleted

## 2018-05-25 DIAGNOSIS — M542 Cervicalgia: Secondary | ICD-10-CM

## 2018-05-25 DIAGNOSIS — F411 Generalized anxiety disorder: Secondary | ICD-10-CM

## 2018-05-25 MED ORDER — LORAZEPAM 1 MG PO TABS: 1.0000 mg | ORAL_TABLET | Freq: Two times a day (BID) | ORAL | 0 refills | Status: DC | PRN

## 2018-05-25 MED ORDER — HYDROCODONE-ACETAMINOPHEN 7.5-325 MG PO TABS: ORAL_TABLET | ORAL | 0 refills | Status: DC

## 2018-05-25 NOTE — Telephone Encounter (Signed)
Patient requested Refill [facility] Database Verified LR: 04/27/18 Pended Rx and sent to Dr. Renato Gails for approval.

## 2018-06-24 ENCOUNTER — Other Ambulatory Visit: Payer: Self-pay | Admitting: *Deleted

## 2018-06-24 DIAGNOSIS — M542 Cervicalgia: Secondary | ICD-10-CM

## 2018-06-24 DIAGNOSIS — F411 Generalized anxiety disorder: Secondary | ICD-10-CM

## 2018-06-24 MED ORDER — LORAZEPAM 1 MG PO TABS: 1.0000 mg | ORAL_TABLET | Freq: Two times a day (BID) | ORAL | 0 refills | Status: DC | PRN

## 2018-06-24 MED ORDER — HYDROCODONE-ACETAMINOPHEN 7.5-325 MG PO TABS: ORAL_TABLET | ORAL | 0 refills | Status: DC

## 2018-06-24 NOTE — Telephone Encounter (Signed)
Patient requested Rx's [facility] Database Verified LR: 05/25/2018 Pended Rx and sent to Dr. Chales Abrahams for approval (Dr. Eldridge Abrahams).

## 2018-07-17 ENCOUNTER — Encounter: Payer: Self-pay | Admitting: Internal Medicine

## 2018-07-25 ENCOUNTER — Other Ambulatory Visit: Payer: Self-pay | Admitting: *Deleted

## 2018-07-25 DIAGNOSIS — F411 Generalized anxiety disorder: Secondary | ICD-10-CM

## 2018-07-25 DIAGNOSIS — M542 Cervicalgia: Secondary | ICD-10-CM

## 2018-07-25 MED ORDER — LORAZEPAM 1 MG PO TABS: 1.0000 mg | ORAL_TABLET | Freq: Two times a day (BID) | ORAL | 0 refills | Status: DC | PRN

## 2018-07-25 MED ORDER — HYDROCODONE-ACETAMINOPHEN 7.5-325 MG PO TABS: ORAL_TABLET | ORAL | 0 refills | Status: DC

## 2018-07-25 NOTE — Telephone Encounter (Signed)
Patient requested refill [place] Database Verified LR: 06/24/2018 Pended Rx and sent to Dr. Renato Gails for approval.

## 2018-08-24 ENCOUNTER — Other Ambulatory Visit: Payer: Self-pay | Admitting: *Deleted

## 2018-08-24 DIAGNOSIS — M542 Cervicalgia: Secondary | ICD-10-CM

## 2018-08-24 DIAGNOSIS — F411 Generalized anxiety disorder: Secondary | ICD-10-CM

## 2018-08-24 MED ORDER — HYDROCODONE-ACETAMINOPHEN 7.5-325 MG PO TABS: ORAL_TABLET | ORAL | 0 refills | Status: DC

## 2018-08-24 MED ORDER — LORAZEPAM 1 MG PO TABS: 1.0000 mg | ORAL_TABLET | Freq: Two times a day (BID) | ORAL | 0 refills | Status: DC | PRN

## 2018-08-24 NOTE — Telephone Encounter (Signed)
Patient requested refill [place] Database Verified LR: 07/25/2018 Pended Rx and sent to Dr. Renato Gails for approval.

## 2018-09-22 ENCOUNTER — Other Ambulatory Visit: Payer: Self-pay | Admitting: *Deleted

## 2018-09-22 DIAGNOSIS — F411 Generalized anxiety disorder: Secondary | ICD-10-CM

## 2018-09-22 DIAGNOSIS — M542 Cervicalgia: Secondary | ICD-10-CM

## 2018-09-22 MED ORDER — LORAZEPAM 1 MG PO TABS: 1.0000 mg | ORAL_TABLET | Freq: Two times a day (BID) | ORAL | 0 refills | Status: DC | PRN

## 2018-09-22 MED ORDER — HYDROCODONE-ACETAMINOPHEN 7.5-325 MG PO TABS: ORAL_TABLET | ORAL | 0 refills | Status: DC

## 2018-09-22 NOTE — Telephone Encounter (Signed)
Last filled 08/24/2018, Database checked and verified

## 2018-10-03 ENCOUNTER — Other Ambulatory Visit: Payer: Self-pay | Admitting: Internal Medicine

## 2018-10-03 DIAGNOSIS — Z206 Contact with and (suspected) exposure to human immunodeficiency virus [HIV]: Secondary | ICD-10-CM

## 2018-10-24 ENCOUNTER — Other Ambulatory Visit: Payer: Self-pay | Admitting: *Deleted

## 2018-10-24 DIAGNOSIS — F411 Generalized anxiety disorder: Secondary | ICD-10-CM

## 2018-10-24 DIAGNOSIS — M542 Cervicalgia: Secondary | ICD-10-CM

## 2018-10-24 MED ORDER — LORAZEPAM 1 MG PO TABS: 1.0000 mg | ORAL_TABLET | Freq: Two times a day (BID) | ORAL | 0 refills | Status: DC | PRN

## 2018-10-24 MED ORDER — HYDROCODONE-ACETAMINOPHEN 7.5-325 MG PO TABS: ORAL_TABLET | ORAL | 0 refills | Status: DC

## 2018-10-24 NOTE — Telephone Encounter (Signed)
Patient requested refill [facility] Database Verified LR: 09/22/2018 Pended Rx and sent to Dr. Mariea Clonts for approval.

## 2018-11-23 ENCOUNTER — Other Ambulatory Visit: Payer: Self-pay | Admitting: *Deleted

## 2018-11-23 DIAGNOSIS — M542 Cervicalgia: Secondary | ICD-10-CM

## 2018-11-23 DIAGNOSIS — F411 Generalized anxiety disorder: Secondary | ICD-10-CM

## 2018-11-23 MED ORDER — LORAZEPAM 1 MG PO TABS: 1.0000 mg | ORAL_TABLET | Freq: Two times a day (BID) | ORAL | 0 refills | Status: DC | PRN

## 2018-11-23 MED ORDER — HYDROCODONE-ACETAMINOPHEN 7.5-325 MG PO TABS: ORAL_TABLET | ORAL | 0 refills | Status: DC

## 2018-11-23 NOTE — Telephone Encounter (Signed)
Patient requested refills [place] Verified LR: 10/24/2018 Pended Rx and sent to Dr. Mariea Clonts for approval.

## 2018-12-23 ENCOUNTER — Other Ambulatory Visit: Payer: Self-pay | Admitting: *Deleted

## 2018-12-23 DIAGNOSIS — F411 Generalized anxiety disorder: Secondary | ICD-10-CM

## 2018-12-23 DIAGNOSIS — M542 Cervicalgia: Secondary | ICD-10-CM

## 2018-12-23 MED ORDER — LORAZEPAM 1 MG PO TABS: 1.0000 mg | ORAL_TABLET | Freq: Two times a day (BID) | ORAL | 0 refills | Status: DC | PRN

## 2018-12-23 MED ORDER — HYDROCODONE-ACETAMINOPHEN 7.5-325 MG PO TABS: ORAL_TABLET | ORAL | 0 refills | Status: DC

## 2018-12-23 NOTE — Telephone Encounter (Signed)
Patient requested refill [place] Database Verified LR: 11/23/2018 Pended Rx and sent to Dr. Mariea Clonts for approval.

## 2018-12-26 ENCOUNTER — Other Ambulatory Visit: Payer: Self-pay

## 2018-12-26 ENCOUNTER — Ambulatory Visit (INDEPENDENT_AMBULATORY_CARE_PROVIDER_SITE_OTHER): Payer: 59 | Admitting: Neurology

## 2018-12-26 ENCOUNTER — Encounter: Payer: Self-pay | Admitting: Neurology

## 2018-12-26 VITALS — BP 127/71 | HR 83 | Temp 97.8°F | Ht 72.0 in | Wt 193.0 lb

## 2018-12-26 DIAGNOSIS — Z9989 Dependence on other enabling machines and devices: Secondary | ICD-10-CM | POA: Diagnosis not present

## 2018-12-26 DIAGNOSIS — G4733 Obstructive sleep apnea (adult) (pediatric): Secondary | ICD-10-CM

## 2018-12-26 NOTE — Progress Notes (Signed)
SLEEP MEDICINE CLINIC   Provider:  Melvyn Novas, M D  Referring Provider: [name], DO Primary Care Physician:  [name], DO  Chief Complaint  Patient presents with  . Follow-up    pt alone, rm 11. DME [employer]. states things are going well, no issues    12-26-2018, 58 year old caucasian , right handed Male patient ,James [name]. situational depressed.  GDS 12/ 15 point. James [name] has is usually has high compliance with CPAP use with an average of 93% equal to 7 hours and 45 minutes at night.  CPAP is an air sense 10 AutoSet currently with a setting of 8 cm water pressure to centimeter EPR and a residual AHI of 1.4 which is an excellent resolution there are some airleak data however that seemed not to influence the apnea index.  I think the high current endorsement of the geriatric depression scale is more of a reactive situational score the fatigue severity is endorsed at 12 out of 60 years and would be much slightly higher if a true clinical depression would be present.  Epworth sleepiness course endorsed at 1 out of 24 points.    Follow up for this 58 year old caucasian male patient on CPAP.  12-21-2017, I have the pleasure of meeting James [name] today for his yearly CPAP compliance visit.  He has used a CPAP 80% for the last month but has been a gap of 3 days which is highly unusual and his average user time on days used is 7 hours and 45 minutes, he has a very low residual AHI of 1.4/h of sleep, set at 8 cmH2O was to submit a EPR, there are some high air leaks.  I have always felt that these were related to facial hair but he is using a nasal pillow.  12-16-2016 He reports from his travel adventure in [place]. James [name] just returned from a trip to [place], [name], with an average user time currently on days used of 8 hours and 40 minutes. He used to machine 18  out of 30 days again this was during his travel time- he used his old CPAP as his travel CPAP ( OSA treated since 2002). CPAP is set at 8:30 meter water with 3 cm EPR residual AHI is only 1.8 there are some higher air leaks probably attributable to his facial hair. That needs to be no change in settings. Epworth sleepiness score is 2 points fatigue severity 12 points also no concern from the side. I would like for him to continue using CPAP nightly.     Referral from Dr. Renato Gails for a sleep consultation. James [name] is a right-handed Caucasian gentleman who underwent a sleep study on 08-23-09 at [facility],  which documented an AHI of 22.3. His primary care physician had referred him after her the patient was to hold during a camping trip that fellow travelers had noticed him to snore loudly and have frequent apneas. He also has severe restless legs. He returned for a titration study on 11-22-2009 and a respironics nasal pillow in  medium size was used to. The patient was titrated to 8 cm water, at which pressure his AHI current of 0.0. He was able to sleep under 10 minutes under the pressure. Some snoring was still noted but to a much lower volume.  Bisacodyl has been compliant he using her  CPAP ever since and for the first time today I see her download.  He had been lost in followup also he tried to make an appointment with our sleep clinic.  The CPAP download dated 11-05-13 encompassed  90 days,  an average daily user time of 7 hours and 39 minutes , 100% compliance of 4 hours. The machine is set at 8 cm of water without EPR and his residual AHI is 1.6. In spite of being on CPAP, he cannot sleep in the same bed with his partner, reporting RLS.  It seems, he has not RLS, but PLM s, bothering his partner.  The patient works at night, goes to bed at 1.30 AM and once he puts his CPAP on, he is promptly asleep. He never needs an alarm clock to rise. Sleeps from 1.45 to 9.30 and mainly uninterrputed, only one  nocturia.  Work starts at 2.30 PM.  He drinks sweet iced tea, mainly water.   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 He is 100% compliant with the use of CPAP over 4 hours nightly on a 30 day download obtained in office today. Average user time is 7 hours 59 minutes set pressure is 8 cm water without EPR and AHI is 1.9. He does have some air leaks but this is related to his facial hair. Always some Epworth sleepiness score is endorsed at 0. Fatigue severity is endorsed at 0. The patient's partner sleeps in a different room actually on a different floor of the house. Was begun but the patient was still snoring and kicking a lot at night, so he is not able to bear witness to how the sleep habits have changed. A she uses a 60 year old ResMed machine and he would like to have a more trouble friendly option available lighter and perhaps quieter. The patient has noticed that the machine is noisier than it used to be. He would also like to obtain a car [employer] to be able to camp and uses CPAP machine. In addition he travels long distances and would like to have a CPAP machine that would be usable on a  Plane. 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  Interval history 12/23/2015, The pleasure of seeing [name], today for a CPAP compliance visit his residual AHI is 1.78 cm water pressure without EPR and he has used the machine 100% of the last 30 days and each of those nights over 4 hours. His average user time is 8 hours and 7 minutes. No adjustments have to be made. He does have some higher air leaks but he is not bothered by it. He does like to use a nasal pillow. His Epworth sleepiness score is endorsed at 0,  The patient has a history of childhood asthma and was a smoker for many  ( 20 ?) years. Quit in 2001.   Review of Systems: Out of a  complete 14 system review, the patient complains of only the following symptoms, and all other reviewed systems are negative.  tinnitus in the left, some fatigue related to shift work.   Epworth score 1 points.  , Fatigue severity score zero , depression score 12/ 15 points.    Social History   Socioeconomic History  . Marital status: Married    Spouse name: Rosey Bath  .  Number of children: 0  . Years of education: 12  . Highest education level: Not on file  Occupational History  . Occupation: Marine scientist: [employer]  Social Needs  . Financial resource strain: Not on file  . Food insecurity    Worry: Not on file    Inability: Not on file  . Transportation needs    Medical: Not on file    Non-medical: Not on file  Tobacco Use  . Smoking status: Former Games developer  . Smokeless tobacco: Never Used  Substance and Sexual Activity  . Alcohol use: Yes    Comment: rarely  . Drug use: No  . Sexual activity: Not on file  Lifestyle  . Physical activity    Days per week: Not on file    Minutes per session: Not on file  . Stress: Not on file  Relationships  . Social Musician on phone: Not on file    Gets together: Not on file    Attends religious service: Not on file    Active member of club or organization: Not on file    Attends meetings of clubs or organizations: Not on file    Relationship status: Not on file  . Intimate partner violence    Fear of current or ex partner: Not on file    Emotionally abused: Not on file    Physically abused: Not on file    Forced sexual activity: Not on file  Other Topics Concern  . Not on file  Social History Narrative   Patient is married ([name]) and lives at home with his husband.   Patient has a college education.   Patient works full-time ([employer]).   Patient is right-handed.   Patient drinks one cup of coffee daily.    Family History  Problem Relation Age of Onset  . Heart disease  Mother   . Hypertension Mother   . Diabetes Mother   . Depression Mother   . Cancer Father        2004 bladder cancer  . Alcohol abuse Father     Past Medical History:  Diagnosis Date  . Anxiety   . Asthma   . BPH (benign prostatic hyperplasia)   . Cervicalgia    degenerative changes and foraminal stenoses  . Chest pain, unspecified   . Depression   . Family history of colonic polyps    Father with benign colon polyp  . Headache, tension type, chronic 01/2012  . Hepatitis B antibody positive 1984  . Hiatal hernia 09/19/10  . Hyperglycemia    104 on 10/10/10  . Hyperlipidemia   . Obstructive sleep apnea (adult) (pediatric)    using CPAP  . Other malaise and fatigue   . Supraventricular premature beats    noted on EKG 05/18/12  . Wrist pain, left     Past Surgical History:  Procedure Laterality Date  . COLON SURGERY  09/17/2008   poylps/hemorriods  . WISDOM TOOTH EXTRACTION  03/2009    Current Outpatient Medications  Medication Sig Dispense Refill  . EPINEPHrine 0.3 mg/0.3 mL IJ SOAJ injection Inject 0.70ml into the muscle as needed for allergic reaction 2 Device 3  . HYDROcodone-acetaminophen (NORCO) 7.5-325 MG tablet Take one tablet every 4 hours as needed for pain 180 tablet 0  . LORazepam (ATIVAN) 1 MG tablet Take 1 tablet (1 mg total) by mouth 2 (two) times daily as needed. for anxiety 60 tablet  0  . montelukast (SINGULAIR) 10 MG tablet TAKE 1 TABLET(10 MG) BY MOUTH AT BEDTIME 30 tablet 2  . sildenafil (REVATIO) 20 MG tablet Take 2-5 tablets prior to intercourse 50 tablet 4  . TRUVADA 200-300 MG tablet TAKE 1 TABLET DAILY TO PREVENT INFECTION 90 tablet 3   No current facility-administered medications for this visit.     Allergies as of 12/26/2018 - Review Complete 12/26/2018  Allergen Reaction Noted  . Bee venom Anaphylaxis 02/06/2015  . Codeine Other (See Comments) 06/20/2014  . Pyridoxine Other (See Comments) 06/20/2014  . Skelaxin [metaxalone] Other (See  Comments) 06/06/2013  . Augmentin [amoxicillin-pot clavulanate] Nausea And Vomiting 06/06/2013  . Sedapap [butalbital-acetaminophen] Other (See Comments) 06/06/2013    Vitals: BP 127/71   Pulse 83   Temp 97.8 F (36.6 C)   Ht 6' (1.829 m)   Wt 193 lb (87.5 kg)   BMI 26.18 kg/m  Last Weight:  Wt Readings from Last 1 Encounters:  12/26/18 193 lb (87.5 kg)       Last Height:   Ht Readings from Last 1 Encounters:  12/26/18 6' (1.829 m)    Physical exam:  General: The patient is awake, alert and appears not in acute distress.  No interval medical problem.  The patient is well groomed. Head: Normocephalic, atraumatic. Neck is supple. Mallampati 2   neck circumference: 15 inches . Nasal airflow restricted, mustache, TMJ is evident. Retrognathia is not seen.  Cardiovascular: Regular rate and rhythm , without murmurs or carotid bruit, and without distended neck veins. Respiratory: Lungs are clear to auscultation. Skin:  Without evidence of edema or rash. Trunk: BMI is elevated and patient has normal posture.  Neurologic exam : The patient is awake and alert, oriented to place and time.   Memory subjective described as intact.   Cranial nerves:no loss of smell and tatste.  Pupils are equal and briskly reactive to light.  Extraocular movements  in vertical and horizontal planes intact and without nystagmus. Visual fields by finger perimetry are intact. Hearing to finger rub intact. Facial sensation intact to fine touch.  Facial motor strength is symmetric wiht tongue and uvula moving in midline. Motor exam: Normal tone  muscle bulk and symmetric, strength in all extremities. Sensory: Fine touch, pinprick and vibration were tested in all extremities.   Assessment: After physical and neurologic examination, review of laboratory studies, imaging, neurophysiology testing and pre-existing records, assessment is   1) OSA - very well controlled on CPAP - 100% compliance - 80% on home  machine.  He uses 2 different CPAPs.- one for travel and one for home use.   The patient was advised of the nature of the diagnosed sleep disorder , the treatment options and risks for general a health and wellness arising from not treating the condition. Visit duration was 20 minutes.   Plan:  RV once a year . CPAP compliance remains high.   PS : he asked about a PTSD counselor- I still would like him to see someone- but I have to think about who this should be.     Porfirio Mylararmen Yandriel Boening MD  12/26/2018

## 2019-01-24 ENCOUNTER — Other Ambulatory Visit: Payer: Self-pay | Admitting: *Deleted

## 2019-01-24 DIAGNOSIS — F411 Generalized anxiety disorder: Secondary | ICD-10-CM

## 2019-01-24 DIAGNOSIS — M542 Cervicalgia: Secondary | ICD-10-CM

## 2019-01-24 MED ORDER — HYDROCODONE-ACETAMINOPHEN 7.5-325 MG PO TABS: ORAL_TABLET | ORAL | 0 refills | Status: DC

## 2019-01-24 MED ORDER — LORAZEPAM 1 MG PO TABS: 1.0000 mg | ORAL_TABLET | Freq: Two times a day (BID) | ORAL | 0 refills | Status: DC | PRN

## 2019-01-24 NOTE — Telephone Encounter (Signed)
Patient requested refill [place] Verified LR: 12/23/2018 Pended Rx and sent to Dr. Mariea Clonts for approval.

## 2019-01-31 LAB — HM COLONOSCOPY

## 2019-02-14 ENCOUNTER — Encounter: Payer: Self-pay | Admitting: *Deleted

## 2019-02-22 ENCOUNTER — Other Ambulatory Visit: Payer: Self-pay | Admitting: *Deleted

## 2019-02-22 DIAGNOSIS — M542 Cervicalgia: Secondary | ICD-10-CM

## 2019-02-22 DIAGNOSIS — F411 Generalized anxiety disorder: Secondary | ICD-10-CM

## 2019-02-22 MED ORDER — HYDROCODONE-ACETAMINOPHEN 7.5-325 MG PO TABS: ORAL_TABLET | ORAL | 0 refills | Status: DC

## 2019-02-22 MED ORDER — LORAZEPAM 1 MG PO TABS: 1.0000 mg | ORAL_TABLET | Freq: Two times a day (BID) | ORAL | 0 refills | Status: DC | PRN

## 2019-02-22 NOTE — Telephone Encounter (Signed)
Patient Requested refill NCCSRS Database Verified LR: 01/24/2019 Pended Rx and sent to Dr Mariea Clonts for approval.  Appointment scheduled for 03/02/2019 for update Narcotic Contract.

## 2019-03-02 ENCOUNTER — Ambulatory Visit: Payer: Self-pay | Admitting: Internal Medicine

## 2019-03-02 ENCOUNTER — Encounter: Payer: Self-pay | Admitting: Nurse Practitioner

## 2019-03-02 ENCOUNTER — Other Ambulatory Visit: Payer: Self-pay

## 2019-03-02 ENCOUNTER — Ambulatory Visit (INDEPENDENT_AMBULATORY_CARE_PROVIDER_SITE_OTHER): Payer: 59 | Admitting: Nurse Practitioner

## 2019-03-02 VITALS — Ht 72.0 in | Wt 193.0 lb

## 2019-03-02 DIAGNOSIS — F411 Generalized anxiety disorder: Secondary | ICD-10-CM | POA: Diagnosis not present

## 2019-03-02 DIAGNOSIS — M542 Cervicalgia: Secondary | ICD-10-CM | POA: Diagnosis not present

## 2019-03-02 MED ORDER — DULOXETINE HCL 30 MG PO CPEP: 30.0000 mg | ORAL_CAPSULE | Freq: Every day | ORAL | 1 refills | Status: DC

## 2019-03-02 NOTE — Progress Notes (Signed)
This service is provided via telemedicine  No vital signs collected/recorded due to the encounter was a telemedicine visit.   Location of patient (ex: home, work):  Home  Patient consents to a telephone visit:  Yes  Location of the provider (ex: office, home):  [facility]  Name of any referring provider:  Bufford Spikesiffany Reed, DO  Names of all persons participating in the telemedicine service and their role in the encounter:  Karin LieuBridget Bailey, CMA; Abbey ChattersJessica Kabella Cassidy, NP  Time spent on call:  6.27 minutes CMA time only      Careteam: Patient Care Team: Kermit Baloeed, Tiffany L, DO as PCP - General (Geriatric Medicine)  Advanced Directive information    Allergies  Allergen Reactions  . Bee Venom Anaphylaxis  . Codeine Other (See Comments)  . Pyridoxine Other (See Comments)    Made him crazy  . Skelaxin [Metaxalone] Other (See Comments)  . Augmentin [Amoxicillin-Pot Clavulanate] Nausea And Vomiting  . Sedapap [Butalbital-Acetaminophen] Other (See Comments)    Chief Complaint  Patient presents with  . Medical Management of Chronic Issues    opioid/nonopioid contract.      HPI: Patient is a 58 y.o. male via televisit for pain management.   Takes hydrocodone-apap 7.5-325 mg by mouth due to neck pain. Has been on for several years.  It wakes him up at night and he feels like if he picks his head up then he feels like he can not. Reports he is taking medication 5 times a day, very painful if he does not take.  Gets medication every month.  Takes all medication that is prescribed. Takes it as prescribed.  Reports he waits as late in the day to take medication but becomes more painful at night. Previously went to gym to exercise which would help the neck.  Also takes ativan due to anxiety- COVID has really effected him. He is not allowed to go to the gym or have a social life which has made it worse. Feels like overall anxiety is worse. Tries to get outside more and avoid social  media.  Review of Systems:  Review of Systems  Constitutional: Negative for chills and fever.  Respiratory: Negative for shortness of breath.   Cardiovascular: Negative for chest pain and palpitations.  Musculoskeletal: Positive for myalgias and neck pain.  Neurological: Negative for dizziness, seizures and headaches.  Psychiatric/Behavioral: The patient is nervous/anxious.     Past Medical History:  Diagnosis Date  . Anxiety   . Asthma   . BPH (benign prostatic hyperplasia)   . Cervicalgia    degenerative changes and foraminal stenoses  . Chest pain, unspecified   . Depression   . Family history of colonic polyps    Father with benign colon polyp  . Headache, tension type, chronic 01/2012  . Hepatitis B antibody positive 1984  . Hiatal hernia 09/19/10  . Hyperglycemia    104 on 10/10/10  . Hyperlipidemia   . Obstructive sleep apnea (adult) (pediatric)    using CPAP  . Other malaise and fatigue   . Supraventricular premature beats    noted on EKG 05/18/12  . Wrist pain, left    Past Surgical History:  Procedure Laterality Date  . COLON SURGERY  09/17/2008   poylps/hemorriods  . WISDOM TOOTH EXTRACTION  03/2009   Social History:   reports that he has quit smoking. He has never used smokeless tobacco. He reports current alcohol use. He reports that he does not use drugs.  Family History  Problem Relation Age of Onset  . Heart disease Mother   . Hypertension Mother   . Diabetes Mother   . Depression Mother   . Cancer Father        2004 bladder cancer  . Alcohol abuse Father     Medications: Patient's Medications  New Prescriptions   DULOXETINE (CYMBALTA) 30 MG CAPSULE    Take 1 capsule (30 mg total) by mouth daily.  Previous Medications   ASPIRIN EC 81 MG TABLET    Take 81 mg by mouth every other day.   CHOLECALCIFEROL (VITAMIN D3) 25 MCG (1000 UT) TABLET    Take 1,000 Units by mouth daily.   EPINEPHRINE 0.3 MG/0.3 ML IJ SOAJ INJECTION    Inject 0.24ml into the  muscle as needed for allergic reaction   HYDROCODONE-ACETAMINOPHEN (NORCO) 7.5-325 MG TABLET    Take one tablet every 4 hours as needed for pain   LORAZEPAM (ATIVAN) 1 MG TABLET    Take 1 tablet (1 mg total) by mouth 2 (two) times daily as needed. for anxiety   MONTELUKAST (SINGULAIR) 10 MG TABLET    TAKE 1 TABLET(10 MG) BY MOUTH AT BEDTIME   MULTIPLE VITAMIN (MULTIVITAMIN) TABLET    Take 1 tablet by mouth daily.   SILDENAFIL (REVATIO) 20 MG TABLET    Take 2-5 tablets prior to intercourse   TRUVADA 200-300 MG TABLET    TAKE 1 TABLET DAILY TO PREVENT INFECTION   ZINC GLUCONATE 50 MG TABLET    Take 50 mg by mouth daily.  Modified Medications   No medications on file  Discontinued Medications   No medications on file    Physical Exam:  Vitals:   03/02/19 0928  Weight: 193 lb (87.5 kg)  Height: 6' (1.829 m)   Body mass index is 26.18 kg/m. Wt Readings from Last 3 Encounters:  03/02/19 193 lb (87.5 kg)  12/26/18 193 lb (87.5 kg)  05/16/18 194 lb (88 kg)      Labs reviewed: Basic Metabolic Panel: Recent Labs    04/28/18 0841  NA 140  K 4.6  CL 108  CO2 27  GLUCOSE 96  BUN 16  CREATININE 0.91  CALCIUM 9.0   Liver Function Tests: Recent Labs    04/28/18 0841  AST 24  ALT 23  BILITOT 0.3  PROT 6.1   No results for input(s): LIPASE, AMYLASE in the last 8760 hours. No results for input(s): AMMONIA in the last 8760 hours. CBC: Recent Labs    04/28/18 0841  WBC 4.5  NEUTROABS 1,980  HGB 13.8  HCT 40.5  MCV 86.4  PLT 219   Lipid Panel: Recent Labs    04/28/18 0841  CHOL 186  HDL 49  LDLCALC 118*  TRIG 88  CHOLHDL 3.8   TSH: No results for input(s): TSH in the last 8760 hours. A1C: Lab Results  Component Value Date   HGBA1C 5.5 04/28/2018     Assessment/Plan 1. Cervicalgia -review risk of taking long term opioids, he is at risk for adverse event due to opioid use with ativan use. Pt aware of this. Encouraged to restart exercise to minimized  pain in neck.  Will start cymbalta to help minimize use of ativan and hopefully he will get some pain benefit as well. -he will come to the office in the morning to sign opioid contract - DULoxetine (CYMBALTA) 30 MG capsule; Take 1 capsule (30 mg total) by mouth daily.  Dispense: 30 capsule; Refill: 1 - UDS; Future  2. Anxiety state -worse due to COVID and social isolation. Encouraged to increase physical activity, get outside more, mediatation can be beneficial.  To start cymbalta -once on cymbalta can attempt to cut back on ativan use.  -recommended cutting ativan in half to start and take twice daily, then cut back to half once daily and if possible only use when needed for extreme anxiety - DULoxetine (CYMBALTA) 30 MG capsule; Take 1 capsule (30 mg total) by mouth daily.  Dispense: 30 capsule; Refill: 1 - UDS; Future  Next appt: 4 weeks with Dr Mariea Clonts for anxiety -to come into office for urine drug screen and to sign opioid and non-opioid controlled substance contract  Tamy Accardo K. Harle Battiest  [facility] [phone]     Virtual Visit via Telephone Note  I connected with pt on 03/02/19 at  9:30 AM EST by telephone and verified that I am speaking with the correct person using two identifiers.  Location: Patient: home Provider: [facility].    I discussed the limitations, risks, security and privacy concerns of performing an evaluation and management service by telephone and the availability of in person appointments. I also discussed with the patient that there may be a patient responsible charge related to this service. The patient expressed understanding and agreed to proceed.   I discussed the assessment and treatment plan with the patient. The patient was provided an opportunity to ask questions and all were answered. The patient agreed with the plan and demonstrated an understanding of the instructions.   The patient was advised to call back  or seek an in-person evaluation if the symptoms worsen or if the condition fails to improve as anticipated.  I provided 25 minutes of non-face-to-face time during this encounter.  Carlos American. Harle Battiest Avs printed and mailed

## 2019-03-02 NOTE — Patient Instructions (Addendum)
To continue to try to get outside Try to increase physical activity, 30 mins 5 days a week.  To start cymbalta 30 mg daily- it may take 4-6 weeks to see max effects but to try to start cutting back on ativan once you have been on cymbalta for a few weeks. Can try cutting ativan in half to start and take twice daily, then cut back to half once daily and if possible only use when needed for extreme anxiety  To follow up in 4 weeks with Dr Mariea Clonts to see how this is going.

## 2019-03-03 ENCOUNTER — Other Ambulatory Visit: Payer: Self-pay

## 2019-03-03 ENCOUNTER — Other Ambulatory Visit: Payer: 59

## 2019-03-03 DIAGNOSIS — R739 Hyperglycemia, unspecified: Secondary | ICD-10-CM

## 2019-03-03 DIAGNOSIS — F411 Generalized anxiety disorder: Secondary | ICD-10-CM

## 2019-03-03 DIAGNOSIS — Z7252 High risk homosexual behavior: Secondary | ICD-10-CM

## 2019-03-03 DIAGNOSIS — M542 Cervicalgia: Secondary | ICD-10-CM

## 2019-03-03 DIAGNOSIS — E785 Hyperlipidemia, unspecified: Secondary | ICD-10-CM

## 2019-03-03 DIAGNOSIS — Z0000 Encounter for general adult medical examination without abnormal findings: Secondary | ICD-10-CM

## 2019-03-06 LAB — CBC WITH DIFFERENTIAL/PLATELET
Absolute Monocytes: 428 cells/uL (ref 200–950)
Basophils Absolute: 40 cells/uL (ref 0–200)
Basophils Relative: 0.7 %
Eosinophils Absolute: 296 cells/uL (ref 15–500)
Eosinophils Relative: 5.2 %
HCT: 42 % (ref 38.5–50.0)
Hemoglobin: 14 g/dL (ref 13.2–17.1)
Lymphs Abs: 1915 cells/uL (ref 850–3900)
MCH: 28.2 pg (ref 27.0–33.0)
MCHC: 33.3 g/dL (ref 32.0–36.0)
MCV: 84.7 fL (ref 80.0–100.0)
MPV: 11.2 fL (ref 7.5–12.5)
Monocytes Relative: 7.5 %
Neutro Abs: 3021 cells/uL (ref 1500–7800)
Neutrophils Relative %: 53 %
Platelets: 193 10*3/uL (ref 140–400)
RBC: 4.96 10*6/uL (ref 4.20–5.80)
RDW: 13.2 % (ref 11.0–15.0)
Total Lymphocyte: 33.6 %
WBC: 5.7 10*3/uL (ref 3.8–10.8)

## 2019-03-06 LAB — COMPLETE METABOLIC PANEL WITH GFR
AG Ratio: 1.6 (calc) (ref 1.0–2.5)
ALT: 15 U/L (ref 9–46)
AST: 18 U/L (ref 10–35)
Albumin: 4.1 g/dL (ref 3.6–5.1)
Alkaline phosphatase (APISO): 44 U/L (ref 35–144)
BUN: 13 mg/dL (ref 7–25)
CO2: 23 mmol/L (ref 20–32)
Calcium: 9.2 mg/dL (ref 8.6–10.3)
Chloride: 106 mmol/L (ref 98–110)
Creat: 0.86 mg/dL (ref 0.70–1.25)
GFR, Est African American: 109 mL/min/{1.73_m2} (ref >=60)
GFR, Est Non African American: 94 mL/min/{1.73_m2} (ref >=60)
Globulin: 2.5 g/dL (calc) (ref 1.9–3.7)
Glucose, Bld: 105 mg/dL — ABNORMAL HIGH (ref 65–99)
Potassium: 4.4 mmol/L (ref 3.5–5.3)
Sodium: 139 mmol/L (ref 135–146)
Total Bilirubin: 0.5 mg/dL (ref 0.2–1.2)
Total Protein: 6.6 g/dL (ref 6.1–8.1)

## 2019-03-06 LAB — PAIN MGMT, PROFILE 1 W/O CONF, U
Amphetamines: NEGATIVE ng/mL
Barbiturates: NEGATIVE ng/mL
Benzodiazepines: POSITIVE ng/mL
Cocaine Metabolite: NEGATIVE ng/mL
Creatinine: 112.2 mg/dL
Marijuana Metabolite: NEGATIVE ng/mL
Methadone Metabolite: NEGATIVE ng/mL
Opiates: POSITIVE ng/mL
Oxidant: NEGATIVE ug/mL
Oxycodone: NEGATIVE ng/mL
Phencyclidine: NEGATIVE ng/mL
pH: 7.3 (ref 4.5–9.0)

## 2019-03-06 LAB — HEPATITIS C ANTIBODY
Hepatitis C Ab: NONREACTIVE
SIGNAL TO CUT-OFF: 0.28 (ref <1.00)

## 2019-03-06 LAB — LIPID PANEL
Cholesterol: 238 mg/dL — ABNORMAL HIGH (ref <200)
HDL: 73 mg/dL (ref >=40)
LDL Cholesterol (Calc): 146 mg/dL (calc) — ABNORMAL HIGH
Non-HDL Cholesterol (Calc): 165 mg/dL (calc) — ABNORMAL HIGH (ref <130)
Total CHOL/HDL Ratio: 3.3 (calc) (ref <5.0)
Triglycerides: 87 mg/dL (ref <150)

## 2019-03-06 LAB — RPR: RPR Ser Ql: NONREACTIVE

## 2019-03-06 LAB — HEMOGLOBIN A1C
Hgb A1c MFr Bld: 5.4 % of total Hgb (ref <5.7)
Mean Plasma Glucose: 108 (calc)
eAG (mmol/L): 6 (calc)

## 2019-03-06 LAB — HIV ANTIBODY (ROUTINE TESTING W REFLEX): HIV 1&2 Ab, 4th Generation: NONREACTIVE

## 2019-03-06 NOTE — Progress Notes (Signed)
This encounter was created in error - please disregard.

## 2019-03-27 ENCOUNTER — Other Ambulatory Visit: Payer: Self-pay

## 2019-03-27 DIAGNOSIS — F411 Generalized anxiety disorder: Secondary | ICD-10-CM

## 2019-03-27 DIAGNOSIS — M542 Cervicalgia: Secondary | ICD-10-CM

## 2019-03-27 MED ORDER — LORAZEPAM 1 MG PO TABS: 1.0000 mg | ORAL_TABLET | Freq: Two times a day (BID) | ORAL | 0 refills | Status: DC | PRN

## 2019-03-27 MED ORDER — HYDROCODONE-ACETAMINOPHEN 7.5-325 MG PO TABS: ORAL_TABLET | ORAL | 0 refills | Status: DC

## 2019-03-27 NOTE — Telephone Encounter (Signed)
Patient called and states he wants refill on Hydrocodone and Lorazepam. Unable to check NCCSRS data base. Patient last refill was 02/22/2019. Medications pended and sent to Dr.Reed for approval. Patient Narcotic Contract updated on 03/02/2019.

## 2019-04-26 ENCOUNTER — Other Ambulatory Visit: Payer: Self-pay | Admitting: *Deleted

## 2019-04-26 DIAGNOSIS — F411 Generalized anxiety disorder: Secondary | ICD-10-CM

## 2019-04-26 DIAGNOSIS — M542 Cervicalgia: Secondary | ICD-10-CM

## 2019-04-26 MED ORDER — LORAZEPAM 1 MG PO TABS: 1.0000 mg | ORAL_TABLET | Freq: Two times a day (BID) | ORAL | 0 refills | Status: DC | PRN

## 2019-04-26 MED ORDER — HYDROCODONE-ACETAMINOPHEN 7.5-325 MG PO TABS: ORAL_TABLET | ORAL | 0 refills | Status: DC

## 2019-04-26 NOTE — Telephone Encounter (Signed)
Patient requested refill NCCSRS Database Verified LR: 03/27/2019 Narcotic Contract updated.  Pended Rx and sent to Dr. Renato Gails for approval.

## 2019-05-23 ENCOUNTER — Other Ambulatory Visit: Payer: Self-pay

## 2019-05-23 DIAGNOSIS — F411 Generalized anxiety disorder: Secondary | ICD-10-CM

## 2019-05-23 DIAGNOSIS — M542 Cervicalgia: Secondary | ICD-10-CM

## 2019-05-23 MED ORDER — HYDROCODONE-ACETAMINOPHEN 7.5-325 MG PO TABS: ORAL_TABLET | ORAL | 0 refills | Status: DC

## 2019-05-23 MED ORDER — LORAZEPAM 1 MG PO TABS: 1.0000 mg | ORAL_TABLET | Freq: Two times a day (BID) | ORAL | 0 refills | Status: DC | PRN

## 2019-05-23 NOTE — Telephone Encounter (Signed)
Incoming call received requesting refill on Hydrocodone and Lorazepam.   Both rx's last filled 04/26/19  Opioid and Non-Opioid treatment agreement on file from December 2020.

## 2019-05-23 NOTE — Telephone Encounter (Signed)
Patient aware rx's sent to pharmacy ? ?

## 2019-06-26 ENCOUNTER — Other Ambulatory Visit: Payer: Self-pay

## 2019-06-26 DIAGNOSIS — M542 Cervicalgia: Secondary | ICD-10-CM

## 2019-06-26 DIAGNOSIS — F411 Generalized anxiety disorder: Secondary | ICD-10-CM

## 2019-06-26 MED ORDER — LORAZEPAM 1 MG PO TABS: 1.0000 mg | ORAL_TABLET | Freq: Two times a day (BID) | ORAL | 0 refills | Status: DC | PRN

## 2019-06-26 MED ORDER — HYDROCODONE-ACETAMINOPHEN 7.5-325 MG PO TABS: ORAL_TABLET | ORAL | 0 refills | Status: DC

## 2019-06-26 NOTE — Telephone Encounter (Signed)
Patient called and states that he needs refills on medications "Hydrocodone" and "Lorazepam". I informed patient that he just had refills on 05/23/2019. Patient states he still would like PCP  Reed, Tiffany L, DO to see if he can have refills. Patient states he's on his last 2 pills. Patient had Opioid and Non Opioid agreement on file. Please Advise. Medications pend and sent to provider.

## 2019-07-27 ENCOUNTER — Other Ambulatory Visit: Payer: Self-pay | Admitting: *Deleted

## 2019-07-27 DIAGNOSIS — F411 Generalized anxiety disorder: Secondary | ICD-10-CM

## 2019-07-27 DIAGNOSIS — M542 Cervicalgia: Secondary | ICD-10-CM

## 2019-07-27 MED ORDER — LORAZEPAM 1 MG PO TABS: 1.0000 mg | ORAL_TABLET | Freq: Two times a day (BID) | ORAL | 0 refills | Status: DC | PRN

## 2019-07-27 MED ORDER — HYDROCODONE-ACETAMINOPHEN 7.5-325 MG PO TABS: ORAL_TABLET | ORAL | 0 refills | Status: DC

## 2019-07-27 NOTE — Telephone Encounter (Signed)
Patient requested refill Unable to Verified NCCSRS (unable to log in) Epic LR: 06/26/19 Contract updated.   Pended Rx and sent to Dr. Renato Gails for approval.

## 2019-08-28 ENCOUNTER — Other Ambulatory Visit: Payer: Self-pay | Admitting: *Deleted

## 2019-08-28 DIAGNOSIS — F411 Generalized anxiety disorder: Secondary | ICD-10-CM

## 2019-08-28 DIAGNOSIS — M542 Cervicalgia: Secondary | ICD-10-CM

## 2019-08-28 MED ORDER — LORAZEPAM 1 MG PO TABS: 1.0000 mg | ORAL_TABLET | Freq: Two times a day (BID) | ORAL | 0 refills | Status: DC | PRN

## 2019-08-28 MED ORDER — HYDROCODONE-ACETAMINOPHEN 7.5-325 MG PO TABS: ORAL_TABLET | ORAL | 0 refills | Status: DC

## 2019-08-28 NOTE — Telephone Encounter (Signed)
Patient requested refill.  Pended Rx and sent to Dr. Renato Gails for approval NCCSRS Database Verified LR: 07/27/2019

## 2019-09-13 ENCOUNTER — Other Ambulatory Visit: Payer: Self-pay | Admitting: Internal Medicine

## 2019-09-13 DIAGNOSIS — Z206 Contact with and (suspected) exposure to human immunodeficiency virus [HIV]: Secondary | ICD-10-CM

## 2019-09-29 ENCOUNTER — Other Ambulatory Visit: Payer: Self-pay | Admitting: *Deleted

## 2019-09-29 DIAGNOSIS — M542 Cervicalgia: Secondary | ICD-10-CM

## 2019-09-29 DIAGNOSIS — F411 Generalized anxiety disorder: Secondary | ICD-10-CM

## 2019-09-29 MED ORDER — LORAZEPAM 1 MG PO TABS: 1.0000 mg | ORAL_TABLET | Freq: Two times a day (BID) | ORAL | 0 refills | Status: DC | PRN

## 2019-09-29 MED ORDER — HYDROCODONE-ACETAMINOPHEN 7.5-325 MG PO TABS: ORAL_TABLET | ORAL | 0 refills | Status: DC

## 2019-09-29 NOTE — Telephone Encounter (Signed)
Patient called and requested refills. Pended and sent to Dr. Renato Gails for approval.  Epic LR: 08/28/19

## 2019-10-30 ENCOUNTER — Other Ambulatory Visit: Payer: Self-pay | Admitting: *Deleted

## 2019-10-30 DIAGNOSIS — F411 Generalized anxiety disorder: Secondary | ICD-10-CM

## 2019-10-30 DIAGNOSIS — M542 Cervicalgia: Secondary | ICD-10-CM

## 2019-10-30 MED ORDER — LORAZEPAM 1 MG PO TABS: 1.0000 mg | ORAL_TABLET | Freq: Two times a day (BID) | ORAL | 0 refills | Status: DC | PRN

## 2019-10-30 MED ORDER — HYDROCODONE-ACETAMINOPHEN 7.5-325 MG PO TABS: ORAL_TABLET | ORAL | 0 refills | Status: DC

## 2019-10-30 NOTE — Telephone Encounter (Signed)
Patient requested refill Contract updated Epic LR: 09/29/2019 Pended Rx and sent to Dr. Renato Gails for approval.

## 2019-11-29 ENCOUNTER — Other Ambulatory Visit: Payer: Self-pay | Admitting: *Deleted

## 2019-11-29 DIAGNOSIS — F411 Generalized anxiety disorder: Secondary | ICD-10-CM

## 2019-11-29 DIAGNOSIS — M542 Cervicalgia: Secondary | ICD-10-CM

## 2019-11-29 MED ORDER — HYDROCODONE-ACETAMINOPHEN 7.5-325 MG PO TABS: ORAL_TABLET | ORAL | 0 refills | Status: DC

## 2019-11-29 MED ORDER — LORAZEPAM 1 MG PO TABS: 1.0000 mg | ORAL_TABLET | Freq: Two times a day (BID) | ORAL | 0 refills | Status: DC | PRN

## 2019-11-29 NOTE — Telephone Encounter (Signed)
Patient requested refill Contract on file Epic LR: 10/30/2019 Pended Rx and sent to Dr. Renato Gails for approval.

## 2019-12-28 ENCOUNTER — Other Ambulatory Visit: Payer: Self-pay | Admitting: *Deleted

## 2019-12-28 DIAGNOSIS — M542 Cervicalgia: Secondary | ICD-10-CM

## 2019-12-28 DIAGNOSIS — F411 Generalized anxiety disorder: Secondary | ICD-10-CM

## 2019-12-28 MED ORDER — HYDROCODONE-ACETAMINOPHEN 7.5-325 MG PO TABS: ORAL_TABLET | ORAL | 0 refills | Status: DC

## 2019-12-28 MED ORDER — LORAZEPAM 1 MG PO TABS: 1.0000 mg | ORAL_TABLET | Freq: Two times a day (BID) | ORAL | 0 refills | Status: DC | PRN

## 2019-12-28 NOTE — Telephone Encounter (Signed)
Patient called requesting refill Epic LR: 11/29/2019 Contract on file Pended Rx and sent to Dr. Renato Gails for approval.

## 2020-01-29 ENCOUNTER — Other Ambulatory Visit: Payer: Self-pay | Admitting: *Deleted

## 2020-01-29 DIAGNOSIS — F411 Generalized anxiety disorder: Secondary | ICD-10-CM

## 2020-01-29 DIAGNOSIS — M542 Cervicalgia: Secondary | ICD-10-CM

## 2020-01-29 MED ORDER — HYDROCODONE-ACETAMINOPHEN 7.5-325 MG PO TABS: ORAL_TABLET | ORAL | 0 refills | Status: DC

## 2020-01-29 MED ORDER — LORAZEPAM 1 MG PO TABS: 1.0000 mg | ORAL_TABLET | Freq: Two times a day (BID) | ORAL | 0 refills | Status: DC | PRN

## 2020-01-29 NOTE — Telephone Encounter (Signed)
Looks like he came in dec last year.  I'll approve his meds in the interim

## 2020-01-29 NOTE — Telephone Encounter (Signed)
Patient has an appointment to come in Thursday 02/01/2020 at 9. Stated he will come fasting so labs could be drawn at the same time if needed.   Patient wants to know if you will go ahead and call in his Rx's because he is out.

## 2020-01-29 NOTE — Telephone Encounter (Signed)
Patient requested refills.  Epic LR: 12/28/2019 Contract on [employer] Rx and sent to Dr. Renato Gails for approval.

## 2020-01-29 NOTE — Telephone Encounter (Signed)
Tried calling patient to schedule appointment. Cannot leave Voicemail, will try again later.

## 2020-01-29 NOTE — Telephone Encounter (Signed)
Looks like he needs an appt

## 2020-02-01 ENCOUNTER — Other Ambulatory Visit: Payer: Self-pay

## 2020-02-01 ENCOUNTER — Ambulatory Visit (INDEPENDENT_AMBULATORY_CARE_PROVIDER_SITE_OTHER): Payer: 59 | Admitting: Internal Medicine

## 2020-02-01 ENCOUNTER — Encounter: Payer: Self-pay | Admitting: Internal Medicine

## 2020-02-01 VITALS — BP 120/78 | HR 87 | Temp 98.2°F | Ht 72.0 in | Wt 188.6 lb

## 2020-02-01 DIAGNOSIS — J4520 Mild intermittent asthma, uncomplicated: Secondary | ICD-10-CM

## 2020-02-01 DIAGNOSIS — G4733 Obstructive sleep apnea (adult) (pediatric): Secondary | ICD-10-CM

## 2020-02-01 DIAGNOSIS — T782XXS Anaphylactic shock, unspecified, sequela: Secondary | ICD-10-CM

## 2020-02-01 DIAGNOSIS — Z7189 Other specified counseling: Secondary | ICD-10-CM

## 2020-02-01 DIAGNOSIS — R739 Hyperglycemia, unspecified: Secondary | ICD-10-CM

## 2020-02-01 DIAGNOSIS — Z7252 High risk homosexual behavior: Secondary | ICD-10-CM

## 2020-02-01 DIAGNOSIS — F411 Generalized anxiety disorder: Secondary | ICD-10-CM

## 2020-02-01 DIAGNOSIS — E785 Hyperlipidemia, unspecified: Secondary | ICD-10-CM

## 2020-02-01 DIAGNOSIS — M542 Cervicalgia: Secondary | ICD-10-CM

## 2020-02-01 DIAGNOSIS — R6882 Decreased libido: Secondary | ICD-10-CM

## 2020-02-01 MED ORDER — DULOXETINE HCL 30 MG PO CPEP: 30.0000 mg | ORAL_CAPSULE | Freq: Every day | ORAL | 1 refills | Status: DC

## 2020-02-01 MED ORDER — EPINEPHRINE 0.3 MG/0.3ML IJ SOAJ: INTRAMUSCULAR | 1 refills | Status: DC

## 2020-02-01 MED ORDER — ALBUTEROL SULFATE HFA 108 (90 BASE) MCG/ACT IN AERS: 2.0000 | INHALATION_SPRAY | Freq: Four times a day (QID) | RESPIRATORY_TRACT | 2 refills | Status: DC | PRN

## 2020-02-01 NOTE — Patient Instructions (Signed)
Please bring us a copy of your living will and health care of attorney.   
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
## 2020-02-02 LAB — COMPLETE METABOLIC PANEL WITH GFR
AG Ratio: 1.8 (calc) (ref 1.0–2.5)
ALT: 17 U/L (ref 9–46)
AST: 14 U/L (ref 10–35)
Albumin: 4.1 g/dL (ref 3.6–5.1)
Alkaline phosphatase (APISO): 48 U/L (ref 35–144)
BUN: 21 mg/dL (ref 7–25)
CO2: 24 mmol/L (ref 20–32)
Calcium: 9.3 mg/dL (ref 8.6–10.3)
Chloride: 106 mmol/L (ref 98–110)
Creat: 1.01 mg/dL (ref 0.70–1.25)
GFR, Est African American: 93 mL/min/{1.73_m2} (ref >=60)
GFR, Est Non African American: 80 mL/min/{1.73_m2} (ref >=60)
Globulin: 2.3 g/dL (calc) (ref 1.9–3.7)
Glucose, Bld: 101 mg/dL — ABNORMAL HIGH (ref 65–99)
Potassium: 4.7 mmol/L (ref 3.5–5.3)
Sodium: 137 mmol/L (ref 135–146)
Total Bilirubin: 0.5 mg/dL (ref 0.2–1.2)
Total Protein: 6.4 g/dL (ref 6.1–8.1)

## 2020-02-02 LAB — CBC WITH DIFFERENTIAL/PLATELET
Absolute Monocytes: 539 cells/uL (ref 200–950)
Basophils Absolute: 62 cells/uL (ref 0–200)
Basophils Relative: 1 %
Eosinophils Absolute: 149 cells/uL (ref 15–500)
Eosinophils Relative: 2.4 %
HCT: 39.4 % (ref 38.5–50.0)
Hemoglobin: 13.4 g/dL (ref 13.2–17.1)
Lymphs Abs: 1798 cells/uL (ref 850–3900)
MCH: 28.6 pg (ref 27.0–33.0)
MCHC: 34 g/dL (ref 32.0–36.0)
MCV: 84.2 fL (ref 80.0–100.0)
MPV: 10.4 fL (ref 7.5–12.5)
Monocytes Relative: 8.7 %
Neutro Abs: 3652 cells/uL (ref 1500–7800)
Neutrophils Relative %: 58.9 %
Platelets: 227 10*3/uL (ref 140–400)
RBC: 4.68 10*6/uL (ref 4.20–5.80)
RDW: 13.1 % (ref 11.0–15.0)
Total Lymphocyte: 29 %
WBC: 6.2 10*3/uL (ref 3.8–10.8)

## 2020-02-02 LAB — HIV ANTIBODY (ROUTINE TESTING W REFLEX): HIV 1&2 Ab, 4th Generation: NONREACTIVE

## 2020-02-02 LAB — LIPID PANEL
Cholesterol: 226 mg/dL — ABNORMAL HIGH (ref <200)
HDL: 85 mg/dL (ref >=40)
LDL Cholesterol (Calc): 117 mg/dL (calc) — ABNORMAL HIGH
Non-HDL Cholesterol (Calc): 141 mg/dL (calc) — ABNORMAL HIGH (ref <130)
Total CHOL/HDL Ratio: 2.7 (calc) (ref <5.0)
Triglycerides: 127 mg/dL (ref <150)

## 2020-02-02 LAB — TESTOSTERONE TOTAL,FREE,BIO, MALES
Albumin: 4.1 g/dL (ref 3.6–5.1)
Sex Hormone Binding: 33 nmol/L (ref 22–77)
Testosterone, Bioavailable: 129.2 ng/dL (ref >=110.0)
Testosterone, Free: 68.6 pg/mL (ref 46.0–224.0)
Testosterone: 495 ng/dL (ref 250–827)

## 2020-02-02 LAB — HEMOGLOBIN A1C
Hgb A1c MFr Bld: 5.5 % of total Hgb (ref <5.7)
Mean Plasma Glucose: 111 (calc)
eAG (mmol/L): 6.2 (calc)

## 2020-02-02 NOTE — Progress Notes (Signed)
Blood counts are normal HIV negative Electrolytes, liver, kidneys all ok Cholesterol is elevated but it's considerably better than last year even after his shrimp binge the previous night ;)  goal LDL is less than 100 and he's at 117.  I think he can get this down as he's been getting back to his exercise

## 2020-02-05 ENCOUNTER — Encounter: Payer: Self-pay | Admitting: Internal Medicine

## 2020-02-27 ENCOUNTER — Other Ambulatory Visit: Payer: Self-pay | Admitting: *Deleted

## 2020-02-27 DIAGNOSIS — F411 Generalized anxiety disorder: Secondary | ICD-10-CM

## 2020-02-27 DIAGNOSIS — M542 Cervicalgia: Secondary | ICD-10-CM

## 2020-02-27 MED ORDER — HYDROCODONE-ACETAMINOPHEN 7.5-325 MG PO TABS: ORAL_TABLET | ORAL | 0 refills | Status: DC

## 2020-02-27 MED ORDER — LORAZEPAM 1 MG PO TABS: 1.0000 mg | ORAL_TABLET | Freq: Two times a day (BID) | ORAL | 0 refills | Status: DC | PRN

## 2020-02-27 NOTE — Telephone Encounter (Signed)
Patient requested refills Epic LR: 01/29/2020 Contract on File Pended Rx and sent to Dr. Renato Gails for approval.

## 2020-03-29 ENCOUNTER — Other Ambulatory Visit: Payer: Self-pay | Admitting: *Deleted

## 2020-03-29 DIAGNOSIS — M542 Cervicalgia: Secondary | ICD-10-CM

## 2020-03-29 DIAGNOSIS — F411 Generalized anxiety disorder: Secondary | ICD-10-CM

## 2020-03-29 NOTE — Telephone Encounter (Signed)
Patient requested refill Epic LR: 02/27/2020 Needs Contract updated. Scheduled an appointment.  Pended Rx and sent to Dr. Renato Gails for approval.

## 2020-03-30 MED ORDER — HYDROCODONE-ACETAMINOPHEN 7.5-325 MG PO TABS: ORAL_TABLET | ORAL | 0 refills | Status: DC

## 2020-03-30 MED ORDER — LORAZEPAM 1 MG PO TABS: 1.0000 mg | ORAL_TABLET | Freq: Two times a day (BID) | ORAL | 0 refills | Status: DC | PRN

## 2020-04-02 ENCOUNTER — Telehealth: Payer: Self-pay

## 2020-04-02 NOTE — Telephone Encounter (Signed)
Patient called about concerns he had about his upcoming appointment and the new strand of Covid he want ed to know if he could cancel his appointment and just come in and sign his new Contract I explained to him that he would need to be seen just as well so he agreed to come in and he said he would discuss some of his concerns with Dr. Renato Gails

## 2020-04-18 ENCOUNTER — Ambulatory Visit (INDEPENDENT_AMBULATORY_CARE_PROVIDER_SITE_OTHER): Payer: 59 | Admitting: Internal Medicine

## 2020-04-18 ENCOUNTER — Encounter: Payer: Self-pay | Admitting: Internal Medicine

## 2020-04-18 ENCOUNTER — Other Ambulatory Visit: Payer: Self-pay

## 2020-04-18 VITALS — BP 122/82 | HR 75 | Temp 97.7°F | Ht 73.0 in | Wt 192.9 lb

## 2020-04-18 DIAGNOSIS — F411 Generalized anxiety disorder: Secondary | ICD-10-CM

## 2020-04-18 DIAGNOSIS — M542 Cervicalgia: Secondary | ICD-10-CM | POA: Diagnosis not present

## 2020-04-18 DIAGNOSIS — M5416 Radiculopathy, lumbar region: Secondary | ICD-10-CM

## 2020-04-18 DIAGNOSIS — M48061 Spinal stenosis, lumbar region without neurogenic claudication: Secondary | ICD-10-CM

## 2020-04-18 NOTE — Progress Notes (Signed)
Location:  [facility] clinic Provider: Veronia Laprise L. Renato Gails, D.O., C.M.D.  Goals of Care:  Advanced Directives 04/18/2020  Does Patient Have a Medical Advance Directive? No  Type of Advance Directive -  Does patient want to make changes to medical advance directive? No - Patient declined  Copy of Healthcare Power of Attorney in Chart? -  Would patient like information on creating a medical advance directive? -   Chief Complaint  Patient presents with  . Medical Management of Chronic Issues    2 month follow up on back and leg pain. Discuss back clicking and making noises. Discuss Meloxicam 15 mg it made him dizzy so he cut in in half. Question about long term use of Meloxicam.     HPI: Patient is a 60 y.o. male seen today for an acute visit for back and left leg pain.    Went to see Dr. Earma Reading who though he had spinal stenosis.  He's been on the meloxicam 15mg  for that, but he cut it in half and dizziness got better.   He tried to reduce his norco, but could not tolerate that.  He'd like to try to reduce it again in the future if possible.  He has a clicking.  It's not painful.  It moves around.    He has not started cymbalta due to the vertigo.  Didn't want to introduce something new, but he may now try it.    He is no longer in the relationship for which he took and they stopped shipping it to him almost immediately.  He believes he only took it for a year and a half.    Past Medical History:  Diagnosis Date  . Anxiety   . Asthma   . BPH (benign prostatic hyperplasia)   . Cervicalgia    degenerative changes and foraminal stenoses  . Chest pain, unspecified   . Depression   . Family history of colonic polyps    Father with benign colon polyp  . Headache, tension type, chronic 01/2012  . Hepatitis B antibody positive 1984  . Hiatal hernia 09/19/10  . Hyperglycemia    104 on 10/10/10  . Hyperlipidemia   . Obstructive sleep apnea (adult) (pediatric)    using CPAP  . Other  malaise and fatigue   . Supraventricular premature beats    noted on EKG 05/18/12  . Wrist pain, left     Past Surgical History:  Procedure Laterality Date  . COLON SURGERY  09/17/2008   poylps/hemorriods  . WISDOM TOOTH EXTRACTION  03/2009    Allergies  Allergen Reactions  . Bee Venom Anaphylaxis  . Codeine Other (See Comments)  . Pyridoxine Other (See Comments)    Made him crazy  . Skelaxin [Metaxalone] Other (See Comments)  . Augmentin [Amoxicillin-Pot Clavulanate] Nausea And Vomiting  . Sedapap [Butalbital-Acetaminophen] Other (See Comments)    Outpatient Encounter Medications as of 04/18/2020  Medication Sig  . albuterol (VENTOLIN HFA) 108 (90 Base) MCG/ACT inhaler Inhale 2 puffs into the lungs every 6 (six) hours as needed for wheezing or shortness of breath.  04/20/2020 aspirin EC 81 MG tablet Take 81 mg by mouth every other day.  . cholecalciferol (VITAMIN D3) 25 MCG (1000 UT) tablet Take 1,000 Units by mouth daily.  . DULoxetine (CYMBALTA) 30 MG capsule Take 1 capsule (30 mg total) by mouth daily.  Marland Kitchen EPINEPHrine 0.3 mg/0.3 mL IJ SOAJ injection Inject 0.83ml into the muscle as needed for allergic reaction  . HYDROcodone-acetaminophen (  NORCO) 7.5-325 MG tablet Take one tablet every 4 hours as needed for pain  . LORazepam (ATIVAN) 1 MG tablet Take 1 tablet (1 mg total) by mouth 2 (two) times daily as needed. for anxiety  . montelukast (SINGULAIR) 10 MG tablet TAKE 1 TABLET(10 MG) BY MOUTH AT BEDTIME  . Multiple Vitamin (MULTIVITAMIN) tablet Take 1 tablet by mouth daily.  . sildenafil (REVATIO) 20 MG tablet Take 2-5 tablets prior to intercourse  . zinc gluconate 50 MG tablet Take 50 mg by mouth daily.   No facility-administered encounter medications on file as of 04/18/2020.    Review of Systems:  Review of Systems  Constitutional: Negative for chills, fever and malaise/fatigue.  HENT: Negative for hearing loss.   Eyes: Negative for blurred vision.  Respiratory: Negative for cough  and shortness of breath.   Cardiovascular: Negative for chest pain, palpitations and leg swelling.  Gastrointestinal: Negative for abdominal pain, blood in stool, constipation, diarrhea and melena.       No incontinence  Genitourinary: Negative for dysuria.       No incontinence or retention  Musculoskeletal: Positive for back pain, joint pain, myalgias and neck pain. Negative for falls.  Skin: Negative for itching and rash.  Neurological: Positive for dizziness, tingling and sensory change. Negative for focal weakness, loss of consciousness and weakness.       Left groin discomfort comes and goes   Endo/Heme/Allergies: Does not bruise/bleed easily.  Psychiatric/Behavioral: Negative for depression and memory loss. The patient is nervous/anxious. The patient does not have insomnia.     Health Maintenance  Topic Date Due  . COLONOSCOPY (Pts 45-24yrs Insurance coverage will need to be confirmed)  01/30/2021  . TETANUS/TDAP  12/11/2025  . INFLUENZA VACCINE  Completed  . COVID-19 Vaccine  Completed  . Hepatitis C Screening  Completed  . HIV Screening  Completed    Physical Exam: Vitals:   04/18/20 1031  BP: 122/82  Pulse: 75  Temp: 97.7 F (36.5 C)  SpO2: 98%  Weight: 192 lb 14.4 oz (87.5 kg)  Height: 6\' 1"  (1.854 m)   Body mass index is 25.45 kg/m. Physical Exam Vitals reviewed.  Constitutional:      Appearance: Normal appearance.  HENT:     Head: Normocephalic and atraumatic.  Eyes:     Conjunctiva/sclera: Conjunctivae normal.     Pupils: Pupils are equal, round, and reactive to light.  Cardiovascular:     Rate and Rhythm: Normal rate and regular rhythm.     Pulses: Normal pulses.     Heart sounds: Normal heart sounds.  Pulmonary:     Effort: Pulmonary effort is normal.     Breath sounds: Normal breath sounds. No wheezing, rhonchi or rales.  Abdominal:     General: Bowel sounds are normal. There is no distension.     Tenderness: There is no abdominal tenderness.  There is no guarding or rebound.  Musculoskeletal:        General: Normal range of motion.     Right lower leg: No edema.     Left lower leg: No edema.     Comments: Some discomfort left SI region and into left lateral hip and groin, notes clicking with positional changes at times (did hear this on exam)  Skin:    General: Skin is warm and dry.  Neurological:     General: No focal deficit present.     Mental Status: He is alert and oriented to person, place, and time. Mental  status is at baseline.     Sensory: No sensory deficit.     Motor: No weakness.     Coordination: Coordination normal.     Gait: Gait normal.     Deep Tendon Reflexes: Reflexes normal.  Psychiatric:        Mood and Affect: Mood normal.        Behavior: Behavior normal.        Thought Content: Thought content normal.        Judgment: Judgment normal.     Comments: Is anxious and jittery     Labs reviewed: Basic Metabolic Panel: Recent Labs    02/01/20 1028  NA 137  K 4.7  CL 106  CO2 24  GLUCOSE 101*  BUN 21  CREATININE 1.01  CALCIUM 9.3   Liver Function Tests: Recent Labs    02/01/20 1028  AST 14  ALT 17  BILITOT 0.5  PROT 6.4   No results for input(s): LIPASE, AMYLASE in the last 8760 hours. No results for input(s): AMMONIA in the last 8760 hours. CBC: Recent Labs    02/01/20 1028  WBC 6.2  NEUTROABS 3,652  HGB 13.4  HCT 39.4  MCV 84.2  PLT 227   Lipid Panel: Recent Labs    02/01/20 1028  CHOL 226*  HDL 85  LDLCALC 117*  TRIG 127  CHOLHDL 2.7   Lab Results  Component Value Date   HGBA1C 5.5 02/01/2020    Procedures since last visit: No results found.  Assessment/Plan 1. Spinal stenosis of lumbar region with radiculopathy -cont low dose meloxicam with food -try the cymbalta for this and to address anxiety -remain active -heat, ice, topicals also ok to use, may consider less traditional therapeutic modalities if desires   2. Anxiety state -on long-term  ativan -add the cymbalta recommended several months ago and encouraged by me last visit to help with both pain and anxiety, prior trauma  3. Cervicalgia -continues on chronic long-term opioid therapy he came to me on  -has contract and is always adherent with it -he does hope to wean from the norco when his acute back issue resolves  Labs/tests ordered:   Lab Orders  No laboratory test(s) ordered today   Next appt:  6 mos med mgt  [place] L. Britney Newstrom, D.O. Geriatrics [facility] [address], [place] [address] Cell Phone (Mon-Fri 8am-5pm):  [phone] On Call:  [phone] & follow prompts after 5pm & weekends Office Phone:  [phone] Office Fax:  [phone]

## 2020-04-30 ENCOUNTER — Other Ambulatory Visit: Payer: Self-pay | Admitting: *Deleted

## 2020-04-30 DIAGNOSIS — F411 Generalized anxiety disorder: Secondary | ICD-10-CM

## 2020-04-30 DIAGNOSIS — M542 Cervicalgia: Secondary | ICD-10-CM

## 2020-04-30 MED ORDER — LORAZEPAM 1 MG PO TABS
1.0000 mg | ORAL_TABLET | Freq: Two times a day (BID) | ORAL | 0 refills | Status: DC | PRN
Start: 2020-04-30 — End: 2020-05-28

## 2020-04-30 MED ORDER — HYDROCODONE-ACETAMINOPHEN 7.5-325 MG PO TABS
ORAL_TABLET | ORAL | 0 refills | Status: DC
Start: 2020-04-30 — End: 2020-05-28

## 2020-04-30 NOTE — Telephone Encounter (Signed)
Patient requested refill Contract on File Epic LR: 03/30/2020 Pended Rx and sent to Dr. Renato Gails for approval.

## 2020-05-13 ENCOUNTER — Encounter: Payer: Self-pay | Admitting: Internal Medicine

## 2020-05-24 ENCOUNTER — Encounter: Payer: Self-pay | Admitting: Internal Medicine

## 2020-05-28 ENCOUNTER — Other Ambulatory Visit: Payer: Self-pay | Admitting: *Deleted

## 2020-05-28 DIAGNOSIS — M542 Cervicalgia: Secondary | ICD-10-CM

## 2020-05-28 DIAGNOSIS — F411 Generalized anxiety disorder: Secondary | ICD-10-CM

## 2020-05-28 MED ORDER — LORAZEPAM 1 MG PO TABS: 1.0000 mg | ORAL_TABLET | Freq: Two times a day (BID) | ORAL | 0 refills | Status: DC | PRN

## 2020-05-28 MED ORDER — HYDROCODONE-ACETAMINOPHEN 7.5-325 MG PO TABS: ORAL_TABLET | ORAL | 0 refills | Status: DC

## 2020-05-28 NOTE — Telephone Encounter (Signed)
Patient requested refill Epic LR: 04/30/2020 Contract on File Pended Rx and sent to Dr Renato Gails for approval.

## 2020-06-28 ENCOUNTER — Other Ambulatory Visit: Payer: Self-pay | Admitting: *Deleted

## 2020-06-28 DIAGNOSIS — M542 Cervicalgia: Secondary | ICD-10-CM

## 2020-06-28 DIAGNOSIS — F411 Generalized anxiety disorder: Secondary | ICD-10-CM

## 2020-06-28 MED ORDER — HYDROCODONE-ACETAMINOPHEN 7.5-325 MG PO TABS: ORAL_TABLET | ORAL | 0 refills | Status: DC

## 2020-06-28 MED ORDER — LORAZEPAM 1 MG PO TABS: 1.0000 mg | ORAL_TABLET | Freq: Two times a day (BID) | ORAL | 0 refills | Status: DC | PRN

## 2020-06-28 NOTE — Telephone Encounter (Signed)
Patient requested refill  Epic LR: 05/28/2020 Contract on File Pended Rx's and sent to [place] for approval.

## 2020-07-29 ENCOUNTER — Other Ambulatory Visit: Payer: Self-pay | Admitting: *Deleted

## 2020-07-29 DIAGNOSIS — M542 Cervicalgia: Secondary | ICD-10-CM

## 2020-07-29 DIAGNOSIS — F411 Generalized anxiety disorder: Secondary | ICD-10-CM

## 2020-07-29 MED ORDER — HYDROCODONE-ACETAMINOPHEN 7.5-325 MG PO TABS: ORAL_TABLET | ORAL | 0 refills | Status: DC

## 2020-07-29 MED ORDER — LORAZEPAM 1 MG PO TABS: 1.0000 mg | ORAL_TABLET | Freq: Two times a day (BID) | ORAL | 0 refills | Status: DC | PRN

## 2020-07-29 NOTE — Telephone Encounter (Signed)
Patient requested refill Last Epic RF 06/28/2020 Contract on [employer] Rx and sent to [place] for approval.

## 2020-08-28 ENCOUNTER — Other Ambulatory Visit: Payer: Self-pay | Admitting: *Deleted

## 2020-08-28 DIAGNOSIS — M542 Cervicalgia: Secondary | ICD-10-CM

## 2020-08-28 DIAGNOSIS — F411 Generalized anxiety disorder: Secondary | ICD-10-CM

## 2020-08-28 NOTE — Telephone Encounter (Signed)
Patient requested refill Epic LR: 07/29/2020 Contract on [employer] Rx and sent to [place] for approval.

## 2020-08-29 MED ORDER — LORAZEPAM 1 MG PO TABS: 1.0000 mg | ORAL_TABLET | Freq: Two times a day (BID) | ORAL | 0 refills | Status: DC | PRN

## 2020-08-29 MED ORDER — HYDROCODONE-ACETAMINOPHEN 7.5-325 MG PO TABS: ORAL_TABLET | ORAL | 0 refills | Status: DC

## 2020-09-27 ENCOUNTER — Other Ambulatory Visit: Payer: Self-pay | Admitting: *Deleted

## 2020-09-27 DIAGNOSIS — M542 Cervicalgia: Secondary | ICD-10-CM

## 2020-09-27 DIAGNOSIS — F411 Generalized anxiety disorder: Secondary | ICD-10-CM

## 2020-09-27 MED ORDER — HYDROCODONE-ACETAMINOPHEN 7.5-325 MG PO TABS: ORAL_TABLET | ORAL | 0 refills | Status: DC

## 2020-09-27 MED ORDER — LORAZEPAM 1 MG PO TABS: 1.0000 mg | ORAL_TABLET | Freq: Two times a day (BID) | ORAL | 0 refills | Status: DC | PRN

## 2020-09-27 NOTE — Telephone Encounter (Signed)
Patient requested refill.  Epic LR: 08/29/2020 Contract on [employer] and sent to [place] for approval.

## 2020-10-24 ENCOUNTER — Ambulatory Visit: Payer: 59 | Admitting: Internal Medicine

## 2020-10-25 ENCOUNTER — Other Ambulatory Visit: Payer: Self-pay

## 2020-10-25 ENCOUNTER — Ambulatory Visit (INDEPENDENT_AMBULATORY_CARE_PROVIDER_SITE_OTHER): Payer: 59 | Admitting: Nurse Practitioner

## 2020-10-25 ENCOUNTER — Encounter: Payer: Self-pay | Admitting: Nurse Practitioner

## 2020-10-25 VITALS — BP 118/76 | HR 62 | Temp 97.8°F | Ht 73.0 in | Wt 186.0 lb

## 2020-10-25 DIAGNOSIS — R739 Hyperglycemia, unspecified: Secondary | ICD-10-CM

## 2020-10-25 DIAGNOSIS — F411 Generalized anxiety disorder: Secondary | ICD-10-CM

## 2020-10-25 DIAGNOSIS — E785 Hyperlipidemia, unspecified: Secondary | ICD-10-CM

## 2020-10-25 DIAGNOSIS — M48061 Spinal stenosis, lumbar region without neurogenic claudication: Secondary | ICD-10-CM | POA: Diagnosis not present

## 2020-10-25 DIAGNOSIS — R3912 Poor urinary stream: Secondary | ICD-10-CM | POA: Diagnosis not present

## 2020-10-25 DIAGNOSIS — M5416 Radiculopathy, lumbar region: Secondary | ICD-10-CM

## 2020-10-25 DIAGNOSIS — Z79899 Other long term (current) drug therapy: Secondary | ICD-10-CM

## 2020-10-25 DIAGNOSIS — F419 Anxiety disorder, unspecified: Secondary | ICD-10-CM

## 2020-10-25 DIAGNOSIS — G4733 Obstructive sleep apnea (adult) (pediatric): Secondary | ICD-10-CM

## 2020-10-25 MED ORDER — DULOXETINE HCL 20 MG PO CPEP: 20.0000 mg | ORAL_CAPSULE | Freq: Every day | ORAL | 1 refills | Status: DC

## 2020-10-25 MED ORDER — LORAZEPAM 0.5 MG PO TABS: 0.5000 mg | ORAL_TABLET | Freq: Two times a day (BID) | ORAL | Status: DC | PRN

## 2020-10-25 NOTE — Progress Notes (Signed)
Careteam: Patient Care Team: [name], NP as PCP - General (Geriatric Medicine)  PLACE OF SERVICE:  [facility]  Advanced Directive information Does Patient Have a Medical Advance Directive?: Yes, Type of Advance Directive: Healthcare Power of [place];Living will, Does patient want to make changes to medical advance directive?: No - Patient declined  Allergies  Allergen Reactions   Bee Venom Anaphylaxis   Codeine Other (See Comments)   Pyridoxine Other (See Comments)    Made him crazy   Skelaxin [Metaxalone] Other (See Comments)   Augmentin [Amoxicillin-Pot Clavulanate] Nausea And Vomiting   Sedapap [Butalbital-Acetaminophen] Other (See Comments)    Chief Complaint  Patient presents with   Medical Management of Chronic Issues    6 month follow-up and fasting labs. Patient is requesting blood sugar check and PSA (c/o trouble urinating x couple weeks). Patient thinks he took something in the past and is not sure why it was discontinued. Discuss mobic.      HPI: Patient is a 60 y.o. male for routine follow up.   Reports urine slow to start over the last weeks noticing more. He was on medication in the past, unsure why he stopped medication.  Getting up 3 times at night to urinate. Drinks a lot of water.   Got awful vertigo for 6 months. He was prescribed cymbalta but did not want to start. He was started on another antidepressant that made him feel bad.   Took lorazepam to "ease the day" in the evening. A lot of the time will skip a dose.   He has significant back and neck pain. Has tried talk therapy, chiropractor, massage. Has tired several things to help the pain.  He will wake up with severe pain.  Reports hip has improved.  He is taking meloxicam 15 mg and taking 1/2 tablet daily.  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  HENT:  Negative for tinnitus.   Respiratory:  Negative for cough, sputum production and shortness of breath.   Cardiovascular:  Negative for chest pain, palpitations and leg swelling.  Gastrointestinal:  Negative for abdominal pain, constipation, diarrhea and heartburn.  Genitourinary:  Negative for dysuria, frequency and urgency.  Musculoskeletal:  Positive for back pain, joint pain, myalgias and neck pain. Negative for falls.  Skin: Negative.   Neurological:  Negative for dizziness and headaches.  Psychiatric/Behavioral:  Negative for depression and memory loss. The patient is nervous/anxious. The patient does not have insomnia.    Past Medical History:  Diagnosis Date   Anxiety    Asthma    BPH (benign prostatic hyperplasia)    Cervicalgia    degenerative changes and foraminal stenoses   Chest pain, unspecified    Depression    Family history of colonic polyps    Father with benign colon polyp   Headache, tension type, chronic 01/2012   Hepatitis B antibody positive 1984   Hiatal hernia 09/19/10   Hyperglycemia    104 on 10/10/10   Hyperlipidemia    Obstructive sleep apnea (adult) (pediatric)    using CPAP   Other malaise and fatigue    Supraventricular premature beats    noted on EKG 05/18/12   Wrist pain, left    Past Surgical History:  Procedure Laterality Date   COLON SURGERY  09/17/2008   poylps/hemorriods  WISDOM TOOTH EXTRACTION  03/2009   Social History:   reports that he has quit smoking. His smoking use included cigarettes. He has never used smokeless tobacco. He reports previous alcohol use. He reports that he does not use drugs.  Family History  Problem Relation Age of Onset   Heart disease Mother    Hypertension Mother    Diabetes Mother    Depression Mother    Cancer Father        2004 bladder cancer   Alcohol abuse Father     Medications: Patient's Medications  New Prescriptions   No medications on file  Previous Medications   ALBUTEROL  (VENTOLIN HFA) 108 (90 BASE) MCG/ACT INHALER    Inhale 2 puffs into the lungs every 6 (six) hours as needed for wheezing or shortness of breath.   ASPIRIN EC 81 MG TABLET    Take 81 mg by mouth every other day.   CHOLECALCIFEROL (VITAMIN D3) 25 MCG (1000 UT) TABLET    Take 1,000 Units by mouth daily.   EPINEPHRINE 0.3 MG/0.3 ML IJ SOAJ INJECTION    Inject 0.65ml into the muscle as needed for allergic reaction   HYDROCODONE-ACETAMINOPHEN (NORCO) 7.5-325 MG TABLET    Take one tablet every 4 hours as needed for pain   LORAZEPAM (ATIVAN) 1 MG TABLET    Take 1 tablet (1 mg total) by mouth 2 (two) times daily as needed. for anxiety   MELOXICAM (MOBIC) 15 MG TABLET    Take 7.5 mg by mouth as needed for pain.   MONTELUKAST (SINGULAIR) 10 MG TABLET    Take 10 mg by mouth as needed.   MULTIPLE VITAMIN (MULTIVITAMIN) TABLET    Take 1 tablet by mouth daily.   SILDENAFIL (REVATIO) 20 MG TABLET    Take 2-5 tablets prior to intercourse   ZINC GLUCONATE 50 MG TABLET    Take 50 mg by mouth daily.  Modified Medications   No medications on file  Discontinued Medications   DULOXETINE (CYMBALTA) 30 MG CAPSULE    Take 1 capsule (30 mg total) by mouth daily.   MONTELUKAST (SINGULAIR) 10 MG TABLET    TAKE 1 TABLET(10 MG) BY MOUTH AT BEDTIME    Physical Exam:  Vitals:   10/25/20 1006  BP: 118/76  Pulse: 62  Temp: 97.8 F (36.6 C)  TempSrc: Temporal  SpO2: 98%  Weight: 186 lb (84.4 kg)  Height: 6\' 1"  (1.854 m)   Body mass index is 24.54 kg/m. Wt Readings from Last 3 Encounters:  10/25/20 186 lb (84.4 kg)  04/18/20 192 lb 14.4 oz (87.5 kg)  02/01/20 188 lb 9.6 oz (85.5 kg)    Physical Exam Constitutional:      General: He is not in acute distress.    Appearance: He is well-developed. He is not diaphoretic.  HENT:     Head: Normocephalic and atraumatic.     Right Ear: External ear normal.     Left Ear: External ear normal.     Mouth/Throat:     Pharynx: No oropharyngeal exudate.  Eyes:      Conjunctiva/sclera: Conjunctivae normal.     Pupils: Pupils are equal, round, and reactive to light.  Cardiovascular:     Rate and Rhythm: Normal rate and regular rhythm.     Heart sounds: Normal heart sounds.  Pulmonary:     Effort: Pulmonary effort is normal.     Breath sounds: Normal breath sounds.  Abdominal:     General: Bowel sounds  are normal.     Palpations: Abdomen is soft.  Musculoskeletal:        General: No tenderness.     Cervical back: Normal range of motion and neck supple.     Right lower leg: No edema.     Left lower leg: No edema.  Skin:    General: Skin is warm and dry.  Neurological:     Mental Status: He is alert and oriented to person, place, and time.    Labs reviewed: Basic Metabolic Panel: Recent Labs    02/01/20 1028  NA 137  K 4.7  CL 106  CO2 24  GLUCOSE 101*  BUN 21  CREATININE 1.01  CALCIUM 9.3   Liver Function Tests: Recent Labs    02/01/20 1028  AST 14  ALT 17  BILITOT 0.5  PROT 6.4   No results for input(s): LIPASE, AMYLASE in the last 8760 hours. No results for input(s): AMMONIA in the last 8760 hours. CBC: Recent Labs    02/01/20 1028  WBC 6.2  NEUTROABS 3,652  HGB 13.4  HCT 39.4  MCV 84.2  PLT 227   Lipid Panel: Recent Labs    02/01/20 1028  CHOL 226*  HDL 85  LDLCALC 117*  TRIG 127  CHOLHDL 2.7   TSH: No results for input(s): TSH in the last 8760 hours. A1C: Lab Results  Component Value Date   HGBA1C 5.5 02/01/2020     Assessment/Plan 1. Weak urinary stream - PSA, ?if he was on medication in the past but stopped due to side effects ?vertigo.   2. Spinal stenosis of lumbar region with radiculopathy Chronic pain, currently on hydrocodone-apap 7.5/325 mg every 4 hours for pain.  -discussed the risk of adverse drug reaction (risk for respiratory depression and death)  with taking both hydrocodone and ativan. Ideally would like to have him off ativan if he is going to be taking hydrocodone. He has been  educated on this.  - COMPLETE METABOLIC PANEL WITH GFR - CBC with Differential/Platelet - Urine Drug Screen w/Alc, no confirm([facility])  3. Anxiety state Agreeable to start cymbalta at this time and try to reduce lorazepam use. Will have decrease lorazepam to 0.5 mg (half tablet) twice daily as needed.  - DULoxetine (CYMBALTA) 20 MG capsule; Take 1 capsule (20 mg total) by mouth daily.  Dispense: 30 capsule; Refill: 1 - LORazepam (ATIVAN) 0.5 MG tablet; Take 1 tablet (0.5 mg total) by mouth 2 (two) times daily as needed. for anxiety  4. Hyperlipidemia, unspecified hyperlipidemia type -dietary modifications.  - COMPLETE METABOLIC PANEL WITH GFR - Lipid panel  5. Hyperglycemia -dietary modifications encouraged as well as increase in physical activity.  6. Obstructive sleep apnea (adult) (pediatric) Continues on CPAP .  7. High risk medication use - Urine Drug Screen w/Alc, no confirm([facility])   Next appt: 6 weeks on medication management/cymbalta/ativan Shawntrice Salle K. Biagio Borg  [facility] [phone]

## 2020-10-25 NOTE — Patient Instructions (Signed)
Decrease ativan to 1/2 tablet as needed  Start cymbalta 20 mg by mouth daily for pain and anxiety  Continue lifestyle modifications

## 2020-10-26 ENCOUNTER — Encounter: Payer: Self-pay | Admitting: Nurse Practitioner

## 2020-10-29 ENCOUNTER — Other Ambulatory Visit: Payer: Self-pay | Admitting: *Deleted

## 2020-10-29 DIAGNOSIS — M542 Cervicalgia: Secondary | ICD-10-CM

## 2020-10-29 DIAGNOSIS — F411 Generalized anxiety disorder: Secondary | ICD-10-CM

## 2020-10-29 LAB — DRUG MONITOR, PANEL 1, W/CONF, URINE
Amphetamines: NEGATIVE ng/mL (ref <500)
Barbiturates: NEGATIVE ng/mL (ref <300)
Benzodiazepines: NEGATIVE ng/mL (ref <100)
Cocaine Metabolite: NEGATIVE ng/mL (ref <150)
Codeine: NEGATIVE ng/mL (ref <50)
Creatinine: 18.3 mg/dL — ABNORMAL LOW (ref >=20.0)
Hydrocodone: 203 ng/mL — ABNORMAL HIGH (ref <50)
Hydromorphone: 167 ng/mL — ABNORMAL HIGH (ref <50)
Marijuana Metabolite: NEGATIVE ng/mL (ref <20)
Methadone Metabolite: NEGATIVE ng/mL (ref <100)
Morphine: NEGATIVE ng/mL (ref <50)
Norhydrocodone: 350 ng/mL — ABNORMAL HIGH (ref <50)
Opiates: POSITIVE ng/mL — AB (ref <100)
Oxidant: NEGATIVE ug/mL (ref <200)
Oxycodone: NEGATIVE ng/mL (ref <100)
Phencyclidine: NEGATIVE ng/mL (ref <25)
Specific Gravity: 1.002 — ABNORMAL LOW (ref >=1.003)
pH: 6.9 (ref 4.5–9.0)

## 2020-10-29 LAB — COMPLETE METABOLIC PANEL WITH GFR
AG Ratio: 1.8 (calc) (ref 1.0–2.5)
ALT: 14 U/L (ref 9–46)
AST: 15 U/L (ref 10–35)
Albumin: 4.1 g/dL (ref 3.6–5.1)
Alkaline phosphatase (APISO): 46 U/L (ref 35–144)
BUN: 15 mg/dL (ref 7–25)
CO2: 25 mmol/L (ref 20–32)
Calcium: 9.2 mg/dL (ref 8.6–10.3)
Chloride: 107 mmol/L (ref 98–110)
Creat: 0.85 mg/dL (ref 0.70–1.35)
Globulin: 2.3 g/dL (calc) (ref 1.9–3.7)
Glucose, Bld: 94 mg/dL (ref 65–99)
Potassium: 4.5 mmol/L (ref 3.5–5.3)
Sodium: 139 mmol/L (ref 135–146)
Total Bilirubin: 0.5 mg/dL (ref 0.2–1.2)
Total Protein: 6.4 g/dL (ref 6.1–8.1)
eGFR: 98 mL/min/{1.73_m2} (ref >=60)

## 2020-10-29 LAB — LIPID PANEL
Cholesterol: 219 mg/dL — ABNORMAL HIGH (ref <200)
HDL: 81 mg/dL (ref >=40)
LDL Cholesterol (Calc): 121 mg/dL (calc) — ABNORMAL HIGH
Non-HDL Cholesterol (Calc): 138 mg/dL (calc) — ABNORMAL HIGH (ref <130)
Total CHOL/HDL Ratio: 2.7 (calc) (ref <5.0)
Triglycerides: 73 mg/dL (ref <150)

## 2020-10-29 LAB — DM TEMPLATE

## 2020-10-29 LAB — CBC WITH DIFFERENTIAL/PLATELET
Absolute Monocytes: 422 cells/uL (ref 200–950)
Basophils Absolute: 48 cells/uL (ref 0–200)
Basophils Relative: 1 %
Eosinophils Absolute: 182 cells/uL (ref 15–500)
Eosinophils Relative: 3.8 %
HCT: 41.1 % (ref 38.5–50.0)
Hemoglobin: 13.3 g/dL (ref 13.2–17.1)
Lymphs Abs: 1310 cells/uL (ref 850–3900)
MCH: 28 pg (ref 27.0–33.0)
MCHC: 32.4 g/dL (ref 32.0–36.0)
MCV: 86.5 fL (ref 80.0–100.0)
MPV: 11 fL (ref 7.5–12.5)
Monocytes Relative: 8.8 %
Neutro Abs: 2837 cells/uL (ref 1500–7800)
Neutrophils Relative %: 59.1 %
Platelets: 203 10*3/uL (ref 140–400)
RBC: 4.75 10*6/uL (ref 4.20–5.80)
RDW: 13.4 % (ref 11.0–15.0)
Total Lymphocyte: 27.3 %
WBC: 4.8 10*3/uL (ref 3.8–10.8)

## 2020-10-29 LAB — PSA: PSA: 0.86 ng/mL (ref <=4.00)

## 2020-10-29 MED ORDER — HYDROCODONE-ACETAMINOPHEN 7.5-325 MG PO TABS: ORAL_TABLET | ORAL | 0 refills | Status: DC

## 2020-10-29 MED ORDER — LORAZEPAM 0.5 MG PO TABS: 0.5000 mg | ORAL_TABLET | Freq: Two times a day (BID) | ORAL | 0 refills | Status: DC | PRN

## 2020-10-29 NOTE — Telephone Encounter (Signed)
Patient requested refill.  Epic LR: 09/27/2020 Contract on [facility] Rx and sent to [place] for approval.

## 2020-11-21 ENCOUNTER — Other Ambulatory Visit: Payer: Self-pay | Admitting: Nurse Practitioner

## 2020-11-21 DIAGNOSIS — F419 Anxiety disorder, unspecified: Secondary | ICD-10-CM

## 2020-11-28 ENCOUNTER — Other Ambulatory Visit: Payer: Self-pay

## 2020-11-28 DIAGNOSIS — F419 Anxiety disorder, unspecified: Secondary | ICD-10-CM

## 2020-11-28 DIAGNOSIS — F411 Generalized anxiety disorder: Secondary | ICD-10-CM

## 2020-11-28 DIAGNOSIS — M542 Cervicalgia: Secondary | ICD-10-CM

## 2020-11-28 MED ORDER — HYDROCODONE-ACETAMINOPHEN 7.5-325 MG PO TABS: 1.0000 | ORAL_TABLET | ORAL | 0 refills | Status: DC | PRN

## 2020-11-28 MED ORDER — LORAZEPAM 0.5 MG PO TABS: 0.5000 mg | ORAL_TABLET | Freq: Two times a day (BID) | ORAL | 0 refills | Status: DC | PRN

## 2020-11-28 MED ORDER — DULOXETINE HCL 20 MG PO CPEP
20.0000 mg | ORAL_CAPSULE | Freq: Every day | ORAL | 0 refills | Status: DC
Start: 2020-11-28 — End: 2020-12-20

## 2020-11-28 NOTE — Telephone Encounter (Signed)
RX's last refilled on 10/29/2020, treatment agreement on file from 10/25/2020

## 2020-12-20 ENCOUNTER — Other Ambulatory Visit: Payer: Self-pay | Admitting: *Deleted

## 2020-12-20 DIAGNOSIS — F419 Anxiety disorder, unspecified: Secondary | ICD-10-CM

## 2020-12-20 MED ORDER — DULOXETINE HCL 20 MG PO CPEP: 20.0000 mg | ORAL_CAPSULE | Freq: Every day | ORAL | 0 refills | Status: DC

## 2020-12-20 NOTE — Telephone Encounter (Signed)
Patient is stable and doing well on medication. Does not have enough till appointment on Monday. Sent refill.

## 2020-12-23 ENCOUNTER — Ambulatory Visit (INDEPENDENT_AMBULATORY_CARE_PROVIDER_SITE_OTHER): Payer: 59 | Admitting: Nurse Practitioner

## 2020-12-23 ENCOUNTER — Other Ambulatory Visit: Payer: Self-pay

## 2020-12-23 ENCOUNTER — Encounter: Payer: Self-pay | Admitting: Nurse Practitioner

## 2020-12-23 VITALS — BP 122/74 | HR 81 | Temp 97.1°F | Ht 73.0 in | Wt 183.0 lb

## 2020-12-23 DIAGNOSIS — M5416 Radiculopathy, lumbar region: Secondary | ICD-10-CM | POA: Diagnosis not present

## 2020-12-23 DIAGNOSIS — M48061 Spinal stenosis, lumbar region without neurogenic claudication: Secondary | ICD-10-CM | POA: Diagnosis not present

## 2020-12-23 DIAGNOSIS — F411 Generalized anxiety disorder: Secondary | ICD-10-CM

## 2020-12-23 DIAGNOSIS — T782XXS Anaphylactic shock, unspecified, sequela: Secondary | ICD-10-CM

## 2020-12-23 MED ORDER — DULOXETINE HCL 20 MG PO CPEP: 40.0000 mg | ORAL_CAPSULE | Freq: Every day | ORAL | 1 refills | Status: DC

## 2020-12-23 MED ORDER — LORAZEPAM 0.5 MG PO TABS: 0.5000 mg | ORAL_TABLET | Freq: Every day | ORAL | 0 refills | Status: DC

## 2020-12-23 MED ORDER — EPINEPHRINE 0.3 MG/0.3ML IJ SOAJ
INTRAMUSCULAR | 1 refills | Status: AC
Start: 2020-12-23 — End: ?

## 2020-12-23 NOTE — Progress Notes (Signed)
Careteam: Patient Care Team: [name], NP as PCP - General (Geriatric Medicine)  PLACE OF SERVICE:  [facility]  Advanced Directive information    Allergies  Allergen Reactions   Bee Venom Anaphylaxis   Codeine Other (See Comments)   Pyridoxine Other (See Comments)    Made him crazy   Skelaxin [Metaxalone] Other (See Comments)   Augmentin [Amoxicillin-Pot Clavulanate] Nausea And Vomiting   Sedapap [Butalbital-Acetaminophen] Other (See Comments)    Chief Complaint  Patient presents with   Follow-up    6 week follow-up on anxiety and pain. Discuss need for flu vaccine and covid # 5 or exclude if patient refuses.      HPI: Patient is a 60 y.o. male for follow up on anxiety and pain.  He started Cymbalta 20 mg daily and tolerating well. Has not noticed a difference which is good because no side effects he has also been able to decrease lorazepam he was taking 2 mg in the evening and now has decreased down to 0.5 in the evening and then will take another if needed but most of the time is able to get by with 0.5 mg at bedtime.   Reports he has cpap. Overall sleeping well but sometimes will have trouble with sleeping and will go to another lorazepam to help him sleep.  He has been on the lorazepam and hydrocodone-apap for over 30 years together.   He continues with underlying anxiety.   He quit cigarettes after being on them for YEARS.   Having pain leg pain since last august.going though PT at this time to help.  Review of Systems:  Review of Systems  Constitutional:  Negative for chills and fever.  Musculoskeletal:  Positive for back pain, joint pain and myalgias.  Psychiatric/Behavioral:  The patient is nervous/anxious.    Past Medical History:  Diagnosis Date   Anxiety    Asthma    BPH (benign prostatic hyperplasia)    Cervicalgia    degenerative changes and foraminal stenoses   Chest pain, unspecified    Depression    Family history of colonic polyps     Father with benign colon polyp   Headache, tension type, chronic 01/2012   Hepatitis B antibody positive 1984   Hiatal hernia 09/19/10   Hyperglycemia    104 on 10/10/10   Hyperlipidemia    Obstructive sleep apnea (adult) (pediatric)    using CPAP   Other malaise and fatigue    Supraventricular premature beats    noted on EKG 05/18/12   Wrist pain, left    Past Surgical History:  Procedure Laterality Date   COLON SURGERY  09/17/2008   poylps/hemorriods   WISDOM TOOTH EXTRACTION  03/2009   Social History:   reports that he has quit smoking. His smoking use included cigarettes. He has never used smokeless tobacco. He reports that he does not currently use alcohol. He reports that he does not use drugs.  Family History  Problem Relation Age of Onset   Heart disease Mother    Hypertension Mother    Diabetes Mother    Depression Mother    Cancer Father        2004 bladder cancer   Alcohol abuse Father     Medications: Patient's Medications  New Prescriptions   No medications on file  Previous Medications   ALBUTEROL (VENTOLIN HFA) 108 (90 BASE) MCG/ACT INHALER    Inhale 2 puffs into the lungs every 6 (six) hours as needed  for wheezing or shortness of breath.   ASPIRIN EC 81 MG TABLET    Take 81 mg by mouth every other day.   CHOLECALCIFEROL (VITAMIN D3) 25 MCG (1000 UT) TABLET    Take 1,000 Units by mouth daily.   DICLOFENAC (VOLTAREN) 75 MG EC TABLET    Take 75 mg by mouth 2 (two) times daily.   DULOXETINE (CYMBALTA) 20 MG CAPSULE    Take 1 capsule (20 mg total) by mouth daily.   EPINEPHRINE 0.3 MG/0.3 ML IJ SOAJ INJECTION    Inject 0.31ml into the muscle as needed for allergic reaction   HYDROCODONE-ACETAMINOPHEN (NORCO) 7.5-325 MG TABLET    Take 1 tablet by mouth every 4 (four) hours as needed for moderate pain or severe pain.   LORAZEPAM (ATIVAN) 0.5 MG TABLET    Take 1 tablet (0.5 mg total) by mouth 2 (two) times daily as needed. for anxiety   MONTELUKAST (SINGULAIR) 10 MG  TABLET    Take 10 mg by mouth as needed.   MULTIPLE VITAMIN (MULTIVITAMIN) TABLET    Take 1 tablet by mouth daily.   SILDENAFIL (REVATIO) 20 MG TABLET    Take 2-5 tablets prior to intercourse   ZINC GLUCONATE 50 MG TABLET    Take 50 mg by mouth daily.  Modified Medications   No medications on file  Discontinued Medications   MELOXICAM (MOBIC) 15 MG TABLET    Take 7.5 mg by mouth as needed for pain.    Physical Exam:  Vitals:   12/23/20 1033  BP: 122/74  Pulse: 81  Temp: (!) 97.1 F (36.2 C)  TempSrc: Temporal  SpO2: 98%  Weight: 183 lb (83 kg)  Height: 6\' 1"  (1.854 m)   Body mass index is 24.14 kg/m. Wt Readings from Last 3 Encounters:  12/23/20 183 lb (83 kg)  10/25/20 186 lb (84.4 kg)  04/18/20 192 lb 14.4 oz (87.5 kg)    Physical Exam Constitutional:      General: He is not in acute distress.    Appearance: He is well-developed. He is not diaphoretic.  HENT:     Head: Normocephalic and atraumatic.     Right Ear: External ear normal.     Left Ear: External ear normal.     Mouth/Throat:     Pharynx: No oropharyngeal exudate.  Eyes:     Conjunctiva/sclera: Conjunctivae normal.     Pupils: Pupils are equal, round, and reactive to light.  Cardiovascular:     Rate and Rhythm: Normal rate and regular rhythm.     Heart sounds: Normal heart sounds.  Pulmonary:     Effort: Pulmonary effort is normal.     Breath sounds: Normal breath sounds.  Abdominal:     General: Bowel sounds are normal.     Palpations: Abdomen is soft.  Musculoskeletal:        General: No tenderness.     Cervical back: Normal range of motion and neck supple.     Right lower leg: No edema.     Left lower leg: No edema.  Skin:    General: Skin is warm and dry.  Neurological:     Mental Status: He is alert and oriented to person, place, and time.    Labs reviewed: Basic Metabolic Panel: Recent Labs    02/01/20 1028 10/25/20 1111  NA 137 139  K 4.7 4.5  CL 106 107  CO2 24 25  GLUCOSE  101* 94  BUN 21 15  CREATININE 1.01  0.85  CALCIUM 9.3 9.2   Liver Function Tests: Recent Labs    02/01/20 1028 10/25/20 1111  AST 14 15  ALT 17 14  BILITOT 0.5 0.5  PROT 6.4 6.4   No results for input(s): LIPASE, AMYLASE in the last 8760 hours. No results for input(s): AMMONIA in the last 8760 hours. CBC: Recent Labs    02/01/20 1028 10/25/20 1111  WBC 6.2 4.8  NEUTROABS 3,652 2,837  HGB 13.4 13.3  HCT 39.4 41.1  MCV 84.2 86.5  PLT 227 203   Lipid Panel: Recent Labs    02/01/20 1028 10/25/20 1111  CHOL 226* 219*  HDL 85 81  LDLCALC 117* 121*  TRIG 127 73  CHOLHDL 2.7 2.7   TSH: No results for input(s): TSH in the last 8760 hours. A1C: Lab Results  Component Value Date   HGBA1C 5.5 02/01/2020     Assessment/Plan 1. Anaphylaxis, sequela Needs new due to expiration  - EPINEPHrine 0.3 mg/0.3 mL IJ SOAJ injection; Inject 0.59ml into the muscle as needed for allergic reaction  Dispense: 2 each; Refill: 1   2. Anxiety state -continues on Cymbalta to help with anxiety, will increase to 40 mg daily hopefully will see increase in therapeutic effect with increase dose.  Also encouraged to see CBT -continues on lorazepam with a slow titration to wean. He is basically taking 1/4 of what he was previously taking in the evening. He is very self motivated and driven to decrease use. - LORazepam (ATIVAN) 0.5 MG tablet; Take 1 tablet (0.5 mg total) by mouth at bedtime. for anxiety  Dispense: 30 tablet; Refill: 0  3. Spinal stenosis of lumbar region with radiculopathy Ongoing pain, he has been on long term opioid therapy. Hopefully will increase in Cymbalta he will not need as much hydrocodone.  -continues on meloxicam  - DULoxetine (CYMBALTA) 20 MG capsule; Take 2 capsules (40 mg total) by mouth daily.  Dispense: 60 capsule; Refill: 1  Next appt: 3 months, sooner if needed Orvil Faraone K. Biagio Borg  [facility] [phone]   Total time 30  mins:  time greater than 50% of total time spent doing pt counseling and coordination of care

## 2020-12-23 NOTE — Patient Instructions (Addendum)
Look into cognitive behavioral therapist for anxiety   increase Cymbalta to 40 mg by mouth daily to help with anxiety and pain.

## 2020-12-30 ENCOUNTER — Other Ambulatory Visit: Payer: Self-pay | Admitting: *Deleted

## 2020-12-30 DIAGNOSIS — M542 Cervicalgia: Secondary | ICD-10-CM

## 2020-12-30 DIAGNOSIS — F411 Generalized anxiety disorder: Secondary | ICD-10-CM

## 2020-12-30 MED ORDER — LORAZEPAM 0.5 MG PO TABS: 0.5000 mg | ORAL_TABLET | Freq: Every day | ORAL | 0 refills | Status: DC

## 2020-12-30 MED ORDER — HYDROCODONE-ACETAMINOPHEN 7.5-325 MG PO TABS: 1.0000 | ORAL_TABLET | ORAL | 0 refills | Status: DC | PRN

## 2020-12-30 NOTE — Telephone Encounter (Signed)
Patient requested refill.  Epic LR: 11/28/2020 Contract on [employer] Rx and sent to [place] for approval.

## 2021-01-27 ENCOUNTER — Telehealth: Payer: Self-pay | Admitting: Neurology

## 2021-01-27 NOTE — Telephone Encounter (Signed)
Pt called asking if he is needing a yearly follow up with [facility]. He was last seen back in 2020.

## 2021-01-29 ENCOUNTER — Encounter: Payer: Self-pay | Admitting: Neurology

## 2021-01-31 ENCOUNTER — Other Ambulatory Visit: Payer: Self-pay | Admitting: *Deleted

## 2021-01-31 DIAGNOSIS — M542 Cervicalgia: Secondary | ICD-10-CM

## 2021-01-31 DIAGNOSIS — F411 Generalized anxiety disorder: Secondary | ICD-10-CM

## 2021-01-31 NOTE — Telephone Encounter (Signed)
Patient requested refill.  Epic LR: 12/30/2020 Contract Date: 10/25/2020 Pended Rx and sent to [place] for approval.  

## 2021-01-31 NOTE — Telephone Encounter (Signed)
Patient called back checking on his Rx refill Status.

## 2021-02-01 ENCOUNTER — Other Ambulatory Visit: Payer: Self-pay | Admitting: Adult Health

## 2021-02-01 DIAGNOSIS — M542 Cervicalgia: Secondary | ICD-10-CM

## 2021-02-01 DIAGNOSIS — F411 Generalized anxiety disorder: Secondary | ICD-10-CM

## 2021-02-01 MED ORDER — LORAZEPAM 0.5 MG PO TABS: 0.5000 mg | ORAL_TABLET | Freq: Every day | ORAL | 0 refills | Status: DC

## 2021-02-01 MED ORDER — HYDROCODONE-ACETAMINOPHEN 7.5-325 MG PO TABS: 1.0000 | ORAL_TABLET | ORAL | 0 refills | Status: DC | PRN

## 2021-02-03 ENCOUNTER — Ambulatory Visit (INDEPENDENT_AMBULATORY_CARE_PROVIDER_SITE_OTHER): Payer: 59 | Admitting: Neurology

## 2021-02-03 ENCOUNTER — Encounter: Payer: Self-pay | Admitting: Neurology

## 2021-02-03 ENCOUNTER — Other Ambulatory Visit: Payer: Self-pay

## 2021-02-03 VITALS — BP 116/70 | HR 81 | Ht 72.0 in | Wt 185.0 lb

## 2021-02-03 DIAGNOSIS — G4733 Obstructive sleep apnea (adult) (pediatric): Secondary | ICD-10-CM | POA: Diagnosis not present

## 2021-02-03 DIAGNOSIS — J343 Hypertrophy of nasal turbinates: Secondary | ICD-10-CM

## 2021-02-03 NOTE — Patient Instructions (Signed)
I ordered a HOME SLEEP TEST.  I will order a new CPAP based on the results, and will share the results with you.

## 2021-02-03 NOTE — Progress Notes (Signed)
SLEEP MEDICINE CLINIC   Provider:  Melvyn Novas, M D  Referring Provider: [name], NP Primary Care Physician:  [name], NP  Chief Complaint  Patient presents with   Follow-up    RM 10. Last seen 12/26/2018. Here for yearly CPAP f/u. Pt reports last month his new machine started to make a noise and went back to his old machine for 2 weeks while he went camping. Pt had reached out to DME but had no appt at the time. Once he returned he went back to his new CPAP and noise was gone. Pt reports currently doing well.    02-03-2021 Rv with need for a new CPAP machine his is 60 years old.  He is highly compliant and has noted a sound that is concerning.  Residual AHI is 1.6/h. Last sleep study was 2015, I will order a retest by HST.     12-26-2018, 60 year old caucasian , right handed Male patient ,[name]. situational depressed.  GDS 12/ 15 point. [name] has is usually has high compliance with CPAP use with an average of 93% equal to 7 hours and 45 minutes at night.  CPAP is an air sense 10 AutoSet currently with a setting of 8 cm water pressure to centimeter EPR and a residual AHI of 1.4 which is an excellent resolution there are some airleak data however that seemed not to influence the apnea index.  I think the high current endorsement of the geriatric depression scale is more of a reactive situational score the fatigue severity is endorsed at 12 out of 61 years and would be much slightly higher if a true clinical depression would be present.  Epworth sleepiness course endorsed at 1 out of 24 points.    Follow up for this 60 year old caucasian male patient on CPAP.  12-21-2017, I have the pleasure of meeting [name] today for his yearly CPAP compliance visit.  He has used a CPAP 80% for the last month but has been a gap of 3 days which is highly unusual and his average user time on days used is 7 hours and 45 minutes, he has a very  low residual AHI of 1.4/h of sleep, set at 8 cmH2O was to submit a EPR, there are some high air leaks.  I have always felt that these were related to facial hair but he is using a nasal pillow.  12-16-2016 He reports from his travel adventure in [place]. [name] just returned from a trip to [place], [name], with an average user time currently on days used of 8 hours and 40 minutes. He used to machine 18 out of 30 days again this was during his travel time- he used his old CPAP as his travel CPAP ( OSA treated since 2002). CPAP is set at 8:30 meter water with 3 cm EPR residual AHI is only 1.8 there are some higher air leaks probably attributable to his facial hair. That needs to be no change in settings. Epworth sleepiness score is 2 points fatigue severity 12 points also no concern from the side. I would like for him to continue using CPAP nightly.     Referral from Dr. Janyth Contes for a sleep consultation. Mr.James [name] is a right-handed Caucasian gentleman who underwent a sleep study on 08-23-09 at [facility],  which documented an AHI of 22.3.  His primary care physician had referred him after her the patient was to hold during a camping trip that fellow travelers had noticed him to snore loudly and have frequent apneas. He also has severe restless legs. He returned for a titration study on 11-22-2009 and a respironics nasal pillow in  medium size was used to. The patient was titrated to 8 cm water, at which pressure his AHI current of 0.0. He was able to sleep under 10 minutes under the pressure. Some snoring was still noted but to a much lower volume.  Bisacodyl has been compliant he using her CPAP ever since and for the first time today I see her download.  He had been lost in followup also he tried to make an appointment with our sleep clinic.  The CPAP download dated 11-05-13 encompassed  90 days,  an average daily  user time of 7 hours and 39 minutes , 100% compliance of 4 hours. The machine is set at 8 cm of water without EPR and his residual AHI is 1.6. In spite of being on CPAP, he cannot sleep in the same bed with his partner, reporting RLS.  It seems, he has not RLS, but PLM s, bothering his partner.  The patient works at night, goes to bed at 1.30 AM and once he puts his CPAP on, he is promptly asleep. He never needs an alarm clock to rise. Sleeps from 1.45 to 9.30 and mainly uninterrputed, only one nocturia.  Work starts at 2.30 PM.  He drinks sweet iced tea, mainly water.   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 He is 100% compliant with the use of CPAP over 4 hours nightly on a 30 day download obtained in office today. Average user time is 7 hours 59 minutes set pressure is 8 cm water without EPR and AHI is 1.9. He does have some air leaks but this is related to his facial hair. Always some Epworth sleepiness score is endorsed at 0. Fatigue severity is endorsed at 0. The patient's partner sleeps in a different room actually on a different floor of the house. Was begun but the patient was still snoring and kicking a lot at night, so he is not able to bear witness to how the sleep habits have changed. A she uses a 60 year old ResMed machine and he would like to have a more trouble friendly option available lighter and perhaps quieter. The patient has noticed that the machine is noisier than it used to be. He would also like to obtain a car [employer]pta to be able to camp and uses CPAP machine. In addition he travels long distances and would like to have a CPAP machine that would be usable on a  Plane. 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  Interval history 12/23/2015, The pleasure of seeing Mr.  James [name], today for a CPAP compliance visit his residual  AHI is 1.78 cm water pressure without EPR and he has used the machine 100% of the last 30 days and each of those nights over 4 hours. His average user time is 8 hours and 7 minutes. No adjustments have to be made. He does have some higher air leaks but he is not bothered by it. He does like to use a nasal pillow. His Epworth sleepiness score is endorsed at 0,  The patient has a history of childhood asthma and was a smoker for many  ( 20 ?) years. Quit in 2001.   Review of Systems: Out of a complete 14 system review, the patient complains of only the following symptoms, and all other reviewed systems are negative.  tinnitus in the left, some fatigue related to shift work.   Epworth score 1 points.  , Fatigue severity score zero , depression score 12/ 15 points.    Social History   Socioeconomic History   Marital status: Married    Spouse name: [name]   Number of children: 0   Years of education: 13   Highest education level: Not on file  Occupational History   Occupation: Marine scientist: [employer]  Tobacco Use   Smoking status: Former    Types: Cigarettes   Smokeless tobacco: Never   Tobacco comments:    Quit 23 years ago as of 2022  Substance and Sexual Activity   Alcohol use: Not Currently   Drug use: No   Sexual activity: Not on file  Other Topics Concern   Not on file  Social History Narrative   Patient is married ([name]) and lives at home with his husband.   Patient has a college education.   Patient works full-time ([employer]).   Patient is right-handed.   Patient drinks one cup of coffee daily.   Social Determinants of Health   Financial Resource Strain: Not on file  Food Insecurity: Not on file  Transportation Needs: Not on file  Physical Activity: Not on file  Stress: Not on file  Social Connections: Not on file  Intimate Partner Violence: Not on file    Family History  Problem Relation Age of Onset   Heart disease Mother     Hypertension Mother    Diabetes Mother    Depression Mother    Cancer Father        2004 bladder cancer   Alcohol abuse Father     Past Medical History:  Diagnosis Date   Anxiety    Asthma    BPH (benign prostatic hyperplasia)    Cervicalgia    degenerative changes and foraminal stenoses   Chest pain, unspecified    Depression    Family history of colonic polyps    Father with benign colon polyp   Headache, tension type, chronic 01/2012   Hepatitis B antibody positive 1984   Hiatal hernia 09/19/10   Hyperglycemia    104 on 10/10/10   Hyperlipidemia    Obstructive sleep apnea (adult) (pediatric)    using CPAP   Other malaise and fatigue    Supraventricular premature beats    noted on EKG 05/18/12   Wrist pain, left     Past Surgical History:  Procedure Laterality Date   COLON SURGERY  09/17/2008   poylps/hemorriods   WISDOM TOOTH EXTRACTION  03/2009    Current Outpatient Medications  Medication Sig Dispense Refill   albuterol (VENTOLIN HFA) 108 (90 Base) MCG/ACT inhaler Inhale 2 puffs into the lungs every 6 (six) hours as needed for wheezing or shortness of breath. 8 g 2   aspirin EC 81 MG tablet Take 81 mg by mouth every other day.     cholecalciferol (VITAMIN D3) 25 MCG (1000 UT) tablet Take 1,000 Units by mouth daily.     diclofenac (VOLTAREN) 75 MG EC tablet Take 75 mg by mouth 2 (two) times daily.  DULoxetine (CYMBALTA) 20 MG capsule Take 2 capsules (40 mg total) by mouth daily. 60 capsule 1   EPINEPHrine 0.3 mg/0.3 mL IJ SOAJ injection Inject 0.57ml into the muscle as needed for allergic reaction 2 each 1   HYDROcodone-acetaminophen (NORCO) 7.5-325 MG tablet Take 1 tablet by mouth every 4 (four) hours as needed for moderate pain or severe pain. 180 tablet 0   LORazepam (ATIVAN) 0.5 MG tablet Take 1 tablet (0.5 mg total) by mouth at bedtime. for anxiety 30 tablet 0   montelukast (SINGULAIR) 10 MG tablet Take 10 mg by mouth as needed.     Multiple Vitamin  (MULTIVITAMIN) tablet Take 1 tablet by mouth daily.     sildenafil (REVATIO) 20 MG tablet Take 2-5 tablets prior to intercourse 50 tablet 4   zinc gluconate 50 MG tablet Take 50 mg by mouth daily.     No current facility-administered medications for this visit.    Allergies as of 02/03/2021 - Review Complete 12/23/2020  Allergen Reaction Noted   Bee venom Anaphylaxis 02/06/2015   Codeine Other (See Comments) 06/20/2014   Pyridoxine Other (See Comments) 06/20/2014   Skelaxin [metaxalone] Other (See Comments) 06/06/2013   Augmentin [amoxicillin-pot clavulanate] Nausea And Vomiting 06/06/2013   Sedapap [butalbital-acetaminophen] Other (See Comments) 06/06/2013    Vitals: BP 116/70   Pulse 81   Ht 6' (1.829 m)   Wt 185 lb (83.9 kg)   BMI 25.09 kg/m  Last Weight:  Wt Readings from Last 1 Encounters:  02/03/21 185 lb (83.9 kg)       Last Height:   Ht Readings from Last 1 Encounters:  02/03/21 6' (1.829 m)    Physical exam:  General: The patient is awake, alert and appears not in acute distress.  No interval medical problem.  The patient is well groomed. Head: Normocephalic, atraumatic. Neck is supple. Mallampati 2   neck circumference: 15 inches . Nasal airflow restricted, mustache, TMJ is evident. Retrognathia is not seen.  Cardiovascular: Regular rate and rhythm , without murmurs or carotid bruit, and without distended neck veins. Respiratory: Lungs are clear to auscultation. Skin:  Without evidence of edema or rash. Trunk: BMI is elevated and patient has normal posture.  Neurologic exam : The patient is awake and alert, oriented to place and time.   Memory subjective described as intact.   Cranial nerves:no loss of smell and tatste.  Pupils are equal and briskly reactive to light.  Extraocular movements  in vertical and horizontal planes intact and without nystagmus.  Facial motor strength is symmetric wiht tongue and uvula moving in midline. Motor exam: Normal tone   muscle bulk and symmetric, strength in all extremities. Sensory: Fine touch, pinprick and vibration were tested in all extremities.   Assessment: After physical and neurologic examination, review of laboratory studies, imaging, neurophysiology testing and pre-existing records, assessment is   1) OSA - very well controlled on CPAP - 100% compliance - 80% on home machine.  He uses 2 different CPAPs.- one for travel and one for home use.   The patient was advised of the nature of the diagnosed sleep disorder , the treatment options and risks for general a health and wellness arising from not treating the condition. Visit duration was 20 minutes.   Plan:  Needs HST , one night off CPAP and new machine to be ordered. New CPAP  will be set around 8 cm water, 2 cm EPR.   PS : He is struggling with anxiety, ativan  weaned off, he has been able to dream again.    Porfirio Mylar Abdulloh Ullom MD  02/03/2021

## 2021-02-20 ENCOUNTER — Other Ambulatory Visit: Payer: Self-pay | Admitting: Nurse Practitioner

## 2021-02-20 DIAGNOSIS — M48061 Spinal stenosis, lumbar region without neurogenic claudication: Secondary | ICD-10-CM

## 2021-03-03 ENCOUNTER — Other Ambulatory Visit: Payer: Self-pay | Admitting: *Deleted

## 2021-03-03 ENCOUNTER — Ambulatory Visit (INDEPENDENT_AMBULATORY_CARE_PROVIDER_SITE_OTHER): Payer: 59 | Admitting: Neurology

## 2021-03-03 DIAGNOSIS — J342 Deviated nasal septum: Secondary | ICD-10-CM

## 2021-03-03 DIAGNOSIS — F411 Generalized anxiety disorder: Secondary | ICD-10-CM

## 2021-03-03 DIAGNOSIS — G4733 Obstructive sleep apnea (adult) (pediatric): Secondary | ICD-10-CM | POA: Diagnosis not present

## 2021-03-03 DIAGNOSIS — Z9989 Dependence on other enabling machines and devices: Secondary | ICD-10-CM

## 2021-03-03 DIAGNOSIS — J343 Hypertrophy of nasal turbinates: Secondary | ICD-10-CM

## 2021-03-03 DIAGNOSIS — M542 Cervicalgia: Secondary | ICD-10-CM

## 2021-03-03 MED ORDER — HYDROCODONE-ACETAMINOPHEN 7.5-325 MG PO TABS: 1.0000 | ORAL_TABLET | ORAL | 0 refills | Status: DC | PRN

## 2021-03-03 MED ORDER — LORAZEPAM 0.5 MG PO TABS: 0.2500 mg | ORAL_TABLET | Freq: Every day | ORAL | 0 refills | Status: DC

## 2021-03-03 NOTE — Telephone Encounter (Signed)
Patient requested refill Request Epic LR: 02/01/2021 Contract Date: 10/25/2020 Pended Rx's and sent to [facility] for approval.

## 2021-03-04 NOTE — Progress Notes (Signed)
[facility] SLEEP TEST REPORT ( by Watch PAT)   STUDY DATE:  03-04-2021 DOB:  December 20, 1960    ORDERING CLINICIAN: Melvyn Novas, MD  REFERRING CLINICIAN: Abbey Chatters, NP   CLINICAL INFORMATION/HISTORY: 02-03-2021:  Here for yearly CPAP f/u. Pt reports last month his new machine started to make a noise and went back to his old machine for 2 weeks while he went camping. Pt had reached out to DME but had no appt at the time. Once he returned he went back to his new CPAP and noise was gone. Pt reports currently doing well.     02-03-2021 Rv with need for a new CPAP machine his is 60 years old.  He is highly compliant and has noted a sound that is concerning.  Residual AHI is 1.6/h. Last sleep study was 2015, I will order a retest by HST     Epworth sleepiness score: 1/24.   BMI: 25 kg/m   Neck Circumference: 15"   FINDINGS:   Sleep Summary:   Total Recording Time (hours, min): Total recording time for this home sleep test amounted to 9 hours and 9 minutes of which a total calculated sleep time was 7 hours and 36 minutes.  REM sleep was seen for 18.3% of the sleep study.       Respiratory Indices:   Calculated pAHI (per hour):     The apnea-hypopnea index was 18.9/h and during REM sleep slightly lower than in non-REM sleep. Non-REM sleep AHI was 19.6 and REM sleep AHI 16.1/h.                                          Positional AHI: A supine AHI was seen at 16.4/h in prone sleep which the patient seems to prefer the AHI was 29.6/h.  On the right and left side the AHI was under 10.  Snoring level was mild with brief exacerbations into loud snoring.  Snoring was present for 19.2% of the total sleep time but only for 2.7 minutes at moderate to loud levels.   The mean volume was 41 dB.                                                   Oxygen Saturation Statistics:  O2 Saturation Range (%): Oxygen saturation ranged between a nadir of 86% and a maximum  of 99% with a mean saturation of 95%.  Oxygen desaturation below 89% was only present for 0.1-minute.                                    O2 Saturation (minutes) <89%:      0% of sleep time     Pulse Rate Statistics:                 Pulse Range:   Varied between 47 and 105 bpm with a mean heart rate of 64 bpm.              IMPRESSION:  This HST confirms the presence of moderate sleep apnea that did not vary much between REM and non-REM sleep  and was only significant in prone sleep or supine sleep position.   RECOMMENDATION: The patient should avoid prone and supine position as both were associated with higher apnea and disease.  Since he has been a compliant CPAP user and feels good with this CPAP I will order a new machine for him this point will be an auto titration device with his CPAP function between 5 and 13 cmH2O.  2 cm EPR heated humidification of machine and tubing, and an interface of the patient's choice.  The patient prefers a Hotel manager. He does have a history of restless legs but  these cannot be evaluated with a home sleep test.    INTERPRETING PHYSICIAN:   Melvyn Novas, MD   Medical Director of [facility] at [facility].

## 2021-03-06 ENCOUNTER — Encounter: Payer: Self-pay | Admitting: Neurology

## 2021-03-06 NOTE — Addendum Note (Signed)
Addended by: Melvyn Novas on: 03/06/2021 01:36 PM   Modules accepted: Orders

## 2021-03-06 NOTE — Telephone Encounter (Signed)
°  Hello Mr. Obst; I expect CPAPs to be still on wait, due to supply chain issues. I forwarded your message to my staff and we will make this urgent. You may get a LUNA machine or RESVENT machine instead of the manufacturers we used to supply our patients.  You are on the list, it hopefull will not be longer than 2-4 weeks.  Sorry for this- and Merry Christmas- Yours, Melvyn Novas, MD .

## 2021-03-06 NOTE — Progress Notes (Signed)
IMPRESSION: This HST confirms the presence of moderate sleep apnea that did not vary much between REM and non-REM sleep and was only significant in prone sleep or supine sleep position.  RECOMMENDATION:The patient should avoid prone and supine position as both were associated with higher apnea and disease. Since he has been a compliant CPAP user at 8 cm water and feels good with his CPAP , I will order a new machine for him - an auto titration device with CPAP function between 5 and 13cmH2O. 2 cm EPR, heated humidification of machine and tubing,and aninterface of the patient's choice.The patient prefers a Interior and spatial designer. PS : He does havea history ofrestless legs butthese cannot be evaluatedwith a home sleep test.   INTERPRETING PHYSICIAN:  Melvyn Novas, MD

## 2021-03-06 NOTE — Procedures (Signed)
° ° °  °  °[facility] °  °HOME SLEEP TEST REPORT ( by Watch PAT)   °STUDY DATE:  03-04-2021 °DOB:  10/15/1960 ° °  °ORDERING CLINICIAN: Kaylena Pacifico, MD  °REFERRING CLINICIAN: Jessica Eubanks, NP °  °CLINICAL INFORMATION/HISTORY: 02-03-2021:  °Here for yearly CPAP f/u. Pt reports last month his new machine started to make a noise and went back to his old machine for 2 weeks while he went camping. Pt had reached out to DME but had no appt at the time. Once he returned he went back to his new CPAP and noise was gone. Pt reports currently doing well.   °  °02-03-2021 Rv with need for a new CPAP machine his is 60 years old.  °He is highly compliant and has noted a sound that is concerning.  °Residual AHI is 1.6/h. °Last sleep study was 2015, I will order a retest by HST ° ° °  °Epworth sleepiness score: 1/24. °  °BMI: 25 kg/m² °  °Neck Circumference: 15" °  °FINDINGS: °  °Sleep Summary: °  °Total Recording Time (hours, min): Total recording time for this home sleep test amounted to 9 hours and 9 minutes of which a total calculated sleep time was 7 hours and 36 minutes.  REM sleep was seen for 18.3% of the sleep study.     °  °Respiratory Indices: °  °Calculated pAHI (per hour):     The apnea-hypopnea index was 18.9/h and during REM sleep slightly lower than in non-REM sleep. °Non-REM sleep AHI was 19.6 and REM sleep AHI 16.1/h.                      °                    °Positional AHI: A supine AHI was seen at 16.4/h in prone sleep which the patient seems to prefer the AHI was 29.6/h.  On the right and left side the AHI was under 10. ° °Snoring level was mild with brief exacerbations into loud snoring.  Snoring was present for 19.2% of the total sleep time but only for 2.7 minutes at moderate to loud levels.   °The mean volume was 41 dB.                                                 °  °Oxygen Saturation Statistics: ° °O2 Saturation Range (%): Oxygen saturation ranged between a nadir of 86% and a maximum  of 99% with a mean saturation of 95%.  Oxygen desaturation below 89% was only present for 0.1-minute.                                   ° °O2 Saturation (minutes) <89%:      0% of sleep time   °  °Pulse Rate Statistics: °              °  °Pulse Range:   Varied between 47 and 105 bpm with a mean heart rate of 64 bpm.            °  °IMPRESSION:  This HST confirms the presence of moderate sleep apnea that did not vary much between REM and non-REM sleep   sleep and was only significant in prone sleep or supine sleep position.   RECOMMENDATION: The patient should avoid prone and supine position as both were associated with higher apnea and disease.  Since he has been a compliant CPAP user and feels good with this CPAP I will order a new machine for him this point will be an auto titration device with his CPAP function between 5 and 13 cmH2O.  2 cm EPR heated humidification of machine and tubing, and an interface of the patient's choice.  The patient prefers a Hotel manager. He does have a history of restless legs but  these cannot be evaluated with a home sleep test.     INTERPRETING PHYSICIAN:    Melvyn Novas, MD    Medical Director of [facility] at [facility].

## 2021-03-18 ENCOUNTER — Telehealth: Payer: Self-pay | Admitting: Neurology

## 2021-03-18 NOTE — Telephone Encounter (Signed)
Order was sent to the DME company 03/10/2021. I will send an email to have them check on status.

## 2021-03-18 NOTE — Telephone Encounter (Signed)
Pt called states the DME is missing the prescription for a new CPAP machine. Pt asking for the prescription to be sent with the word urgent on there. Pt is requesting a call back.

## 2021-04-03 ENCOUNTER — Other Ambulatory Visit: Payer: Self-pay

## 2021-04-03 DIAGNOSIS — F411 Generalized anxiety disorder: Secondary | ICD-10-CM

## 2021-04-03 DIAGNOSIS — M542 Cervicalgia: Secondary | ICD-10-CM

## 2021-04-03 MED ORDER — HYDROCODONE-ACETAMINOPHEN 7.5-325 MG PO TABS: 1.0000 | ORAL_TABLET | ORAL | 0 refills | Status: DC | PRN

## 2021-04-03 MED ORDER — LORAZEPAM 0.5 MG PO TABS: 0.2500 mg | ORAL_TABLET | Freq: Every day | ORAL | 0 refills | Status: DC

## 2021-04-03 NOTE — Telephone Encounter (Signed)
RX's last refilled on 03/03/2021  Treatment agreement on file and up to date

## 2021-04-09 ENCOUNTER — Telehealth: Payer: Self-pay | Admitting: Neurology

## 2021-04-09 NOTE — Telephone Encounter (Signed)
Pt called stating that when he received his new cpap machine he was given a Luna machine and he states that he was to be given a Resmed. Pt would like to discuss with Provider.

## 2021-04-09 NOTE — Telephone Encounter (Addendum)
Called the patient back. I advised him that I have contacted the company and they are reviewing this information and will get him taken care of. Pt verbalized understanding. Pt advised to send me a message if he has any other issues

## 2021-04-09 NOTE — Telephone Encounter (Signed)
I have reached out to the DME company. The pt should have been set up with [facility]. I will wait first to hear from the DME company

## 2021-04-11 NOTE — Telephone Encounter (Signed)
Pt called states he went to the DME company pretty much told him they don't have his prescription. Pt would like to use another DME company. Pt requesting a call back.

## 2021-04-14 ENCOUNTER — Encounter: Payer: Self-pay | Admitting: Nurse Practitioner

## 2021-04-14 ENCOUNTER — Ambulatory Visit (INDEPENDENT_AMBULATORY_CARE_PROVIDER_SITE_OTHER): Payer: 59 | Admitting: Nurse Practitioner

## 2021-04-14 ENCOUNTER — Other Ambulatory Visit: Payer: Self-pay

## 2021-04-14 VITALS — BP 130/80 | HR 102 | Temp 97.5°F | Ht 72.0 in | Wt 191.0 lb

## 2021-04-14 DIAGNOSIS — E785 Hyperlipidemia, unspecified: Secondary | ICD-10-CM | POA: Diagnosis not present

## 2021-04-14 DIAGNOSIS — F411 Generalized anxiety disorder: Secondary | ICD-10-CM

## 2021-04-14 DIAGNOSIS — R739 Hyperglycemia, unspecified: Secondary | ICD-10-CM

## 2021-04-14 DIAGNOSIS — G4733 Obstructive sleep apnea (adult) (pediatric): Secondary | ICD-10-CM

## 2021-04-14 DIAGNOSIS — M48061 Spinal stenosis, lumbar region without neurogenic claudication: Secondary | ICD-10-CM

## 2021-04-14 DIAGNOSIS — M5416 Radiculopathy, lumbar region: Secondary | ICD-10-CM

## 2021-04-14 LAB — CBC WITH DIFFERENTIAL/PLATELET
Absolute Monocytes: 481 cells/uL (ref 200–950)
Basophils Absolute: 52 cells/uL (ref 0–200)
Basophils Relative: 0.9 %
Eosinophils Absolute: 168 cells/uL (ref 15–500)
Eosinophils Relative: 2.9 %
HCT: 41.6 % (ref 38.5–50.0)
Hemoglobin: 13.8 g/dL (ref 13.2–17.1)
Lymphs Abs: 1914 cells/uL (ref 850–3900)
MCH: 28.3 pg (ref 27.0–33.0)
MCHC: 33.2 g/dL (ref 32.0–36.0)
MCV: 85.2 fL (ref 80.0–100.0)
MPV: 12.5 fL (ref 7.5–12.5)
Monocytes Relative: 8.3 %
Neutro Abs: 3184 cells/uL (ref 1500–7800)
Neutrophils Relative %: 54.9 %
Platelets: 209 10*3/uL (ref 140–400)
RBC: 4.88 10*6/uL (ref 4.20–5.80)
RDW: 13.3 % (ref 11.0–15.0)
Total Lymphocyte: 33 %
WBC: 5.8 10*3/uL (ref 3.8–10.8)

## 2021-04-14 LAB — COMPLETE METABOLIC PANEL WITH GFR
AG Ratio: 1.9 (calc) (ref 1.0–2.5)
ALT: 15 U/L (ref 9–46)
AST: 19 U/L (ref 10–35)
Albumin: 4.4 g/dL (ref 3.6–5.1)
Alkaline phosphatase (APISO): 50 U/L (ref 35–144)
BUN: 18 mg/dL (ref 7–25)
CO2: 28 mmol/L (ref 20–32)
Calcium: 9.9 mg/dL (ref 8.6–10.3)
Chloride: 105 mmol/L (ref 98–110)
Creat: 0.85 mg/dL (ref 0.70–1.35)
Globulin: 2.3 g/dL (calc) (ref 1.9–3.7)
Glucose, Bld: 99 mg/dL (ref 65–99)
Potassium: 4.5 mmol/L (ref 3.5–5.3)
Sodium: 140 mmol/L (ref 135–146)
Total Bilirubin: 0.4 mg/dL (ref 0.2–1.2)
Total Protein: 6.7 g/dL (ref 6.1–8.1)
eGFR: 98 mL/min/{1.73_m2} (ref >=60)

## 2021-04-14 MED ORDER — DULOXETINE HCL 60 MG PO CPEP: 60.0000 mg | ORAL_CAPSULE | Freq: Every day | ORAL | 1 refills | Status: DC

## 2021-04-14 NOTE — Progress Notes (Signed)
Careteam: Patient Care Team: [name], NP as PCP - General (Geriatric Medicine)  PLACE OF SERVICE:  [facility]  Advanced Directive information Does Patient Have a Medical Advance Directive?: No, Would patient like information on creating a medical advance directive?: No - Patient declined  Allergies  Allergen Reactions   Bee Venom Anaphylaxis   Codeine Other (See Comments)   Pyridoxine Other (See Comments)    Made him crazy   Skelaxin [Metaxalone] Other (See Comments)   Augmentin [Amoxicillin-Pot Clavulanate] Nausea And Vomiting   Sedapap [Butalbital-Acetaminophen] Other (See Comments)    Chief Complaint  Patient presents with   Medical Management of Chronic Issues    3 month follow-up. Discuss need for coloscopy and covid boosters or post pone if patient refuses. Discuss medications and someone to talk to about daily life stuff.      HPI: Patient is a 61 y.o. male for routine follow up.   Reports he is tolerating cymbalta very well. He is taking cymbalta 40 mg.   Taking ativan and trying to reduce Reports he went back to half mg and taking every other night.   He is now in an [facility] group helping him move through some childhood trauma. Has angry issues.   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   Marlinda Mike June 03, 1924  Has follow up colonoscopy scheduled for February  He has a goal to stop eating cookies and start going back to the gym.    He has gotten exercises for his hip and much better.  Review of Systems:  Review of Systems  Constitutional:  Negative for chills, fever and weight loss.  HENT:  Negative for tinnitus.   Respiratory:  Negative for cough, sputum production and shortness of breath.   Cardiovascular:  Negative for chest pain, palpitations and leg swelling.  Gastrointestinal:  Negative for abdominal pain, constipation, diarrhea and heartburn.   Genitourinary:  Negative for dysuria, frequency and urgency.  Musculoskeletal:  Positive for back pain and joint pain. Negative for falls and myalgias.  Skin: Negative.   Neurological:  Negative for dizziness and headaches.  Psychiatric/Behavioral:  Negative for depression and memory loss. The patient is nervous/anxious. The patient does not have insomnia.    Past Medical History:  Diagnosis Date   Anxiety    Asthma    BPH (benign prostatic hyperplasia)    Cervicalgia    degenerative changes and foraminal stenoses   Chest pain, unspecified    Depression    Family history of colonic polyps    Father with benign colon polyp   Headache, tension type, chronic 01/2012   Hepatitis B antibody positive 1984   Hiatal hernia 09/19/10   Hyperglycemia    104 on 10/10/10   Hyperlipidemia    Obstructive sleep apnea (adult) (pediatric)    using CPAP   Other malaise and fatigue    Supraventricular premature beats    noted on EKG 05/18/12   Wrist pain, left    Past Surgical History:  Procedure Laterality Date   COLON SURGERY  09/17/2008   poylps/hemorriods   WISDOM TOOTH EXTRACTION  03/2009   Social History:   reports that he has quit smoking. His smoking use included cigarettes. He has never used smokeless tobacco. He reports that he does not currently use alcohol. He reports that he does not use drugs.  Family History  Problem Relation Age  of Onset   Heart disease Mother    Hypertension Mother    Diabetes Mother    Depression Mother    Cancer Father        2004 bladder cancer   Alcohol abuse Father     Medications: Patient's Medications  New Prescriptions   No medications on file  Previous Medications   ALBUTEROL (VENTOLIN HFA) 108 (90 BASE) MCG/ACT INHALER    Inhale 2 puffs into the lungs every 6 (six) hours as needed for wheezing or shortness of breath.   ASPIRIN EC 81 MG TABLET    Take 81 mg by mouth every other day.   CHOLECALCIFEROL (VITAMIN D3) 25 MCG (1000 UT) TABLET     Take 1,000 Units by mouth daily.   DICLOFENAC (VOLTAREN) 75 MG EC TABLET    Take 75 mg by mouth 2 (two) times daily.   DULOXETINE (CYMBALTA) 20 MG CAPSULE    TAKE 2 CAPSULES(40 MG) BY MOUTH DAILY   EPINEPHRINE 0.3 MG/0.3 ML IJ SOAJ INJECTION    Inject 0.49ml into the muscle as needed for allergic reaction   HYDROCODONE-ACETAMINOPHEN (NORCO) 7.5-325 MG TABLET    Take 1 tablet by mouth every 4 (four) hours as needed for moderate pain or severe pain.   LORAZEPAM (ATIVAN) 0.5 MG TABLET    Take 0.5-1 tablets (0.25-0.5 mg total) by mouth at bedtime. for anxiety   MONTELUKAST (SINGULAIR) 10 MG TABLET    Take 10 mg by mouth as needed.   MULTIPLE VITAMIN (MULTIVITAMIN) TABLET    Take 1 tablet by mouth daily.   SILDENAFIL (REVATIO) 20 MG TABLET    Take 2-5 tablets prior to intercourse   ZINC GLUCONATE 50 MG TABLET    Take 50 mg by mouth daily.  Modified Medications   No medications on file  Discontinued Medications   No medications on file    Physical Exam:  Vitals:   04/14/21 1016  BP: 130/80  Pulse: (!) 102  Temp: (!) 97.5 F (36.4 C)  TempSrc: Temporal  SpO2: 96%  Weight: 191 lb (86.6 kg)  Height: 6' (1.829 m)   Body mass index is 25.9 kg/m. Wt Readings from Last 3 Encounters:  04/14/21 191 lb (86.6 kg)  02/03/21 185 lb (83.9 kg)  12/23/20 183 lb (83 kg)    Physical Exam Constitutional:      General: He is not in acute distress.    Appearance: He is well-developed. He is not diaphoretic.  HENT:     Head: Normocephalic and atraumatic.     Right Ear: External ear normal.     Left Ear: External ear normal.     Mouth/Throat:     Pharynx: No oropharyngeal exudate.  Eyes:     Conjunctiva/sclera: Conjunctivae normal.     Pupils: Pupils are equal, round, and reactive to light.  Cardiovascular:     Rate and Rhythm: Normal rate and regular rhythm.     Heart sounds: Normal heart sounds.  Pulmonary:     Effort: Pulmonary effort is normal.     Breath sounds: Normal breath sounds.   Abdominal:     General: Bowel sounds are normal.     Palpations: Abdomen is soft.  Musculoskeletal:        General: No tenderness.     Cervical back: Normal range of motion and neck supple.     Right lower leg: No edema.     Left lower leg: No edema.  Skin:    General: Skin is  warm and dry.  Neurological:     Mental Status: He is alert and oriented to person, place, and time.    Labs reviewed: Basic Metabolic Panel: Recent Labs    10/25/20 1111  NA 139  K 4.5  CL 107  CO2 25  GLUCOSE 94  BUN 15  CREATININE 0.85  CALCIUM 9.2   Liver Function Tests: Recent Labs    10/25/20 1111  AST 15  ALT 14  BILITOT 0.5  PROT 6.4   No results for input(s): LIPASE, AMYLASE in the last 8760 hours. No results for input(s): AMMONIA in the last 8760 hours. CBC: Recent Labs    10/25/20 1111  WBC 4.8  NEUTROABS 2,837  HGB 13.3  HCT 41.1  MCV 86.5  PLT 203   Lipid Panel: Recent Labs    10/25/20 1111  CHOL 219*  HDL 81  LDLCALC 121*  TRIG 73  CHOLHDL 2.7   TSH: No results for input(s): TSH in the last 8760 hours. A1C: Lab Results  Component Value Date   HGBA1C 5.5 02/01/2020     Assessment/Plan 1. Anxiety state -stable, he has been able to wean off lorazepam slowly. He is now taking lorazepam 0.5 mg every other night.  - at this time he will continue to wean to half tablet (0.25 mg)  lorazepam every other night for 2 weeks then will use ONLY if needed, goal to be completely off by next follow up  -will increase cymbalta to 60 mg daily to help with anxiety.   2. Hyperlipidemia, unspecified hyperlipidemia type -continue dietary modifications.  - CMP with eGFR([facility])  3. Hyperglycemia -continue dietary modifications, will follow up lab - CMP with eGFR([facility])  4. Obstructive sleep apnea (adult) (pediatric) On CPAP  5. Spinal stenosis of lumbar region with radiculopathy Continues to follow up with spine specialist and continues on hydrocodone/apap PRN - CBC  with Differential/Platelet - CMP with eGFR([facility]) - DULoxetine (CYMBALTA) 60 MG capsule; Take 1 capsule (60 mg total) by mouth daily.  Dispense: 90 capsule; Refill: 1   Next appt: 3 months.  James [name]. [place], [facility] [phone]

## 2021-04-14 NOTE — Telephone Encounter (Signed)
Called pt. He expressed concern about getting wrong machine from [employer]. He would like to change DME companies at this point. Aware we will send to [place] [phone] today for him and they will reach out to him once orders received sometime in the next week. He will call back if he does not hear from them.  He is aware manager of [employer], Jeneen Rinks, may still reach out to discuss what happened.  I faxed order to [facility] at [phone]. Received fax confirmation.

## 2021-04-14 NOTE — Patient Instructions (Addendum)
Cognitive behavioral therapist- call insurance and see who is in network    Start using lorazepam half tablet every other night for 2 weeks then cut back to every 3rd night for 2 weeks then do not use routinely. Use other tools if unable keep half tablet by the bed to use as needed  By April you should be off lorazepam completely, YOU CAN DO IT! :)   Tools to use -write down your worries beside your bed and schedule another "worry" time -body scanning, start with toes and go to head

## 2021-04-14 NOTE — Telephone Encounter (Signed)
Sent urgent email to [employer] contacts to follow up with pt today on this. Casey,RN had reached out last week to [employer] about this and were told they would handle this for pt to get his set up with [facility] instead (set up w/ [facility] by mistake).

## 2021-04-17 NOTE — Telephone Encounter (Signed)
Medication list updated.

## 2021-05-05 ENCOUNTER — Other Ambulatory Visit: Payer: Self-pay | Admitting: *Deleted

## 2021-05-05 DIAGNOSIS — F411 Generalized anxiety disorder: Secondary | ICD-10-CM

## 2021-05-05 DIAGNOSIS — M542 Cervicalgia: Secondary | ICD-10-CM

## 2021-05-05 MED ORDER — LORAZEPAM 0.5 MG PO TABS: 0.5000 mg | ORAL_TABLET | Freq: Every day | ORAL | 0 refills | Status: DC

## 2021-05-05 MED ORDER — HYDROCODONE-ACETAMINOPHEN 7.5-325 MG PO TABS: 1.0000 | ORAL_TABLET | ORAL | 0 refills | Status: DC | PRN

## 2021-05-05 NOTE — Telephone Encounter (Signed)
Patient called requesting refill.  Epic LR: 04/03/2021 Contract Date: 10/25/2020 Pended Rx's and sent to [facility] for approval.

## 2021-05-06 ENCOUNTER — Ambulatory Visit: Payer: 59 | Admitting: Neurology

## 2021-06-02 ENCOUNTER — Other Ambulatory Visit: Payer: Self-pay | Admitting: *Deleted

## 2021-06-02 DIAGNOSIS — F411 Generalized anxiety disorder: Secondary | ICD-10-CM

## 2021-06-02 DIAGNOSIS — M542 Cervicalgia: Secondary | ICD-10-CM

## 2021-06-02 MED ORDER — HYDROCODONE-ACETAMINOPHEN 7.5-325 MG PO TABS: 1.0000 | ORAL_TABLET | ORAL | 0 refills | Status: DC | PRN

## 2021-06-02 MED ORDER — LORAZEPAM 0.5 MG PO TABS: 0.2500 mg | ORAL_TABLET | Freq: Every day | ORAL | 0 refills | Status: DC

## 2021-06-02 NOTE — Telephone Encounter (Signed)
Patient requested refill.  ?Epic LR: 05/05/2021 ?Contract Date: 10/25/2020 ?Pended Rx and sent to [facility] for approval.  ? ?Patient stated that he has been out of town with his sister because her husband died.  ?Confirmed next appointment with patient.  ?

## 2021-06-04 LAB — HM COLONOSCOPY

## 2021-07-03 ENCOUNTER — Other Ambulatory Visit: Payer: Self-pay | Admitting: *Deleted

## 2021-07-03 DIAGNOSIS — M542 Cervicalgia: Secondary | ICD-10-CM

## 2021-07-03 MED ORDER — HYDROCODONE-ACETAMINOPHEN 7.5-325 MG PO TABS: 1.0000 | ORAL_TABLET | ORAL | 0 refills | Status: DC | PRN

## 2021-07-03 NOTE — Telephone Encounter (Signed)
Patient called and requesting refill ?Epic LR: 06/02/2021 ?Contract Date: 10/25/2020 ?Pended Rx and sent to [facility] for approval.  ?

## 2021-07-10 ENCOUNTER — Ambulatory Visit (INDEPENDENT_AMBULATORY_CARE_PROVIDER_SITE_OTHER): Payer: 59 | Admitting: Neurology

## 2021-07-10 ENCOUNTER — Encounter: Payer: Self-pay | Admitting: Neurology

## 2021-07-10 VITALS — BP 136/81 | HR 90 | Ht 72.0 in | Wt 191.0 lb

## 2021-07-10 DIAGNOSIS — G4733 Obstructive sleep apnea (adult) (pediatric): Secondary | ICD-10-CM | POA: Diagnosis not present

## 2021-07-10 NOTE — Patient Instructions (Signed)

## 2021-07-10 NOTE — Progress Notes (Signed)
?SLEEP MEDICINE CLINIC ? ? ?Provider:  Melvyn Novas, M D  ?Referring Provider: [name], NP ?Primary Care Physician:  [name], NP ? ?Chief Complaint  ?Patient presents with  ? Obstructive Sleep Apnea  ?  Rm 10, alone. Here for initial CPAP f/u. Pt reports doing well on CPAP. No concerns today.   ? ? ?07-10-2021: RV with new CPAP, after HST confirmed apnea.  ?This HST confirms the presence of moderate sleep apnea that did not vary much between REM and non-REM sleep and was only significant in prone sleep or supine sleep position. ?  ?The apnea-hypopnea index was 18.9/h and during REM sleep slightly lower than in non-REM sleep. ?Non-REM sleep AHI was 19.6 and REM sleep AHI 16.1/h.                                       ?Positional AHI: A supine AHI was seen at 16.4/h in prone sleep which the patient seems to prefer the AHI was 29.6/h.  On the right and left side the AHI was under 10.RECOMMENDATION: The patient should avoid prone and supine position as both were associated with higher apnea and disease.  Since he has been a compliant CPAP user at 8 cm water and feels good with his CPAP , I will order a new machine for him - an auto titration device with CPAP function between 5 and 13 cmH2O.  2 cm EPR. ? ?He has remained 100% compliant by days and time of use, residual AHI 1.9/h.  ?Airleak  95% 32.7/h. nasal interface.  ?95%  pressure 11 cm water. ? 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 ?  ? ? ?02-03-2021 Rv with need for a new CPAP machine his is 61 years old.  ?He is highly compliant and has noted a sound that is concerning.  ?Residual AHI is 1.6/h. ?Last sleep study was 2015, I will order a retest by HST.  ? ? ?12-26-2018, 61 year old caucasian , right handed Male patient ,James [name]. ?situational depressed.  GDS 12/ 15 point. ?James [name] has is usually has high  compliance with CPAP use with an average of 93% equal to 7 hours and 45 minutes at night.  CPAP is an air sense 10 AutoSet currently with a setting of 8 cm water pressure to centimeter EPR and a residual AHI of 1.4 which is an excellent resolution there are some airleak data however that seemed not to influence the apnea index.  I think the high current endorsement of the geriatric depression scale is more of a reactive situational score the fatigue severity is endorsed at 12 out of 61 years and would be much slightly higher if a true clinical depression would be present.  Epworth sleepiness course endorsed at 1 out of 24 points. ? ? ? ?Follow up for this 61 year old caucasian male patient on CPAP.  ?12-21-2017, I have the pleasure of meeting James [name] today for his yearly CPAP compliance visit.  He has used a CPAP 80% for the last month but has been a gap of 3 days which is highly unusual and his average user time on days used is 7 hours and 45 minutes, he has a very low residual  AHI of 1.4/h of sleep, set at 8 cmH2O was to submit a EPR, there are some high air leaks.  I have always felt that these were related to facial hair but he is using a nasal pillow. ? ?12-16-2016 He reports from his travel adventure in [place]. James [name] just returned from a trip to [place], [name], with an average user time currently on days used of 8 hours and 40 minutes. He used to machine 18 out of 30 days again this was during his travel time- he used his old CPAP as his travel CPAP ( OSA treated since 2002). CPAP is set at 8:30 meter water with 3 cm EPR residual AHI is only 1.8 there are some higher air leaks probably attributable to his facial hair. That needs to be no change in settings. Epworth sleepiness score is 2 points fatigue severity 12 points also no concern from the side. I would like for him to continue using CPAP nightly.    ? ? ?Referral from Dr. Janyth Contes for a sleep consultation. ?James [name] is a right-handed Caucasian gentleman who underwent a sleep study on 08-23-09 at [facility],  which documented an AHI of 22.3. His primary care physician had referred him after her the patient was to hold during a camping trip that fellow travelers had noticed him to snore loudly and have frequent apneas. He also has severe restless legs. He returned for a titration study on 11-22-2009 and a respironics nasal pillow in  medium size was used to. The patient was titrated to 8 cm water, at which pressure his AHI current of 0.0. He was able to sleep under 10 minutes under the pressure. Some snoring was still noted but to a much lower volume.  ?Bisacodyl has been compliant he using her CPAP ever since and for the first time today I see her download.  ?He had been lost in followup also he tried to make an appointment with our sleep clinic.  ?The CPAP download dated 11-05-13 encompassed  90 days,  an average daily user time of 7 hours and 39 minutes , ?100% compliance of 4 hours. The machine is set at 8 cm of water without EPR and his residual AHI is 1.6. ?In spite of being on CPAP, he cannot sleep in the same bed with his partner, reporting RLS.  ?It seems, he has not RLS, but PLM s, bothering his partner.  ?The patient works at night, goes to bed at 1.30 AM and once he puts his CPAP on, he is promptly asleep. He never needs an alarm clock to rise. ?Sleeps from 1.45 to 9.30 and mainly uninterrputed, only one nocturia.  Work starts at 2.30 PM.  ?He drinks sweet iced tea, mainly water.  ? 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 He is 100% compliant with the use of CPAP over 4 hours nightly on a 30 day download obtained in office today. Average user time is 7 hours 59 minutes set pressure is 8 cm water without EPR and AHI is 1.9. He does have some air leaks but this is related to  his facial hair. Always some Epworth sleepiness score is endorsed at 0. Fatigue severity is endorsed at 0. The patient's partner sleeps in a different room actually on a different floor of the house. Was begun but the patient was still  snoring and kicking a lot at night, so he is not able to bear witness to how the sleep habits have changed. ?A she uses a 61 year old [facility] machine and he would like to have a more trouble friendly option available lighter and perhaps quieter. The patient has noticed that the machine is noisier than it used to be. ?He would also like to obtain a car [employer] to be able to camp and uses CPAP machine. In addition he travels long distances and would like to have a CPAP machine that would be usable on a  Plane. 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 ? ?Interval history 12/23/2015, ?The pleasure of seeing Mr.  Carroll KindsScotty [name], today for a CPAP compliance visit his residual AHI is 1.78 cm water pressure without EPR and he has used the machine 100% of the last 30 days and each of those nights over 4 hours. His average user time is 8 hours and 7 minutes. No adjustments have to be made. He does have some higher air leaks but he is not bothered by it. He does like to use a nasal pillow. His Epworth sleepiness score is endorsed at 0, ? ?The patient has a history of childhood asthma and was a smoker for many  ( 20 ?) years. Quit in 2001.   ?Review of Systems: ?Out of a complete 14 system review, the patient complains of only the following symptoms, and all other reviewed systems are negative. ? tinnitus in the left, some fatigue related to shift work.  ? ?How likely are you to doze in the following situations: ?0 = not likely, 1 = slight chance, 2 = moderate chance, 3 = high chance ? ?Sitting and Reading? ?Watching Television? ?Sitting inactive in a public place (theater or meeting)? ?Lying down in the afternoon when  circumstances permit? ?Sitting and talking to someone? ?Sitting quietly after lunch without alcohol? ?In a car, while stopped for a few minutes in traffic? ?As a passenger in a car for an hour without a break? ?

## 2021-07-11 DIAGNOSIS — M48061 Spinal stenosis, lumbar region without neurogenic claudication: Secondary | ICD-10-CM | POA: Insufficient documentation

## 2021-07-11 NOTE — Progress Notes (Signed)
? ? ?Careteam: ?Patient Care Team: ?[name], NP as PCP - General (Geriatric Medicine) ? ?PLACE OF SERVICE:  ?[facility]  ?Advanced Directive information ?Does Patient Have a Medical Advance Directive?: Yes, Would patient like information on creating a medical advance directive?: No - Patient declined, Type of Advance Directive: Healthcare Power of [place];Living will;Out of facility DNR (pink MOST or yellow form), Does patient want to make changes to medical advance directive?: No - Patient declined ? ?Allergies  ?Allergen Reactions  ? Bee Venom Anaphylaxis  ? Codeine Other (See Comments)  ? Pyridoxine Other (See Comments)  ?  Made him crazy  ? Skelaxin [Metaxalone] Other (See Comments)  ? Augmentin [Amoxicillin-Pot Clavulanate] Nausea And Vomiting  ? Sedapap [Butalbital-Acetaminophen] Other (See Comments)  ? Yellow Jacket Venom Other (See Comments)  ? ? ?Chief Complaint  ?Patient presents with  ? Medical Management of Chronic Issues  ?  3 month follow up.  ? Health Maintenance  ?  Discuss the need for Colonoscopy.   ? ? ? ?HPI: Patient is a 61 y.o. male for follow up on anxiety.  ?He has increased cymbalta to 60 mg daily  ?Doing yoga by himself.  ? ?He has not taking Lorazpam since June 20, 2021.  ?He is having vivid dreams. He did not had dreams on lorazepam  ?He is sticking to a strict schedule.  ?Feels encouraged.  ?Does not feel like he will use again.  ?Cymbalta is doing well without side effects.  ? ?Using CPAP- very complaint ?Using prayer and mediatation.  ? ? ?Gets plenty of protein and vegetables daily.  ?Taking vit b supplements.  ? ?Review of Systems:  ?Review of Systems  ?Constitutional:  Negative for chills, fever and weight loss.  ?HENT:  Negative for tinnitus.   ?Respiratory:  Negative for cough, sputum production and shortness of breath.   ?Cardiovascular:  Negative for chest pain, palpitations and leg swelling.  ?Gastrointestinal:  Negative for abdominal pain, constipation, diarrhea  and heartburn.  ?Genitourinary:  Negative for dysuria, frequency and urgency.  ?Musculoskeletal:  Positive for back pain and myalgias. Negative for falls and joint pain.  ?Skin: Negative.   ?Neurological:  Negative for dizziness and headaches.  ?Psychiatric/Behavioral:  Negative for depression and memory loss. The patient does not have insomnia.   ? ?Past Medical History:  ?Diagnosis Date  ? Anxiety   ? Asthma   ? BPH (benign prostatic hyperplasia)   ? Cervicalgia   ? degenerative changes and foraminal stenoses  ? Chest pain, unspecified   ? Depression   ? Family history of colonic polyps   ? Father with benign colon polyp  ? Headache, tension type, chronic 01/2012  ? Hepatitis B antibody positive 1984  ? Hiatal hernia 09/19/10  ? Hyperglycemia   ? 104 on 10/10/10  ? Hyperlipidemia   ? Obstructive sleep apnea (adult) (pediatric)   ? using CPAP  ? Other malaise and fatigue   ? Supraventricular premature beats   ? noted on EKG 05/18/12  ? Wrist pain, left   ? ?Past Surgical History:  ?Procedure Laterality Date  ? COLON SURGERY  09/17/2008  ? poylps/hemorriods  ? WISDOM TOOTH EXTRACTION  03/2009  ? ?Social History: ?  reports that he has quit smoking. His smoking use included cigarettes. He has never used smokeless tobacco. He reports that he does not currently use alcohol. He reports that he does not use drugs. ? ?Family History  ?Problem Relation Age of Onset  ? Heart  disease Mother   ? Hypertension Mother   ? Diabetes Mother   ? Depression Mother   ? Cancer Father   ?     2004 bladder cancer  ? Alcohol abuse Father   ? ? ?Medications: ?Patient's Medications  ?New Prescriptions  ? No medications on file  ?Previous Medications  ? ALBUTEROL (VENTOLIN HFA) 108 (90 BASE) MCG/ACT INHALER    Inhale 2 puffs into the lungs every 6 (six) hours as needed for wheezing or shortness of breath.  ? ASPIRIN EC 81 MG TABLET    Take 81 mg by mouth every other day.  ? CHOLECALCIFEROL (VITAMIN D3) 25 MCG (1000 UT) TABLET    Take 1,000 Units  by mouth daily.  ? DICLOFENAC (VOLTAREN) 75 MG EC TABLET    Take 75 mg by mouth 2 (two) times daily.  ? DULOXETINE (CYMBALTA) 60 MG CAPSULE    Take 1 capsule (60 mg total) by mouth daily.  ? EPINEPHRINE 0.3 MG/0.3 ML IJ SOAJ INJECTION    Inject 0.23ml into the muscle as needed for allergic reaction  ? HYDROCODONE-ACETAMINOPHEN (NORCO) 7.5-325 MG TABLET    Take 1 tablet by mouth every 4 (four) hours as needed for moderate pain or severe pain.  ? LATANOPROST (XALATAN) 0.005 % OPHTHALMIC SOLUTION    1 drop at bedtime.  ? LORAZEPAM (ATIVAN) 0.5 MG TABLET    Take 0.5 tablets (0.25 mg total) by mouth at bedtime. for anxiety  ? MONTELUKAST (SINGULAIR) 10 MG TABLET    Take 10 mg by mouth as needed.  ? MULTIPLE VITAMIN (MULTIVITAMIN) TABLET    Take 1 tablet by mouth daily.  ? SILDENAFIL (REVATIO) 20 MG TABLET    Take 2-5 tablets prior to intercourse  ? ZINC GLUCONATE 50 MG TABLET    Take 50 mg by mouth daily.  ?Modified Medications  ? No medications on file  ?Discontinued Medications  ? HYDROCODONE-ACETAMINOPHEN (VICODIN PO)    Take 1 tablet by mouth as needed.  ? ? ?Physical Exam: ? ?Vitals:  ? 07/14/21 1053  ?BP: 124/84  ?Pulse: 73  ?Resp: 16  ?Temp: 97.8 ?F (36.6 ?C)  ?SpO2: 97%  ?Weight: 193 lb 9.6 oz (87.8 kg)  ?Height: 6' (1.829 m)  ? ?Body mass index is 26.26 kg/m?. ?Wt Readings from Last 3 Encounters:  ?07/14/21 193 lb 9.6 oz (87.8 kg)  ?07/10/21 191 lb (86.6 kg)  ?04/14/21 191 lb (86.6 kg)  ? ? ?Physical Exam ?Constitutional:   ?   General: He is not in acute distress. ?   Appearance: He is well-developed. He is not diaphoretic.  ?HENT:  ?   Head: Normocephalic and atraumatic.  ?   Right Ear: External ear normal.  ?   Left Ear: External ear normal.  ?   Mouth/Throat:  ?   Pharynx: No oropharyngeal exudate.  ?Eyes:  ?   Conjunctiva/sclera: Conjunctivae normal.  ?   Pupils: Pupils are equal, round, and reactive to light.  ?Cardiovascular:  ?   Rate and Rhythm: Normal rate and regular rhythm.  ?   Heart sounds: Normal  heart sounds.  ?Pulmonary:  ?   Effort: Pulmonary effort is normal.  ?   Breath sounds: Normal breath sounds.  ?Abdominal:  ?   General: Bowel sounds are normal.  ?   Palpations: Abdomen is soft.  ?Musculoskeletal:     ?   General: No tenderness.  ?   Cervical back: Normal range of motion and neck supple.  ?  Right lower leg: No edema.  ?   Left lower leg: No edema.  ?Skin: ?   General: Skin is warm and dry.  ?Neurological:  ?   Mental Status: He is alert and oriented to person, place, and time.  ? ? ?Labs reviewed: ?Basic Metabolic Panel: ?Recent Labs  ?  10/25/20 ?1111 04/14/21 ?1108  ?NA 139 140  ?K 4.5 4.5  ?CL 107 105  ?CO2 25 28  ?GLUCOSE 94 99  ?BUN 15 18  ?CREATININE 0.85 0.85  ?CALCIUM 9.2 9.9  ? ?Liver Function Tests: ?Recent Labs  ?  10/25/20 ?1111 04/14/21 ?1108  ?AST 15 19  ?ALT 14 15  ?BILITOT 0.5 0.4  ?PROT 6.4 6.7  ? ?No results for input(s): LIPASE, AMYLASE in the last 8760 hours. ?No results for input(s): AMMONIA in the last 8760 hours. ?CBC: ?Recent Labs  ?  10/25/20 ?1111 04/14/21 ?1108  ?WBC 4.8 5.8  ?NEUTROABS 2,837 3,184  ?HGB 13.3 13.8  ?HCT 41.1 41.6  ?MCV 86.5 85.2  ?PLT 203 209  ? ?Lipid Panel: ?Recent Labs  ?  10/25/20 ?1111  ?CHOL 219*  ?HDL 81  ?LDLCALC 121*  ?TRIG 73  ?CHOLHDL 2.7  ? ?TSH: ?No results for input(s): TSH in the last 8760 hours. ?A1C: ?Lab Results  ?Component Value Date  ? HGBA1C 5.5 02/01/2020  ? ? ? ?Assessment/Plan ?1. Anxiety state ?Completely off lorazepam for ~3 weeks. Continues on Cymbalta 60 mg daily with lifestyle modifications.  ? ?2. Hyperlipidemia, unspecified hyperlipidemia type ?-continues with dietary modifications, will follow up lipids with next labs.  ? ?3. Hyperglycemia ?Stable on recent labs.  ? ?4. Obstructive sleep apnea (adult) (pediatric) ?Continues on cpap ? ?5. Seasonal allergic rhinitis due to pollen ?-ongoing, worse this time of year, continues on singulair daily  ? ?6. Benign prostatic hyperplasia with urinary hesitancy ?-not on medication-  consider flomax in the future if worsens.  ? ?7. Spinal stenosis of lumbar region with radiculopathy ?Stable, continues on hydrocodone/apap PRN  ? ? ?Will follow up in ~8 months for routine follow up, l

## 2021-07-14 ENCOUNTER — Encounter: Payer: Self-pay | Admitting: Nurse Practitioner

## 2021-07-14 ENCOUNTER — Ambulatory Visit (INDEPENDENT_AMBULATORY_CARE_PROVIDER_SITE_OTHER): Payer: 59 | Admitting: Nurse Practitioner

## 2021-07-14 VITALS — BP 124/84 | HR 73 | Temp 97.8°F | Resp 16 | Ht 72.0 in | Wt 193.6 lb

## 2021-07-14 DIAGNOSIS — E785 Hyperlipidemia, unspecified: Secondary | ICD-10-CM | POA: Diagnosis not present

## 2021-07-14 DIAGNOSIS — F411 Generalized anxiety disorder: Secondary | ICD-10-CM

## 2021-07-14 DIAGNOSIS — G4733 Obstructive sleep apnea (adult) (pediatric): Secondary | ICD-10-CM

## 2021-07-14 DIAGNOSIS — M48061 Spinal stenosis, lumbar region without neurogenic claudication: Secondary | ICD-10-CM

## 2021-07-14 DIAGNOSIS — R739 Hyperglycemia, unspecified: Secondary | ICD-10-CM | POA: Diagnosis not present

## 2021-07-14 DIAGNOSIS — J301 Allergic rhinitis due to pollen: Secondary | ICD-10-CM

## 2021-07-14 DIAGNOSIS — R3911 Hesitancy of micturition: Secondary | ICD-10-CM

## 2021-07-14 DIAGNOSIS — N401 Enlarged prostate with lower urinary tract symptoms: Secondary | ICD-10-CM

## 2021-07-14 DIAGNOSIS — M5416 Radiculopathy, lumbar region: Secondary | ICD-10-CM

## 2021-08-01 ENCOUNTER — Other Ambulatory Visit: Payer: Self-pay | Admitting: *Deleted

## 2021-08-01 DIAGNOSIS — M542 Cervicalgia: Secondary | ICD-10-CM

## 2021-08-01 MED ORDER — HYDROCODONE-ACETAMINOPHEN 7.5-325 MG PO TABS: 1.0000 | ORAL_TABLET | ORAL | 0 refills | Status: DC | PRN

## 2021-08-01 NOTE — Telephone Encounter (Signed)
Patient requested refill.  ?Epic LR: 07/03/2021 ?Contract Date: 11/04/2020 ?Pended Rx and sent to [facility] for approval.  ?

## 2021-09-01 ENCOUNTER — Other Ambulatory Visit: Payer: Self-pay | Admitting: Nurse Practitioner

## 2021-09-01 ENCOUNTER — Other Ambulatory Visit: Payer: Self-pay | Admitting: *Deleted

## 2021-09-01 DIAGNOSIS — M542 Cervicalgia: Secondary | ICD-10-CM

## 2021-09-01 MED ORDER — HYDROCODONE-ACETAMINOPHEN 7.5-325 MG PO TABS: 1.0000 | ORAL_TABLET | ORAL | 0 refills | Status: DC | PRN

## 2021-09-01 MED ORDER — FLUTICASONE PROPIONATE 50 MCG/ACT NA SUSP
1.0000 | Freq: Every day | NASAL | 1 refills | Status: DC
Start: 2021-09-01 — End: 2021-09-01

## 2021-09-01 NOTE — Telephone Encounter (Signed)
Patient called requesting refill on Pain medication and Flonase.   Stated that he has taken Flonase in the past and it helped a lot for his Allergies and stuffiness. Requesting a refill. Not in Current medication list. Pended for approval.    Hydrocodone LR: 08/01/21 Contract Date: 10/25/2020 Pended Rx and sent to [facility] for approval.

## 2021-09-15 ENCOUNTER — Other Ambulatory Visit: Payer: Self-pay

## 2021-09-15 DIAGNOSIS — M542 Cervicalgia: Secondary | ICD-10-CM

## 2021-09-15 MED ORDER — HYDROCODONE-ACETAMINOPHEN 7.5-325 MG PO TABS: 1.0000 | ORAL_TABLET | ORAL | 0 refills | Status: DC | PRN

## 2021-09-15 NOTE — Telephone Encounter (Signed)
Please call and cancel hydrocodone-apap from [facility] in [place].  Will fill closer to when this is due.

## 2021-09-15 NOTE — Telephone Encounter (Signed)
Rx sent to pharmacy with date to fill for 10/01/21

## 2021-09-30 MED ORDER — HYDROCODONE-ACETAMINOPHEN 7.5-325 MG PO TABS: 1.0000 | ORAL_TABLET | ORAL | 0 refills | Status: DC | PRN

## 2021-09-30 NOTE — Addendum Note (Signed)
Addended by: Nelda Severe A on: 09/30/2021 02:58 PM   Modules accepted: Orders

## 2021-09-30 NOTE — Addendum Note (Signed)
Addended by: Nelda Severe A on: 09/30/2021 01:22 PM   Modules accepted: Orders

## 2021-09-30 NOTE — Telephone Encounter (Signed)
Patient called and stated that he is out of town in [place] and needs a refill on his pain medication.   Jessica instructed him to call closer to the refill date and his last refill was 09/01/2021  Pended Rx and sent to Wyatt Portela, NP for approval.

## 2021-09-30 NOTE — Telephone Encounter (Addendum)
This wasn't refilled 2 weeks ago. It was canceled and Shanda Bumps stated to fill closer to time since Patient was going to be in [place]. She stated we could send the refill there to the [facility] in [place] closer to refill date.     See message from [place] below: [name], NP Nurse Practitioner Internal Medicine Telephone Encounter Signed Creation Time:  09/15/2021  4:44 PM   Signed      Please call and cancel hydrocodone-apap from [facility] in [place].  Will fill closer to when this is due.

## 2021-10-08 ENCOUNTER — Other Ambulatory Visit: Payer: Self-pay | Admitting: Nurse Practitioner

## 2021-10-08 DIAGNOSIS — M5416 Radiculopathy, lumbar region: Secondary | ICD-10-CM

## 2021-10-31 ENCOUNTER — Other Ambulatory Visit: Payer: Self-pay | Admitting: *Deleted

## 2021-10-31 DIAGNOSIS — M542 Cervicalgia: Secondary | ICD-10-CM

## 2021-10-31 NOTE — Telephone Encounter (Signed)
Patient requested refill.  Epic LR: 10/01/2021 Contract Date: 10/25/2020 Note added to upcoming appointment to update.   Pended Rx and sent to [facility] for approval.

## 2021-11-03 MED ORDER — HYDROCODONE-ACETAMINOPHEN 7.5-325 MG PO TABS: 1.0000 | ORAL_TABLET | ORAL | 0 refills | Status: DC | PRN

## 2021-11-03 NOTE — Telephone Encounter (Signed)
Patient called requesting his refill.  

## 2021-11-18 ENCOUNTER — Telehealth (INDEPENDENT_AMBULATORY_CARE_PROVIDER_SITE_OTHER): Payer: 59 | Admitting: Family Medicine

## 2021-11-18 DIAGNOSIS — U071 COVID-19: Secondary | ICD-10-CM | POA: Diagnosis not present

## 2021-11-18 MED ORDER — NIRMATRELVIR/RITONAVIR (PAXLOVID)TABLET: 3.0000 | ORAL_TABLET | Freq: Two times a day (BID) | ORAL | 0 refills | Status: AC

## 2021-11-18 NOTE — Progress Notes (Signed)
Virtual Visit via video  I connected with patient 11/18/21  by video and verified that I am speaking with the correct person using two identifiers.   Location: [place] clinic Patient: home Provider: [facility]    I discussed the limitations, risks, security and privacy concerns of performing an evaluation and management service by telephone and the availability of in person appointments. I also discussed with the patient that there may be a patient responsible charge related to this service. The patient expressed understanding and agreed to proceed.    I discussed the assessment and treatment plan with the patient. The patient was provided an opportunity to ask questions and all were answered. The patient agreed with the plan and demonstrated an understanding of the instructions.     The patient was advised to call back or seek an in-person evaluation if the symptoms worsen or if the condition fails to improve as anticipated.   I provided 15 minutes of non-face-to-face time during this encounter     Provider:  Alain Honey, MD  Careteam: Patient Care Team: [name], NP as PCP - General (Geriatric Medicine)  PLACE OF SERVICE:  [place]  Advanced Directive information    Allergies  Allergen Reactions   Bee Venom Anaphylaxis   Codeine Other (See Comments)   Pyridoxine Other (See Comments)    Made him crazy   Skelaxin [Metaxalone] Other (See Comments)   Augmentin [Amoxicillin-Pot Clavulanate] Nausea And Vomiting   Sedapap [Butalbital-Acetaminophen] Other (See Comments)   Yellow Jacket Venom Other (See Comments)    Chief Complaint  Patient presents with   Acute Visit    Patient reports testing positive for COVID this morning,11/18/21. He reports the following symptoms: headache, cough, sneezing, 101.0 temperature(yesterday,11/17/21).      HPI: Patient is a 61 y.o. male video visit performed this afternoon.  Patient has tested positive for COVID x2.  Symptoms  include headache cough fever to 101 and some dizziness.  He is fully immunized by history.  He has no known exposure to COVID. Cough is described as dry.  No wheezing or shortness of breath. He does have a history of asthma and uses as needed albuterol.  We discussed use of the antiviral antibiotic such as Paxlovid.  Since he is older than 61 and has a history of lung disease we will err on the side of conservative treatment and begin Paxlovid  Review of Systems:  ROS  Past Medical History:  Diagnosis Date   Anxiety    Asthma    BPH (benign prostatic hyperplasia)    Cervicalgia    degenerative changes and foraminal stenoses   Chest pain, unspecified    Depression    Family history of colonic polyps    Father with benign colon polyp   Headache, tension type, chronic 01/2012   Hepatitis B antibody positive 1984   Hiatal hernia 09/19/10   Hyperglycemia    104 on 10/10/10   Hyperlipidemia    Obstructive sleep apnea (adult) (pediatric)    using CPAP   Other malaise and fatigue    Supraventricular premature beats    noted on EKG 05/18/12   Wrist pain, left    Past Surgical History:  Procedure Laterality Date   COLON SURGERY  09/17/2008   poylps/hemorriods   WISDOM TOOTH EXTRACTION  03/2009   Social History:   reports that he has quit smoking. His smoking use included cigarettes. He has never used smokeless tobacco. He reports that he does not currently  use alcohol. He reports that he does not use drugs.  Family History  Problem Relation Age of Onset   Heart disease Mother    Hypertension Mother    Diabetes Mother    Depression Mother    Cancer Father        2004 bladder cancer   Alcohol abuse Father     Medications: Patient's Medications  New Prescriptions   No medications on file  Previous Medications   ALBUTEROL (VENTOLIN HFA) 108 (90 BASE) MCG/ACT INHALER    Inhale 2 puffs into the lungs every 6 (six) hours as needed for wheezing or shortness of breath.   ASPIRIN EC  81 MG TABLET    Take 81 mg by mouth every other day.   CHOLECALCIFEROL (VITAMIN D3) 25 MCG (1000 UT) TABLET    Take 1,000 Units by mouth daily.   DICLOFENAC (VOLTAREN) 75 MG EC TABLET    Take 75 mg by mouth 2 (two) times daily.   DULOXETINE (CYMBALTA) 60 MG CAPSULE    TAKE 1 CAPSULE(60 MG) BY MOUTH DAILY   EPINEPHRINE 0.3 MG/0.3 ML IJ SOAJ INJECTION    Inject 0.64ml into the muscle as needed for allergic reaction   FLUTICASONE (FLONASE) 50 MCG/ACT NASAL SPRAY    SHAKE LIQUID AND USE 1 SPRAY IN EACH NOSTRIL DAILY   HYDROCODONE-ACETAMINOPHEN (NORCO) 7.5-325 MG TABLET    Take 1 tablet by mouth every 4 (four) hours as needed for moderate pain or severe pain.   LATANOPROST (XALATAN) 0.005 % OPHTHALMIC SOLUTION    1 drop at bedtime.   MONTELUKAST (SINGULAIR) 10 MG TABLET    Take 10 mg by mouth as needed.   MULTIPLE VITAMIN (MULTIVITAMIN) TABLET    Take 1 tablet by mouth daily.   SILDENAFIL (REVATIO) 20 MG TABLET    Take 2-5 tablets prior to intercourse   ZINC GLUCONATE 50 MG TABLET    Take 50 mg by mouth daily.  Modified Medications   No medications on file  Discontinued Medications   No medications on file    Physical Exam:  There were no vitals filed for this visit. There is no height or weight on file to calculate BMI. Wt Readings from Last 3 Encounters:  07/14/21 193 lb 9.6 oz (87.8 kg)  07/10/21 191 lb (86.6 kg)  04/14/21 191 lb (86.6 kg)    Physical Exam  Labs reviewed: Basic Metabolic Panel: Recent Labs    04/14/21 1108  NA 140  K 4.5  CL 105  CO2 28  GLUCOSE 99  BUN 18  CREATININE 0.85  CALCIUM 9.9   Liver Function Tests: Recent Labs    04/14/21 1108  AST 19  ALT 15  BILITOT 0.4  PROT 6.7   No results for input(s): "LIPASE", "AMYLASE" in the last 8760 hours. No results for input(s): "AMMONIA" in the last 8760 hours. CBC: Recent Labs    04/14/21 1108  WBC 5.8  NEUTROABS 3,184  HGB 13.8  HCT 41.6  MCV 85.2  PLT 209   Lipid Panel: No results for  input(s): "CHOL", "HDL", "LDLCALC", "TRIG", "CHOLHDL", "LDLDIRECT" in the last 8760 hours. TSH: No results for input(s): "TSH" in the last 8760 hours. A1C: Lab Results  Component Value Date   HGBA1C 5.5 02/01/2020     Assessment/Plan  1. COVID Treated with Paxlovid.  Recommend quarantine till has been without fever for least 24 hours.  Complete 5-day course of treat Supportive care plenty of rest fluids ment   Jacalyn Lefevre,  MD [facility] [phone]

## 2021-11-28 ENCOUNTER — Telehealth: Payer: Self-pay | Admitting: *Deleted

## 2021-11-28 NOTE — Telephone Encounter (Signed)
Received Surgery Clearance form from [facility] [phone] patient is having a Left Total Hip Replacement on 02/23/2022  Patient scheduled an appointment for 12/29/2021 with Shanda Bumps.   Placed form in CMA Cabinet under "9"

## 2021-12-02 ENCOUNTER — Other Ambulatory Visit: Payer: Self-pay | Admitting: *Deleted

## 2021-12-02 DIAGNOSIS — M542 Cervicalgia: Secondary | ICD-10-CM

## 2021-12-02 MED ORDER — HYDROCODONE-ACETAMINOPHEN 7.5-325 MG PO TABS: 1.0000 | ORAL_TABLET | ORAL | 0 refills | Status: DC | PRN

## 2021-12-02 NOTE — Telephone Encounter (Signed)
Patient is camping at [facility] for the week and won't be back till next week and needs his Rx refilled.   Requesting [facility]ill to [facility] on [facility]/14  Epic LR: 11/03/2021 Contract Date 10/25/20 Note added to upcoming appointment to update.   Pended Rx and sent to [facility] for approval.

## 2021-12-29 ENCOUNTER — Ambulatory Visit (INDEPENDENT_AMBULATORY_CARE_PROVIDER_SITE_OTHER): Payer: 59 | Admitting: Nurse Practitioner

## 2021-12-29 ENCOUNTER — Encounter: Payer: Self-pay | Admitting: Nurse Practitioner

## 2021-12-29 VITALS — BP 126/88 | HR 74 | Ht 72.0 in | Wt 195.4 lb

## 2021-12-29 DIAGNOSIS — R3911 Hesitancy of micturition: Secondary | ICD-10-CM

## 2021-12-29 DIAGNOSIS — G4733 Obstructive sleep apnea (adult) (pediatric): Secondary | ICD-10-CM

## 2021-12-29 DIAGNOSIS — Z01818 Encounter for other preprocedural examination: Secondary | ICD-10-CM | POA: Diagnosis not present

## 2021-12-29 DIAGNOSIS — N401 Enlarged prostate with lower urinary tract symptoms: Secondary | ICD-10-CM

## 2021-12-29 DIAGNOSIS — M48061 Spinal stenosis, lumbar region without neurogenic claudication: Secondary | ICD-10-CM | POA: Diagnosis not present

## 2021-12-29 DIAGNOSIS — F411 Generalized anxiety disorder: Secondary | ICD-10-CM

## 2021-12-29 DIAGNOSIS — E785 Hyperlipidemia, unspecified: Secondary | ICD-10-CM

## 2021-12-29 DIAGNOSIS — M5416 Radiculopathy, lumbar region: Secondary | ICD-10-CM

## 2021-12-29 NOTE — Progress Notes (Signed)
Careteam: Patient Care Team: James Chandler, NP as PCP - General (Geriatric Medicine)  PLACE OF SERVICE:  [place]  Advanced Directive information    Allergies  Allergen Reactions   Bee Venom Anaphylaxis   Codeine Other (See Comments)   Pyridoxine Other (See Comments)    Made him crazy   Skelaxin [Metaxalone] Other (See Comments)   Augmentin [Amoxicillin-Pot Clavulanate] Nausea And Vomiting   Sedapap [Butalbital-Acetaminophen] Other (See Comments)   Yellow Jacket Venom Other (See Comments)    Chief Complaint  Patient presents with   Surgical Clearance    Patient presents today for surgical clearance for left total hip replacement.     HPI: Patient is a 61 y.o. male for preop visit.  He has been followed by orthopedic due to left hip pain.  He was evaluated by surgery and it was recommended to have a left total hip replacement.  He continues on hydrocodone-apap 7.5/325 mg routinely throughout the day. Discussed use of pain medication and that after surgery that his surgeon will prescribe medication for post op pain, we will need to make an appt with him to resume pain management.    He states he has not been having any issues. No complaints of urinary frequency, no chest pains, shortness of breath, palpitations, headache, visual changes, weakness, swelling.  Doing great and in his usual state of health.   He has had surgery scheduled for Dec 4th, but this may change as he has plans in January so he can go on a trip and wants to make sure he is able to go.    Review of Systems:  Review of Systems  Constitutional:  Negative for chills, fever and weight loss.  HENT:  Negative for tinnitus.   Respiratory:  Negative for cough, sputum production and shortness of breath.   Cardiovascular:  Negative for chest pain, palpitations and leg swelling.  Gastrointestinal:  Negative for abdominal pain, constipation, diarrhea and heartburn.  Genitourinary:  Negative for  dysuria, frequency and urgency.  Musculoskeletal:  Positive for back pain and joint pain. Negative for falls and myalgias.  Skin: Negative.   Neurological:  Negative for dizziness and headaches.  Psychiatric/Behavioral:  Negative for depression and memory loss. The patient is not nervous/anxious and does not have insomnia.     Past Medical History:  Diagnosis Date   Anxiety    Asthma    BPH (benign prostatic hyperplasia)    Cervicalgia    degenerative changes and foraminal stenoses   Chest pain, unspecified    Depression    Family history of colonic polyps    Father with benign colon polyp   Headache, tension type, chronic 01/2012   Hepatitis B antibody positive 1984   Hiatal hernia 09/19/10   Hyperglycemia    104 on 10/10/10   Hyperlipidemia    Obstructive sleep apnea (adult) (pediatric)    using CPAP   Other malaise and fatigue    Supraventricular premature beats    noted on EKG 05/18/12   Wrist pain, left    Past Surgical History:  Procedure Laterality Date   COLON SURGERY  09/17/2008   poylps/hemorriods   WISDOM TOOTH EXTRACTION  03/2009   Social History:   reports that he has quit smoking. His smoking use included cigarettes. He has never used smokeless tobacco. He reports that he does not currently use alcohol. He reports that he does not use drugs.  Family History  Problem Relation Age of Onset  Heart disease Mother    Hypertension Mother    Diabetes Mother    Depression Mother    Cancer Father        2004 bladder cancer   Alcohol abuse Father     Medications: Patient's Medications  New Prescriptions   No medications on file  Previous Medications   ALBUTEROL (VENTOLIN HFA) 108 (90 BASE) MCG/ACT INHALER    Inhale 2 puffs into the lungs every 6 (six) hours as needed for wheezing or shortness of breath.   ASPIRIN EC 81 MG TABLET    Take 81 mg by mouth every other day.   CHOLECALCIFEROL (VITAMIN D3) 25 MCG (1000 UT) TABLET    Take 1,000 Units by mouth daily.    DICLOFENAC (VOLTAREN) 75 MG EC TABLET    Take 75 mg by mouth 2 (two) times daily.   DULOXETINE (CYMBALTA) 60 MG CAPSULE    TAKE 1 CAPSULE(60 MG) BY MOUTH DAILY   EPINEPHRINE 0.3 MG/0.3 ML IJ SOAJ INJECTION    Inject 0.93m into the muscle as needed for allergic reaction   FLUTICASONE (FLONASE) 50 MCG/ACT NASAL SPRAY    SHAKE LIQUID AND USE 1 SPRAY IN EACH NOSTRIL DAILY   HYDROCODONE-ACETAMINOPHEN (NORCO) 7.5-325 MG TABLET    Take 1 tablet by mouth every 4 (four) hours as needed for moderate pain or severe pain.   LATANOPROST (XALATAN) 0.005 % OPHTHALMIC SOLUTION    1 drop at bedtime.   MONTELUKAST (SINGULAIR) 10 MG TABLET    Take 10 mg by mouth as needed.   MULTIPLE VITAMIN (MULTIVITAMIN) TABLET    Take 1 tablet by mouth daily.   SILDENAFIL (REVATIO) 20 MG TABLET    Take 2-5 tablets prior to intercourse   ZINC GLUCONATE 50 MG TABLET    Take 50 mg by mouth daily.  Modified Medications   No medications on file  Discontinued Medications   No medications on file    Physical Exam:  Vitals:   12/29/21 1043  BP: 126/88  Pulse: 74  SpO2: 97%  Weight: 195 lb 6.4 oz (88.6 kg)  Height: 6' (1.829 m)   Body mass index is 26.5 kg/m. Wt Readings from Last 3 Encounters:  12/29/21 195 lb 6.4 oz (88.6 kg)  07/14/21 193 lb 9.6 oz (87.8 kg)  07/10/21 191 lb (86.6 kg)    Physical Exam Constitutional:      General: He is not in acute distress.    Appearance: He is well-developed. He is not diaphoretic.  HENT:     Head: Normocephalic and atraumatic.     Right Ear: External ear normal.     Left Ear: External ear normal.     Mouth/Throat:     Pharynx: No oropharyngeal exudate.  Eyes:     Conjunctiva/sclera: Conjunctivae normal.     Pupils: Pupils are equal, round, and reactive to light.  Cardiovascular:     Rate and Rhythm: Normal rate and regular rhythm.     Heart sounds: Normal heart sounds.  Pulmonary:     Effort: Pulmonary effort is normal.     Breath sounds: Normal breath sounds.   Abdominal:     General: Bowel sounds are normal.     Palpations: Abdomen is soft.  Musculoskeletal:        General: No tenderness.     Cervical back: Normal range of motion and neck supple.     Right lower leg: No edema.     Left lower leg: No edema.  Skin:  General: Skin is warm and dry.  Neurological:     Mental Status: He is alert and oriented to person, place, and time.    Labs reviewed: Basic Metabolic Panel: Recent Labs    04/14/21 1108  NA 140  K 4.5  CL 105  CO2 28  GLUCOSE 99  BUN 18  CREATININE 0.85  CALCIUM 9.9   Liver Function Tests: Recent Labs    04/14/21 1108  AST 19  ALT 15  BILITOT 0.4  PROT 6.7   No results for input(s): "LIPASE", "AMYLASE" in the last 8760 hours. No results for input(s): "AMMONIA" in the last 8760 hours. CBC: Recent Labs    04/14/21 1108  WBC 5.8  NEUTROABS 3,184  HGB 13.8  HCT 41.6  MCV 85.2  PLT 209   Lipid Panel: No results for input(s): "CHOL", "HDL", "LDLCALC", "TRIG", "CHOLHDL", "LDLDIRECT" in the last 8760 hours. TSH: No results for input(s): "TSH" in the last 8760 hours. A1C: Lab Results  Component Value Date   HGBA1C 5.5 02/01/2020     Assessment/Plan 1. Visit for pre-operative examination -pt is stable for left total hip.  - EKG 12-Lead- NSR - CBC with Differential/Platelet - CMP with eGFR([facility]) - HIV antibody (with reflex)  2. Obstructive sleep apnea (adult) (pediatric) -continues on CPAP   3. Spinal stenosis of lumbar region with radiculopathy -continues to chronic pain management. Discussed that after surgery surgeon will take over pain management, question if some of the back pain due to hip. Hopefully after surgery dose reduction can take place.   4. Hyperlipidemia, unspecified hyperlipidemia type -continue dietary modifications.  - Lipid panel  5. Benign prostatic hyperplasia with urinary hesitancy -overall has been very stable. No changes in symptoms.  - PSA   Keep follow up  as scheduled, labs completed today James [name] K. [place], [facility] [phone]

## 2021-12-30 LAB — CBC WITH DIFFERENTIAL/PLATELET
Absolute Monocytes: 481 cells/uL (ref 200–950)
Basophils Absolute: 49 cells/uL (ref 0–200)
Basophils Relative: 0.9 %
Eosinophils Absolute: 373 cells/uL (ref 15–500)
Eosinophils Relative: 6.9 %
HCT: 39.4 % (ref 38.5–50.0)
Hemoglobin: 13.3 g/dL (ref 13.2–17.1)
Lymphs Abs: 1631 cells/uL (ref 850–3900)
MCH: 28.2 pg (ref 27.0–33.0)
MCHC: 33.8 g/dL (ref 32.0–36.0)
MCV: 83.7 fL (ref 80.0–100.0)
MPV: 11.7 fL (ref 7.5–12.5)
Monocytes Relative: 8.9 %
Neutro Abs: 2867 cells/uL (ref 1500–7800)
Neutrophils Relative %: 53.1 %
Platelets: 231 10*3/uL (ref 140–400)
RBC: 4.71 10*6/uL (ref 4.20–5.80)
RDW: 13 % (ref 11.0–15.0)
Total Lymphocyte: 30.2 %
WBC: 5.4 10*3/uL (ref 3.8–10.8)

## 2021-12-30 LAB — COMPLETE METABOLIC PANEL WITH GFR
AG Ratio: 1.4 (calc) (ref 1.0–2.5)
ALT: 12 U/L (ref 9–46)
AST: 18 U/L (ref 10–35)
Albumin: 4.1 g/dL (ref 3.6–5.1)
Alkaline phosphatase (APISO): 64 U/L (ref 35–144)
BUN: 16 mg/dL (ref 7–25)
CO2: 27 mmol/L (ref 20–32)
Calcium: 9.3 mg/dL (ref 8.6–10.3)
Chloride: 105 mmol/L (ref 98–110)
Creat: 0.99 mg/dL (ref 0.70–1.35)
Globulin: 2.9 g/dL (calc) (ref 1.9–3.7)
Glucose, Bld: 94 mg/dL (ref 65–99)
Potassium: 4.5 mmol/L (ref 3.5–5.3)
Sodium: 139 mmol/L (ref 135–146)
Total Bilirubin: 0.5 mg/dL (ref 0.2–1.2)
Total Protein: 7 g/dL (ref 6.1–8.1)
eGFR: 86 mL/min/{1.73_m2} (ref >=60)

## 2021-12-30 LAB — LIPID PANEL
Cholesterol: 222 mg/dL — ABNORMAL HIGH (ref <200)
HDL: 68 mg/dL (ref >=40)
LDL Cholesterol (Calc): 137 mg/dL (calc) — ABNORMAL HIGH
Non-HDL Cholesterol (Calc): 154 mg/dL (calc) — ABNORMAL HIGH (ref <130)
Total CHOL/HDL Ratio: 3.3 (calc) (ref <5.0)
Triglycerides: 78 mg/dL (ref <150)

## 2021-12-30 LAB — HIV ANTIBODY (ROUTINE TESTING W REFLEX): HIV 1&2 Ab, 4th Generation: NONREACTIVE

## 2021-12-30 LAB — PSA: PSA: 1.22 ng/mL (ref <=4.00)

## 2022-01-02 ENCOUNTER — Other Ambulatory Visit: Payer: Self-pay

## 2022-01-02 DIAGNOSIS — M542 Cervicalgia: Secondary | ICD-10-CM

## 2022-01-02 MED ORDER — HYDROCODONE-ACETAMINOPHEN 7.5-325 MG PO TABS: 1.0000 | ORAL_TABLET | ORAL | 0 refills | Status: DC | PRN

## 2022-02-02 ENCOUNTER — Other Ambulatory Visit: Payer: Self-pay

## 2022-02-02 DIAGNOSIS — M542 Cervicalgia: Secondary | ICD-10-CM

## 2022-02-02 MED ORDER — HYDROCODONE-ACETAMINOPHEN 7.5-325 MG PO TABS: 1.0000 | ORAL_TABLET | ORAL | 0 refills | Status: DC | PRN

## 2022-02-02 NOTE — Telephone Encounter (Signed)
Patient called requesting refill on Hydrocodone 7.5/325mg . Medication last refilled 01/02/2022. Patient needs to update contract.   Medication pended and sent to [facility]

## 2022-03-03 ENCOUNTER — Other Ambulatory Visit: Payer: Self-pay | Admitting: *Deleted

## 2022-03-03 DIAGNOSIS — M542 Cervicalgia: Secondary | ICD-10-CM

## 2022-03-03 MED ORDER — HYDROCODONE-ACETAMINOPHEN 7.5-325 MG PO TABS: 1.0000 | ORAL_TABLET | ORAL | 0 refills | Status: DC | PRN

## 2022-03-03 NOTE — Telephone Encounter (Signed)
Patient called and requested refill.  Epic LR: 02/02/2022 Contract needs updated. Added note to upcoming appointment.   Pended Rx and sent to [facility] for approval.

## 2022-04-02 ENCOUNTER — Other Ambulatory Visit: Payer: Self-pay | Admitting: *Deleted

## 2022-04-02 DIAGNOSIS — M542 Cervicalgia: Secondary | ICD-10-CM

## 2022-04-02 MED ORDER — HYDROCODONE-ACETAMINOPHEN 7.5-325 MG PO TABS: 1.0000 | ORAL_TABLET | ORAL | 0 refills | Status: DC | PRN

## 2022-04-02 NOTE — Telephone Encounter (Signed)
Patient called requesting refill.  Epic LR: 03/03/2022 Contract Date: 10/25/2020 note added to upcoming appointment to update.   Pended Rx and sent to [facility] for approval.

## 2022-04-20 ENCOUNTER — Other Ambulatory Visit: Payer: 59

## 2022-04-27 ENCOUNTER — Encounter: Payer: Self-pay | Admitting: Nurse Practitioner

## 2022-04-27 ENCOUNTER — Ambulatory Visit (INDEPENDENT_AMBULATORY_CARE_PROVIDER_SITE_OTHER): Payer: 59 | Admitting: Nurse Practitioner

## 2022-04-27 VITALS — BP 118/72 | HR 75 | Temp 97.2°F | Ht 72.0 in | Wt 198.0 lb

## 2022-04-27 DIAGNOSIS — N401 Enlarged prostate with lower urinary tract symptoms: Secondary | ICD-10-CM

## 2022-04-27 DIAGNOSIS — R3911 Hesitancy of micturition: Secondary | ICD-10-CM

## 2022-04-27 DIAGNOSIS — F411 Generalized anxiety disorder: Secondary | ICD-10-CM | POA: Diagnosis not present

## 2022-04-27 DIAGNOSIS — M5416 Radiculopathy, lumbar region: Secondary | ICD-10-CM

## 2022-04-27 DIAGNOSIS — M48061 Spinal stenosis, lumbar region without neurogenic claudication: Secondary | ICD-10-CM

## 2022-04-27 DIAGNOSIS — E785 Hyperlipidemia, unspecified: Secondary | ICD-10-CM

## 2022-04-27 NOTE — Progress Notes (Signed)
Careteam: James [name] Care Team: [name], NP as PCP - General (Geriatric Medicine)  PLACE OF SERVICE:  [place] Directive information Does James [name] Have a Medical Advance Directive?: No, Would James [name] like information on creating a medical advance directive?: Yes (MAU/Ambulatory/Procedural Areas - Information given)  Allergies  Allergen Reactions   Bee Venom Anaphylaxis   Codeine Other (See Comments)   Pyridoxine Other (See Comments)    Made him crazy   Skelaxin [Metaxalone] Other (See Comments)   Augmentin [Amoxicillin-Pot Clavulanate] Nausea And Vomiting   Sedapap [Butalbital-Acetaminophen] Other (See Comments)   Yellow Jacket Venom Other (See Comments)    Chief Complaint  James [name] presents with   Medical Management of Chronic Issues    4 month follow-up. Per Janett Billow, hold off on updating treatment agreement due to pending surgery next month.  Discuss need for colonoscopy and covid boosters or post pone if James [name] refuses or is not a candidate.      HPI: James [name] is a 62 y.o. male here for routine follow-up.  Normally very active but has been having trouble with his L hip. He has total hip replacement coming up in March- [place].   Using hydrocodone 4-5 times a day. He has been on this chronically for his neck. Not waking up at night to take it. Reports that he will be having surgery at [facility] due to taking chronic pain medication instead of at the outpatient surgical center.  Denies issues with constipation or diarrhea. Gets up about once a night to pee and this has not changed. Denies blood in urine or blood in stool.   Lots of vegetables. Usually cooks at home. Reports he eats fairly healthy.  Mood has been good. Sleeping well. Denies issues with anxiety at this time.  Eye doctor regularly for glaucoma- follow-up tomorrow Dentist- every 6 months   He is doing well, but ready to have hip surgery and get back to an active lifestyle.  Denies  palpitations, chest pain, SOB, numbness, tingling, or swelling.      Review of Systems:  Review of Systems  Constitutional:  Negative for chills, fever, malaise/fatigue and weight loss.  HENT:  Negative for congestion and sore throat.   Eyes:  Negative for blurred vision.  Respiratory:  Negative for cough, shortness of breath and wheezing.   Cardiovascular:  Negative for chest pain, palpitations and leg swelling.  Gastrointestinal:  Negative for abdominal pain, blood in stool, constipation, diarrhea, heartburn, nausea and vomiting.  Genitourinary:  Negative for dysuria, frequency, hematuria and urgency.  Musculoskeletal:  Positive for joint pain. Negative for falls.       L hip pain, chronic  Skin:  Negative for rash.  Neurological:  Negative for dizziness, tingling and headaches.  Endo/Heme/Allergies:  Negative for polydipsia.  Psychiatric/Behavioral:  Negative for depression. The James [name] is not nervous/anxious.     Past Medical History:  Diagnosis Date   Anxiety    Asthma    BPH (benign prostatic hyperplasia)    Cervicalgia    degenerative changes and foraminal stenoses   Chest pain, unspecified    Depression    Family history of colonic polyps    Father with benign colon polyp   Headache, tension type, chronic 01/2012   Hepatitis B antibody positive 1984   Hiatal hernia 09/19/10   Hyperglycemia    104 on 10/10/10   Hyperlipidemia    Obstructive sleep apnea (adult) (pediatric)    using CPAP   Other malaise and  fatigue    Supraventricular premature beats    noted on EKG 05/18/12   Wrist pain, left    Past Surgical History:  Procedure Laterality Date   COLON SURGERY  09/17/2008   poylps/hemorriods   WISDOM TOOTH EXTRACTION  03/2009   Social History:   reports that he has quit smoking. His smoking use included cigarettes. He has never used smokeless tobacco. He reports that he does not currently use alcohol. He reports that he does not use drugs.  Family History   Problem Relation Age of Onset   Heart disease Mother    Hypertension Mother    Diabetes Mother    Depression Mother    Cancer Father        2004 bladder cancer   Alcohol abuse Father     Medications: James [name]'s Medications  New Prescriptions   No medications on file  Previous Medications   ALBUTEROL (VENTOLIN HFA) 108 (90 BASE) MCG/ACT INHALER    Inhale 2 puffs into the lungs every 6 (six) hours as needed for wheezing or shortness of breath.   ASPIRIN EC 81 MG TABLET    Take 81 mg by mouth every other day.   CHOLECALCIFEROL (VITAMIN D3) 25 MCG (1000 UT) TABLET    Take 1,000 Units by mouth daily.   DICLOFENAC (VOLTAREN) 75 MG EC TABLET    Take 75 mg by mouth 2 (two) times daily.   DULOXETINE (CYMBALTA) 60 MG CAPSULE    TAKE 1 CAPSULE(60 MG) BY MOUTH DAILY   EPINEPHRINE 0.3 MG/0.3 ML IJ SOAJ INJECTION    Inject 0.76ml into the muscle as needed for allergic reaction   FLUTICASONE (FLONASE) 50 MCG/ACT NASAL SPRAY    SHAKE LIQUID AND USE 1 SPRAY IN EACH NOSTRIL DAILY   HYDROCODONE-ACETAMINOPHEN (NORCO) 7.5-325 MG TABLET    Take 1 tablet by mouth every 4 (four) hours as needed for moderate pain or severe pain.   LATANOPROST (XALATAN) 0.005 % OPHTHALMIC SOLUTION    Place 1 drop into both eyes daily.   MONTELUKAST (SINGULAIR) 10 MG TABLET    Take 10 mg by mouth as needed.   MULTIPLE VITAMIN (MULTIVITAMIN) TABLET    Take 1 tablet by mouth daily.   SILDENAFIL (REVATIO) 20 MG TABLET    Take 2-5 tablets prior to intercourse   ZINC GLUCONATE 50 MG TABLET    Take 50 mg by mouth daily.  Modified Medications   No medications on file  Discontinued Medications   No medications on file    Physical Exam:  Vitals:   04/27/22 0953  BP: 118/72  Pulse: 75  Temp: (!) 97.2 F (36.2 C)  TempSrc: Temporal  SpO2: 95%  Weight: 198 lb (89.8 kg)  Height: 6' (1.829 m)   Body mass index is 26.85 kg/m. Wt Readings from Last 3 Encounters:  04/27/22 198 lb (89.8 kg)  12/29/21 195 lb 6.4 oz (88.6 kg)   07/14/21 193 lb 9.6 oz (87.8 kg)    Physical Exam Vitals and nursing note reviewed. Exam conducted with a chaperone present.  Cardiovascular:     Rate and Rhythm: Normal rate and regular rhythm.     Heart sounds: Normal heart sounds. No murmur heard. Pulmonary:     Breath sounds: Normal breath sounds. No wheezing or rales.  Abdominal:     General: Bowel sounds are normal.     Palpations: Abdomen is soft. There is no mass.     Tenderness: There is no abdominal tenderness. There is no guarding.  Musculoskeletal:        General: No swelling or tenderness.  Skin:    General: Skin is warm and dry.  Neurological:     Mental Status: He is alert and oriented to person, place, and time. Mental status is at baseline.  Psychiatric:        Mood and Affect: Mood normal.        Behavior: Behavior normal.        Judgment: Judgment normal.     Labs reviewed: Basic Metabolic Panel: Recent Labs    12/29/21 1140  NA 139  K 4.5  CL 105  CO2 27  GLUCOSE 94  BUN 16  CREATININE 0.99  CALCIUM 9.3   Liver Function Tests: Recent Labs    12/29/21 1140  AST 18  ALT 12  BILITOT 0.5  PROT 7.0   No results for input(s): "LIPASE", "AMYLASE" in the last 8760 hours. No results for input(s): "AMMONIA" in the last 8760 hours. CBC: Recent Labs    12/29/21 1140  WBC 5.4  NEUTROABS 2,867  HGB 13.3  HCT 39.4  MCV 83.7  PLT 231   Lipid Panel: Recent Labs    12/29/21 1140  CHOL 222*  HDL 68  LDLCALC 137*  TRIG 78  CHOLHDL 3.3   TSH: No results for input(s): "TSH" in the last 8760 hours. A1C: Lab Results  Component Value Date   HGBA1C 5.5 02/01/2020     Assessment/Plan 1. Hyperlipidemia, unspecified hyperlipidemia type Stable. Continue healthy diet and active lifestyle. - Lipid panel - COMPLETE METABOLIC PANEL WITH GFR - CBC with Differential/Platelet  2. Spinal stenosis of lumbar region with radiculopathy Discussed cutting back on chronic pain medication, especially  prior to hip surgery. He is aware and in agreement to try to taper back. Neck is bothering him much less than the hip at this time. Encouraged use of alternate medications such as tylenol and NSAIDs (with caution).   3. Benign prostatic hyperplasia with urinary hesitancy No issues at this time. Continue to monitor.  4. Anxiety state Denies anxiety at this time. Continues on cymbalta - COMPLETE METABOLIC PANEL WITH GFR   Return in about 4 months (around 08/26/2022) for routine follow up.:  Student- Archer Asa O'Berry ACPCNP-S  I personally was present during the history, physical exam and medical decision-making activities of this service and have verified that the service and findings are accurately documented in the student's note Makana Feigel K. [place], [facility] [phone]

## 2022-04-28 ENCOUNTER — Other Ambulatory Visit: Payer: Self-pay

## 2022-04-28 DIAGNOSIS — D649 Anemia, unspecified: Secondary | ICD-10-CM

## 2022-04-28 LAB — COMPLETE METABOLIC PANEL WITH GFR
AG Ratio: 1.4 (calc) (ref 1.0–2.5)
ALT: 12 U/L (ref 9–46)
AST: 13 U/L (ref 10–35)
Albumin: 3.9 g/dL (ref 3.6–5.1)
Alkaline phosphatase (APISO): 56 U/L (ref 35–144)
BUN: 21 mg/dL (ref 7–25)
CO2: 23 mmol/L (ref 20–32)
Calcium: 8.8 mg/dL (ref 8.6–10.3)
Chloride: 107 mmol/L (ref 98–110)
Creat: 0.95 mg/dL (ref 0.70–1.35)
Globulin: 2.8 g/dL (calc) (ref 1.9–3.7)
Glucose, Bld: 95 mg/dL (ref 65–99)
Potassium: 4.4 mmol/L (ref 3.5–5.3)
Sodium: 141 mmol/L (ref 135–146)
Total Bilirubin: 0.3 mg/dL (ref 0.2–1.2)
Total Protein: 6.7 g/dL (ref 6.1–8.1)
eGFR: 90 mL/min/{1.73_m2} (ref >=60)

## 2022-04-28 LAB — CBC WITH DIFFERENTIAL/PLATELET
Absolute Monocytes: 354 cells/uL (ref 200–950)
Basophils Absolute: 51 cells/uL (ref 0–200)
Basophils Relative: 1.1 %
Eosinophils Absolute: 271 cells/uL (ref 15–500)
Eosinophils Relative: 5.9 %
HCT: 38.1 % — ABNORMAL LOW (ref 38.5–50.0)
Hemoglobin: 12.8 g/dL — ABNORMAL LOW (ref 13.2–17.1)
Lymphs Abs: 1592 cells/uL (ref 850–3900)
MCH: 27.5 pg (ref 27.0–33.0)
MCHC: 33.6 g/dL (ref 32.0–36.0)
MCV: 81.8 fL (ref 80.0–100.0)
MPV: 11.4 fL (ref 7.5–12.5)
Monocytes Relative: 7.7 %
Neutro Abs: 2332 cells/uL (ref 1500–7800)
Neutrophils Relative %: 50.7 %
Platelets: 245 10*3/uL (ref 140–400)
RBC: 4.66 10*6/uL (ref 4.20–5.80)
RDW: 13.4 % (ref 11.0–15.0)
Total Lymphocyte: 34.6 %
WBC: 4.6 10*3/uL (ref 3.8–10.8)

## 2022-04-28 LAB — LIPID PANEL
Cholesterol: 202 mg/dL — ABNORMAL HIGH (ref <200)
HDL: 58 mg/dL (ref >=40)
LDL Cholesterol (Calc): 122 mg/dL (calc) — ABNORMAL HIGH
Non-HDL Cholesterol (Calc): 144 mg/dL (calc) — ABNORMAL HIGH (ref <130)
Total CHOL/HDL Ratio: 3.5 (calc) (ref <5.0)
Triglycerides: 113 mg/dL (ref <150)

## 2022-05-04 ENCOUNTER — Other Ambulatory Visit: Payer: Self-pay

## 2022-05-04 DIAGNOSIS — M542 Cervicalgia: Secondary | ICD-10-CM

## 2022-05-04 MED ORDER — HYDROCODONE-ACETAMINOPHEN 7.5-325 MG PO TABS: 1.0000 | ORAL_TABLET | ORAL | 0 refills | Status: DC | PRN

## 2022-05-04 NOTE — Telephone Encounter (Signed)
Patient is requesting a refill of the following medications: Requested Prescriptions   Pending Prescriptions Disp Refills   HYDROcodone-acetaminophen (NORCO) 7.5-325 MG tablet 180 tablet 0    Sig: Take 1 tablet by mouth every 4 (four) hours as needed for moderate pain or severe pain.    Date of last refill: 04/02/2022  Refill amount: 180  Treatment agreement date: outdated, however provider stated to hold off on updating treatment agreement at last appointment on 04/27/2022 as patient has an upcoming surgery (next month)

## 2022-05-05 NOTE — H&P (Signed)
TOTAL HIP ADMISSION H&P  Patient is admitted for left total hip arthroplasty.  Subjective:  Chief Complaint: Left hip pain  HPI: James [name], 62 y.o. male, has a history of pain and functional disability in the left hip due to arthritis and patient has failed non-surgical conservative treatments for greater than 12 weeks to include use of assistive devices and activity modification. Onset of symptoms was gradual, starting 3 years ago with gradually worsening course since that time. The patient noted no past surgery on the left hip. Patient currently rates pain in the left hip at 8 out of 10 with activity. Patient has worsening of pain with activity and weight bearing, pain that interfers with activities of daily living, and pain with passive range of motion. Patient has evidence of periarticular osteophytes and joint space narrowing by imaging studies. This condition presents safety issues increasing the risk of falls. There is no current active infection.  Patient Active Problem List   Diagnosis Date Noted   Spinal stenosis of lumbar region with radiculopathy 07/11/2021   HIV exposure 04/23/2016   Erectile dysfunction 04/23/2016   Right thyroid nodule 03/12/2016   Sensorineural hearing loss (SNHL) of both ears 03/12/2016   Hemorrhoid 02/06/2015   Allergic rhinitis due to pollen 12/19/2014   Hypertrophy of nasal turbinates 12/19/2014   Nasal cavity polyp 12/19/2014   Deflected nasal septum 12/19/2014   Anxiety state 06/20/2014   Hyperlipidemia    BPH (benign prostatic hyperplasia)    Cervicalgia    Fatigue    Hyperglycemia    Obstructive sleep apnea (adult) (pediatric)    Supraventricular premature beats    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    Past Medical History:  Diagnosis Date   Anxiety    Asthma    BPH (benign prostatic hyperplasia)    Cervicalgia    degenerative changes and foraminal stenoses   Chest pain, unspecified    Depression    Family history of  colonic polyps    Father with benign colon polyp   Headache, tension type, chronic 01/2012   Hepatitis B antibody positive 1984   Hiatal hernia 09/19/10   Hyperglycemia    104 on 10/10/10   Hyperlipidemia    Obstructive sleep apnea (adult) (pediatric)    using CPAP   Other malaise and fatigue    Supraventricular premature beats    noted on EKG 05/18/12   Wrist pain, left     Past Surgical History:  Procedure Laterality Date   COLON SURGERY  09/17/2008   poylps/hemorriods   WISDOM TOOTH EXTRACTION  03/2009    Prior to Admission medications   Medication Sig Start Date End Date Taking? Authorizing Provider  albuterol (VENTOLIN HFA) 108 (90 Base) MCG/ACT inhaler Inhale 2 puffs into the lungs every 6 (six) hours as needed for wheezing or shortness of breath. 02/01/20   Reed, Tiffany L, DO  aspirin EC 81 MG tablet Take 81 mg by mouth every other day.    [provider]  cholecalciferol (VITAMIN D3) 25 MCG (1000 UT) tablet Take 1,000 Units by mouth daily.    [provider]  diclofenac (VOLTAREN) 75 MG EC tablet Take 75 mg by mouth 2 (two) times daily. Patient not taking: Reported on 04/27/2022    [provider]  DULoxetine (CYMBALTA) 60 MG capsule TAKE 1 CAPSULE(60 MG) BY MOUTH DAILY 10/08/21   [name], NP  EPINEPHrine 0.3 mg/0.3 mL IJ SOAJ injection Inject 0.42m into the muscle as needed for allergic  reaction 12/23/20   [name], NP  fluticasone (FLONASE) 50 MCG/ACT nasal spray SHAKE LIQUID AND USE 1 SPRAY IN EACH NOSTRIL DAILY 09/01/21   [name], NP  HYDROcodone-acetaminophen (NORCO) 7.5-325 MG tablet Take 1 tablet by mouth every 4 (four) hours as needed for moderate pain or severe pain. 05/04/22   [name], NP  latanoprost (XALATAN) 0.005 % ophthalmic solution Place 1 drop into both eyes daily.    [provider]  montelukast (SINGULAIR) 10 MG tablet Take 10 mg by mouth as needed.    [provider]   Multiple Vitamin (MULTIVITAMIN) tablet Take 1 tablet by mouth daily.    [provider]  sildenafil (REVATIO) 20 MG tablet Take 2-5 tablets prior to intercourse 05/19/16   Estill Dooms, MD  zinc gluconate 50 MG tablet Take 50 mg by mouth daily.    [provider]    Allergies  Allergen Reactions   Bee Venom Anaphylaxis   Codeine Other (See Comments)   Pyridoxine Other (See Comments)    Made him crazy   Skelaxin [Metaxalone] Other (See Comments)   Augmentin [Amoxicillin-Pot Clavulanate] Nausea And Vomiting   Sedapap [Butalbital-Acetaminophen] Other (See Comments)   Yellow Jacket Venom Other (See Comments)    Social History   Socioeconomic History   Marital status: Married    Spouse name: Rosey Bath   Number of children: 0   Years of education: 13   Highest education level: Not on file  Occupational History   Occupation: Armed forces operational officer: [employer]  Tobacco Use   Smoking status: Former    Types: Cigarettes   Smokeless tobacco: Never   Tobacco comments:    Quit 25 years ago as of 2024  Vaping Use   Vaping Use: Never used  Substance and Sexual Activity   Alcohol use: Not Currently   Drug use: No   Sexual activity: Not on file  Other Topics Concern   Not on file  Social History Narrative   Patient is married (Richard V. Sharlene Motts) and lives at home with his husband.   Patient has a college education.   Patient works full-time ([employer]).   Patient is right-handed.   Patient drinks one cup of coffee daily.   Social Determinants of Health   Financial Resource Strain: Not on file  Food Insecurity: Not on file  Transportation Needs: Not on file  Physical Activity: Not on file  Stress: Not on file  Social Connections: Not on file  Intimate Partner Violence: Not on file    Tobacco Use: Medium Risk (04/27/2022)   Patient History    Smoking Tobacco Use: Former    Smokeless Tobacco Use: Never    Passive Exposure: Not on file    Social History   Substance and Sexual Activity  Alcohol Use Not Currently    Family History  Problem Relation Age of Onset   Heart disease Mother    Hypertension Mother    Diabetes Mother    Depression Mother    Cancer Father        2004 bladder cancer   Alcohol abuse Father     ROS   Objective:  Physical Exam: The patient is a pleasant, well-developed male alert and oriented in no apparent distress.    The patient has an antalgic gait pattern favoring the left side without the use of assistive devices.    Right Hip Exam:  The range of motion:  normal without discomfort.    Left Hip Exam:  The range of motion: Flexion to 100 degrees, Internal Rotation to 0 degrees, External Rotation to 10 to 20 degrees, and abduction to 20 degrees without discomfort.  There is no tenderness over the greater trochanteric bursa.    The patient's sensation and motor function are intact in their lower extremities. Their distal pulses are 2+. The bilateral calves are soft and non-tender.  RESULTS  - AP pelvis, AP and lateral of the bilateral hips dated 02/2022 demonstrate bone-on-bone arthritis in the left hip with subchondral cystic formation. Right hip looks normal.  Assessment/Plan:  End stage arthritis, left hip  The patient history, physical examination, clinical judgement of the provider and imaging studies are consistent with end stage degenerative joint disease of the left hip and total hip arthroplasty is deemed medically necessary. The treatment options including medical management, injection therapy, arthroscopy and arthroplasty were discussed at length. The risks and benefits of total hip arthroplasty were presented and reviewed. The risks due to aseptic loosening, infection, stiffness, dislocation/subluxation, thromboembolic complications and other imponderables were discussed. The patient acknowledged the explanation, agreed to proceed with the plan and consent was signed.  Patient is being admitted for inpatient treatment for surgery, pain control, PT, OT, prophylactic antibiotics, VTE prophylaxis, progressive ambulation and ADLs and discharge planning.The patient is planning to be discharged home with HEP.   Patient's anticipated LOS is less than 2 midnights, meeting these requirements: - Younger than 52 - Lives within 1 hour of care - Has a competent adult at home to recover with post-op recover - NO history of  - Chronic pain requiring opiods  - Diabetes  - Coronary Artery Disease  - Heart failure  - Heart attack  - Stroke  - DVT/VTE  - Cardiac arrhythmia  - Respiratory Failure/COPD  - Renal failure  - Anemia  - Advanced Liver disease    Therapy Plans: HEP Disposition: Home with husband Planned DVT Prophylaxis: Aspirin DME Needed: RW through [facility] [place] PCP: Sherrie Mustache, NP - clearance received TXA: IV Allergies: bee venom Anesthesia Concerns: None BMI: 25.1 Last HgbA1c: N/a Pharmacy: [facility] in [place]  Other: -Currently taking hydrocodone 7.30m 3-4x/ day for neck and back -Oxycodone during hospital stay -Patient's husband is hard of hearing and communicates via sign language   - Patient was instructed on what medications to stop prior to surgery. - Follow-up visit in 2 weeks with Dr. AWynelle Link- Begin physical therapy following surgery - Pre-operative lab work as pre-surgical testing - Prescriptions will be provided in hospital at time of discharge  SShearon Balo PA-C Orthopedic Surgery [facility] [place]

## 2022-05-13 ENCOUNTER — Other Ambulatory Visit: Payer: 59

## 2022-05-13 ENCOUNTER — Encounter: Payer: Self-pay | Admitting: Nurse Practitioner

## 2022-05-13 DIAGNOSIS — D649 Anemia, unspecified: Secondary | ICD-10-CM

## 2022-05-13 LAB — CBC
HCT: 39.5 % (ref 38.5–50.0)
Hemoglobin: 13.3 g/dL (ref 13.2–17.1)
MCH: 27.8 pg (ref 27.0–33.0)
MCHC: 33.7 g/dL (ref 32.0–36.0)
MCV: 82.6 fL (ref 80.0–100.0)
MPV: 11.1 fL (ref 7.5–12.5)
Platelets: 238 10*3/uL (ref 140–400)
RBC: 4.78 10*6/uL (ref 4.20–5.80)
RDW: 13.3 % (ref 11.0–15.0)
WBC: 5.2 10*3/uL (ref 3.8–10.8)

## 2022-05-27 NOTE — Progress Notes (Addendum)
Anesthesia Review:  PCP: Abbey Chatters, NP LOV 04/27/22  clearance on chart dated 03/10/22. OV 01/18/22 on chart  Cardiologist : none  Chest x-ray : EKG : 12/29/21  Echo : Stress test: Cardiac Cath :  Activity level: can do a flignt of stairs without difficutly  Sleep Study/ CPAP : has cpap - to bring mask , tubing and machine per pt preference on DOS  Fasting Blood Sugar :      / Checks Blood Sugar -- times a day:   Blood Thinner/ Instructions /Last Dose: ASA / Instructions/ Last Dose :    Cbc- 05/13/22 - epic    PT diagnosed with sinus infeciton on 05/30/22.  Pt at preop on 06/01/2022 states much improved.  Placed on amoxicillin and prednisone by  DR Vira Blanco- note in epic.

## 2022-05-29 NOTE — Patient Instructions (Signed)
SURGICAL WAITING ROOM VISITATION  Patients having surgery or a procedure may have no more than 2 support people in the waiting area - these visitors may rotate.    Children under the age of 18 must have an adult with them who is not the patient.  Due to an increase in RSV and influenza rates and associated hospitalizations, children ages 36 and under may not visit patients in [place].  If the patient needs to stay at the hospital during part of their recovery, the visitor guidelines for inpatient rooms apply. Pre-op nurse will coordinate an appropriate time for 1 support person to accompany patient in pre-op.  This support person may not rotate.    Please refer to the [facility] website for the visitor guidelines for Inpatients (after your surgery is over and you are in a regular room).       Your procedure is scheduled on:  06/08/2022    Report to [facility] Main Entrance    Report to admitting at    1115AM   Call this number if you have problems the morning of surgery [phone]   Do not eat food :After Midnight.   After Midnight you may have the following liquids until _ 1045_____ AM  DAY OF SURGERY  Water Non-Citrus Juices (without pulp, NO RED-Apple, White grape, White cranberry) Black Coffee (NO MILK/CREAM OR CREAMERS, sugar ok)  Clear Tea (NO MILK/CREAM OR CREAMERS, sugar ok) regular and decaf                             Plain Jell-O (NO RED)                                           Fruit ices (not with fruit pulp, NO RED)                                     Popsicles (NO RED)                                                               Sports drinks like Gatorade (NO RED)                    The day of surgery:  Drink ONE (1) Pre-Surgery Clear Ensure or G2 at    1045AM  ( have completed by ) the morning of surgery. Drink in one sitting. Do not sip.  This drink was given to you during your hospital  pre-op appointment visit. Nothing else to  drink after completing the  Pre-Surgery Clear Ensure or G2.          If you have questions, please contact your surgeon's office.       Oral Hygiene is also important to reduce your risk of infection.                                    Remember - BRUSH YOUR TEETH THE MORNING  OF SURGERY WITH YOUR REGULAR TOOTHPASTE  DENTURES WILL BE REMOVED PRIOR TO SURGERY PLEASE DO NOT APPLY "Poly grip" OR ADHESIVES!!!   Do NOT smoke after Midnight   Take these medicines the morning of surgery with A SIP OF WATER:  inhalers as usual and bring, cym balta, eye drops as usual   DO NOT TAKE ANY ORAL DIABETIC MEDICATIONS DAY OF YOUR SURGERY  Bring CPAP mask and tubing day of surgery.                              You may not have any metal on your body including hair pins, jewelry, and body piercing             Do not wear make-up, lotions, powders, perfumes/cologne, or deodorant  Do not wear nail polish including gel and S&S, artificial/acrylic nails, or any other type of covering on natural nails including finger and toenails. If you have artificial nails, gel coating, etc. that needs to be removed by a nail salon please have this removed prior to surgery or surgery may need to be canceled/ delayed if the surgeon/ anesthesia feels like they are unable to be safely monitored.   Do not shave  48 hours prior to surgery.               Men may shave face and neck.   Do not bring valuables to the hospital. [place].   Contacts, glasses, dentures or bridgework may not be worn into surgery.   Bring small overnight bag day of surgery.   DO NOT [place]. PHARMACY WILL DISPENSE MEDICATIONS LISTED ON YOUR MEDICATION LIST TO YOU DURING YOUR ADMISSION [place]!    Patients discharged on the day of surgery will not be allowed to drive home.  Someone NEEDS to stay with you for the first 24 hours after  anesthesia.   Special Instructions: Bring a copy of your healthcare power of attorney and living will documents the day of surgery if you haven't scanned them before.              Please read over the following fact sheets you were given: IF [place] [phone]   If you received a COVID test during your pre-op visit  it is requested that you wear a mask when out in public, stay away from anyone that may not be feeling well and notify your surgeon if you develop symptoms. If you test positive for Covid or have been in contact with anyone that has tested positive in the last 10 days please notify you surgeon.    [facility] - Preparing for Surgery Before surgery, you can play an important role.  Because skin is not sterile, your skin needs to be as free of germs as possible.  You can reduce the number of germs on your skin by washing with CHG (chlorahexidine gluconate) soap before surgery.  CHG is an antiseptic cleaner which kills germs and bonds with the skin to continue killing germs even after washing. Please DO NOT use if you have an allergy to CHG or antibacterial soaps.  If your skin becomes reddened/irritated stop using the CHG and inform your nurse when you arrive at Short Stay. Do not shave (including legs  and underarms) for at least 48 hours prior to the first CHG shower.  You may shave your face/neck. Please follow these instructions carefully:  1.  Shower with CHG Soap the night before surgery and the  morning of Surgery.  2.  If you choose to wash your hair, wash your hair first as usual with your  normal  shampoo.  3.  After you shampoo, rinse your hair and body thoroughly to remove the  shampoo.                           4.  Use CHG as you would any other liquid soap.  You can apply chg directly  to the skin and wash                       Gently with a scrungie or clean washcloth.  5.  Apply the CHG Soap to your body ONLY FROM THE  NECK DOWN.   Do not use on face/ open                           Wound or open sores. Avoid contact with eyes, ears mouth and genitals (private parts).                       Wash face,  Genitals (private parts) with your normal soap.             6.  Wash thoroughly, paying special attention to the area where your surgery  will be performed.  7.  Thoroughly rinse your body with warm water from the neck down.  8.  DO NOT shower/wash with your normal soap after using and rinsing off  the CHG Soap.                9.  Pat yourself dry with a clean towel.            10.  Wear clean pajamas.            11.  Place clean sheets on your bed the night of your first shower and do not  sleep with pets. Day of Surgery : Do not apply any lotions/deodorants the morning of surgery.  Please wear clean clothes to the hospital/surgery center.  FAILURE TO FOLLOW THESE INSTRUCTIONS MAY RESULT IN THE CANCELLATION OF YOUR SURGERY PATIENT SIGNATURE_________________________________  NURSE SIGNATURE__________________________________  ________________________________________________________________________

## 2022-06-01 ENCOUNTER — Encounter (HOSPITAL_COMMUNITY): Payer: Self-pay

## 2022-06-01 ENCOUNTER — Other Ambulatory Visit: Payer: Self-pay

## 2022-06-01 ENCOUNTER — Encounter (HOSPITAL_COMMUNITY)
Admission: RE | Admit: 2022-06-01 | Discharge: 2022-06-01 | Disposition: A | Discharge status: Home / Self Care | Payer: 59 | Source: Ambulatory Visit | Attending: Orthopedic Surgery | Admitting: Orthopedic Surgery

## 2022-06-01 VITALS — BP 130/90 | HR 63 | Temp 98.4°F | Resp 16 | Ht 72.0 in | Wt 192.0 lb

## 2022-06-01 DIAGNOSIS — Z01812 Encounter for preprocedural laboratory examination: Secondary | ICD-10-CM | POA: Insufficient documentation

## 2022-06-01 DIAGNOSIS — Z01818 Encounter for other preprocedural examination: Secondary | ICD-10-CM

## 2022-06-01 HISTORY — DX: Chronic sinusitis, unspecified: J32.9

## 2022-06-01 HISTORY — DX: Unspecified osteoarthritis, unspecified site: M19.90

## 2022-06-01 HISTORY — DX: Personal history of urinary calculi: Z87.442

## 2022-06-01 LAB — SURGICAL PCR SCREEN
MRSA, PCR: NEGATIVE
Staphylococcus aureus: NEGATIVE

## 2022-06-03 ENCOUNTER — Other Ambulatory Visit: Payer: Self-pay

## 2022-06-03 DIAGNOSIS — M542 Cervicalgia: Secondary | ICD-10-CM

## 2022-06-03 MED ORDER — HYDROCODONE-ACETAMINOPHEN 7.5-325 MG PO TABS: 1.0000 | ORAL_TABLET | ORAL | 0 refills | Status: DC | PRN

## 2022-06-03 NOTE — Telephone Encounter (Signed)
Patient called requesting refill on Hydrocodone. Medication last filled 05/04/2022. Patient does not have updated treatment agreement on file. Treatment agreement dated 10/25/2020.  Patient also stated that his hip surgery has been postponed due to him having a sinus infection.   Medication pended and sent to [facility].

## 2022-06-08 LAB — TYPE AND SCREEN
ABO/RH(D): O POS
Antibody Screen: NEGATIVE

## 2022-06-11 ENCOUNTER — Ambulatory Visit: Payer: 59 | Admitting: Neurology

## 2022-06-17 NOTE — Progress Notes (Signed)
Patient phoned to give updated information on surgery.  Date of Surgery - 06-24-22  Arrival Time - 6:00 and check in at admitting.    NPO Status - patient reminded to not eat solid food after midnight the night before surgery, and from midnight until 5:30 may have clear liquids  Medications morning of surgery - Cymbalta, Hydrocodone if needed and okay to use eydrops and nasal spray.   No change in medical history, allergies per patient.  Surgery rescheduled due to sinus infection.  Patient states that he is symptom free and is feeling well.    All questions answered and patient stated understanding.  He will come in on 06-18-22 for labs.  New copy instructions will be given to patient at lab visit.

## 2022-06-17 NOTE — Patient Instructions (Addendum)
SURGICAL WAITING ROOM VISITATION Patients having surgery or a procedure may have no more than 2 support people in the waiting area - these visitors may rotate.    Children under the age of 58 must have an adult with them who is not the patient.  If the patient needs to stay at the hospital during part of their recovery, the visitor guidelines for inpatient rooms apply. Pre-op nurse will coordinate an appropriate time for 1 support person to accompany patient in pre-op.  This support person may not rotate.    Please refer to the [facility] website for the visitor guidelines for Inpatients (after your surgery is over and you are in a regular room).       Your procedure is scheduled on: 06-24-22   Report to [facility] Main Entrance    Report to admitting at 6:00 AM   Call this number if you have problems the morning of surgery [phone]   Do not eat food :After Midnight.   After Midnight you may have the following liquids until 5:30 AM DAY OF SURGERY  Water Non-Citrus Juices (without pulp, NO RED) Carbonated Beverages Black Coffee (NO MILK/CREAM OR CREAMERS, sugar ok)  Clear Tea (NO MILK/CREAM OR CREAMERS, sugar ok) regular and decaf                             Plain Jell-O (NO RED)                                           Fruit ices (not with fruit pulp, NO RED)                                     Popsicles (NO RED)                                                               Sports drinks like Gatorade (NO RED)                   The day of surgery:  Drink ONE (1) Pre-Surgery Clear Ensure at 5:30 AM the morning of surgery. Drink in one sitting. Do not sip.  This drink was given to you during your hospital  pre-op appointment visit. Nothing else to drink after completing the Pre-Surgery Clear Ensure .          If you have questions, please contact your surgeon's office.   FOLLOW ANY ADDITIONAL PRE OP INSTRUCTIONS YOU RECEIVED FROM YOUR SURGEON'S OFFICE!!!      Oral Hygiene is also important to reduce your risk of infection.                                    Remember - BRUSH YOUR TEETH THE MORNING OF SURGERY WITH YOUR REGULAR TOOTHPASTE   Do NOT smoke after Midnight   Take these medicines the morning of surgery with A SIP OF WATER:   Cymbalta  Hydrocodone if needed  Okay  to inhalers and eyedrops  DO NOT TAKE ANY ORAL DIABETIC MEDICATIONS DAY OF YOUR SURGERY  Bring CPAP mask and tubing day of surgery.                              You may not have any metal on your body including jewelry, and body piercing             Do not wear lotions, powders, cologne, or deodorant              Men may shave face and neck.   Do not bring valuables to the hospital. [place].   Contacts, dentures or bridgework may not be worn into surgery.   Bring small overnight bag day of surgery.   DO NOT [place]. PHARMACY WILL DISPENSE MEDICATIONS LISTED ON YOUR MEDICATION LIST TO YOU DURING YOUR ADMISSION [place]!    Special Instructions: Bring a copy of your healthcare power of attorney and living will documents the day of surgery if you haven't scanned them before.              Please read over the following fact sheets you were given: IF [place] [phone]   If you received a COVID test during your pre-op visit  it is requested that you wear a mask when out in public, stay away from anyone that may not be feeling well and notify your surgeon if you develop symptoms. If you test positive for Covid or have been in contact with anyone that has tested positive in the last 10 days please notify you surgeon.   Incentive Spirometer  An incentive spirometer is a tool that can help keep your lungs clear and active. This tool measures how well you are filling your lungs with each breath. Taking long deep breaths may help reverse or  decrease the chance of developing breathing (pulmonary) problems (especially infection) following: A long period of time when you are unable to move or be active. BEFORE THE PROCEDURE  If the spirometer includes an indicator to show your best effort, your nurse or respiratory therapist will set it to a desired goal. If possible, sit up straight or lean slightly forward. Try not to slouch. Hold the incentive spirometer in an upright position. INSTRUCTIONS FOR USE  Sit on the edge of your bed if possible, or sit up as far as you can in bed or on a chair. Hold the incentive spirometer in an upright position. Breathe out normally. Place the mouthpiece in your mouth and seal your lips tightly around it. Breathe in slowly and as deeply as possible, raising the piston or the ball toward the top of the column. Hold your breath for 3-5 seconds or for as long as possible. Allow the piston or ball to fall to the bottom of the column. Remove the mouthpiece from your mouth and breathe out normally. Rest for a few seconds and repeat Steps 1 through 7 at least 10 times every 1-2 hours when you are awake. Take your time and take a few normal breaths between deep breaths. The spirometer may include an indicator to show your best effort. Use the indicator as a goal to work toward during each repetition. After each set of 10 deep breaths, practice coughing to be sure your lungs are clear. If  you have an incision (the cut made at the time of surgery), support your incision when coughing by placing a pillow or rolled up towels firmly against it. Once you are able to get out of bed, walk around indoors and cough well. You may stop using the incentive spirometer when instructed by your caregiver.  RISKS AND COMPLICATIONS Take your time so you do not get dizzy or light-headed. If you are in pain, you may need to take or ask for pain medication before doing incentive spirometry. It is harder to take a deep breath if you  are having pain. AFTER USE Rest and breathe slowly and easily. It can be helpful to keep track of a log of your progress. Your caregiver can provide you with a simple table to help with this. If you are using the spirometer at home, follow these instructions: [place] IF:  You are having difficultly using the spirometer. You have trouble using the spirometer as often as instructed. Your pain medication is not giving enough relief while using the spirometer. You develop fever of 100.5 F (38.1 C) or higher. SEEK IMMEDIATE MEDICAL CARE IF:  You cough up bloody sputum that had not been present before. You develop fever of 102 F (38.9 C) or greater. You develop worsening pain at or near the incision site. MAKE SURE YOU:  Understand these instructions. Will watch your condition. Will get help right away if you are not doing well or get worse. Document Released: 07/20/2006 Document Revised: 06/01/2011 Document Reviewed: 09/20/2006 Exit[employer] Patient Information 2014 [employer], [place].   ________________________________________________________________________ WHAT IS A BLOOD TRANSFUSION? Blood Transfusion Information  A transfusion is the replacement of blood or some of its parts. Blood is made up of multiple cells which provide different functions. Red blood cells carry oxygen and are used for blood loss replacement. White blood cells fight against infection. Platelets control bleeding. Plasma helps clot blood. Other blood products are available for specialized needs, such as hemophilia or other clotting disorders. BEFORE THE TRANSFUSION  Who gives blood for transfusions?  Healthy volunteers who are fully evaluated to make sure their blood is safe. This is blood bank blood. Transfusion therapy is the safest it has ever been in the practice of medicine. Before blood is taken from a donor, a complete history is taken to make sure that person has no history of diseases nor engages in  risky social behavior (examples are intravenous drug use or sexual activity with multiple partners). The donor's travel history is screened to minimize risk of transmitting infections, such as malaria. The donated blood is tested for signs of infectious diseases, such as HIV and hepatitis. The blood is then tested to be sure it is compatible with you in order to minimize the chance of a transfusion reaction. If you or a relative donates blood, this is often done in anticipation of surgery and is not appropriate for emergency situations. It takes many days to process the donated blood. RISKS AND COMPLICATIONS Although transfusion therapy is very safe and saves many lives, the main dangers of transfusion include:  Getting an infectious disease. Developing a transfusion reaction. This is an allergic reaction to something in the blood you were given. Every precaution is taken to prevent this. The decision to have a blood transfusion has been considered carefully by your caregiver before blood is given. Blood is not given unless the benefits outweigh the risks. AFTER THE TRANSFUSION Right after receiving a blood transfusion, you will usually feel much  better and more energetic. This is especially true if your red blood cells have gotten low (anemic). The transfusion raises the level of the red blood cells which carry oxygen, and this usually causes an energy increase. The nurse administering the transfusion will monitor you carefully for complications. HOME CARE INSTRUCTIONS  No special instructions are needed after a transfusion. You may find your energy is better. Speak with your caregiver about any limitations on activity for underlying diseases you may have. SEEK MEDICAL CARE IF:  Your condition is not improving after your transfusion. You develop redness or irritation at the intravenous (IV) site. SEEK IMMEDIATE MEDICAL CARE IF:  Any of the following symptoms occur over the next 12 hours: Shaking  chills. You have a temperature by mouth above 102 F (38.9 C), not controlled by medicine. Chest, back, or muscle pain. People around you feel you are not acting correctly or are confused. Shortness of breath or difficulty breathing. Dizziness and fainting. You get a rash or develop hives. You have a decrease in urine output. Your urine turns a dark color or changes to pink, red, or brown. Any of the following symptoms occur over the next 10 days: You have a temperature by mouth above 102 F (38.9 C), not controlled by medicine. Shortness of breath. Weakness after normal activity. The white part of the eye turns yellow (jaundice). You have a decrease in the amount of urine or are urinating less often. Your urine turns a dark color or changes to pink, red, or brown. Document Released: 03/06/2000 Document Revised: 06/01/2011 Document Reviewed: 10/24/2007 [facility] Patient Information 2014 [place], [place].  _______________________________________________________________________

## 2022-06-18 ENCOUNTER — Encounter (HOSPITAL_COMMUNITY)
Admission: RE | Admit: 2022-06-18 | Discharge: 2022-06-18 | Disposition: A | Discharge status: Home / Self Care | Payer: 59 | Source: Ambulatory Visit | Attending: Orthopedic Surgery | Admitting: Orthopedic Surgery

## 2022-06-18 DIAGNOSIS — Z01818 Encounter for other preprocedural examination: Secondary | ICD-10-CM

## 2022-06-18 DIAGNOSIS — N289 Disorder of kidney and ureter, unspecified: Secondary | ICD-10-CM | POA: Diagnosis present

## 2022-06-18 DIAGNOSIS — Z01812 Encounter for preprocedural laboratory examination: Secondary | ICD-10-CM | POA: Diagnosis not present

## 2022-06-18 LAB — BASIC METABOLIC PANEL
Anion gap: 9 (ref 5–15)
BUN: 20 mg/dL (ref 8–23)
CO2: 23 mmol/L (ref 22–32)
Calcium: 9.6 mg/dL (ref 8.9–10.3)
Chloride: 105 mmol/L (ref 98–111)
Creatinine, Ser: 0.89 mg/dL (ref 0.61–1.24)
GFR, Estimated: >60 mL/min (ref >60)
Glucose, Bld: 94 mg/dL (ref 70–99)
Potassium: 4.3 mmol/L (ref 3.5–5.1)
Sodium: 137 mmol/L (ref 135–145)

## 2022-06-18 LAB — CBC
HCT: 43 % (ref 39.0–52.0)
Hemoglobin: 13.6 g/dL (ref 13.0–17.0)
MCH: 27.1 pg (ref 26.0–34.0)
MCHC: 31.6 g/dL (ref 30.0–36.0)
MCV: 85.7 fL (ref 80.0–100.0)
Platelets: 210 10*3/uL (ref 150–400)
RBC: 5.02 MIL/uL (ref 4.22–5.81)
RDW: 14.6 % (ref 11.5–15.5)
WBC: 5.6 10*3/uL (ref 4.0–10.5)
nRBC: 0 % (ref 0.0–0.2)

## 2022-06-23 NOTE — Anesthesia Preprocedure Evaluation (Addendum)
Anesthesia Evaluation    Reviewed: Allergy & Precautions, Patient's Chart, lab work & pertinent test results  Airway Mallampati: II  TM Distance: >3 FB Neck ROM: Full    Dental no notable dental hx.    Pulmonary asthma (childhood) , sleep apnea and Continuous Positive Airway Pressure Ventilation , former smoker   Pulmonary exam normal breath sounds clear to auscultation       Cardiovascular negative cardio ROS Normal cardiovascular exam Rhythm:Regular Rate:Normal     Neuro/Psych  PSYCHIATRIC DISORDERS Anxiety Depression    negative neurological ROS     GI/Hepatic Neg liver ROS, hiatal hernia,,,  Endo/Other  negative endocrine ROS    Renal/GU negative Renal ROS  negative genitourinary   Musculoskeletal  (+) Arthritis , Osteoarthritis,    Abdominal   Peds  Hematology negative hematology ROS (+) Hb 13.6, plt 210   Anesthesia Other Findings   Reproductive/Obstetrics negative OB ROS                             Anesthesia Physical Anesthesia Plan  ASA: 3  Anesthesia Plan: Spinal and MAC   Post-op Pain Management: Ofirmev IV (intra-op)*   Induction:   PONV Risk Score and Plan: 2 and Propofol infusion and TIVA  Airway Management Planned: Natural Airway and Nasal Cannula  Additional Equipment: None  Intra-op Plan:   Post-operative Plan:   Informed Consent: I have reviewed the patients History and Physical, chart, labs and discussed the procedure including the risks, benefits and alternatives for the proposed anesthesia with the patient or authorized representative who has indicated his/her understanding and acceptance.     Dental advisory given  Plan Discussed with: CRNA  Anesthesia Plan Comments:         Anesthesia Quick Evaluation

## 2022-06-24 ENCOUNTER — Ambulatory Visit (HOSPITAL_COMMUNITY): Payer: 59

## 2022-06-24 ENCOUNTER — Other Ambulatory Visit: Payer: Self-pay

## 2022-06-24 ENCOUNTER — Encounter (HOSPITAL_COMMUNITY): Payer: Self-pay | Admitting: Orthopedic Surgery

## 2022-06-24 ENCOUNTER — Observation Stay (HOSPITAL_COMMUNITY)
Admission: RE | Admit: 2022-06-24 | Discharge: 2022-06-25 | Disposition: A | Discharge status: Home / Self Care | Payer: 59 | Source: Ambulatory Visit | Attending: Orthopedic Surgery | Admitting: Orthopedic Surgery

## 2022-06-24 ENCOUNTER — Ambulatory Visit (HOSPITAL_COMMUNITY): Payer: 59 | Admitting: Physician Assistant

## 2022-06-24 ENCOUNTER — Observation Stay (HOSPITAL_COMMUNITY): Payer: 59

## 2022-06-24 ENCOUNTER — Encounter (HOSPITAL_COMMUNITY)
Admission: RE | Disposition: A | Discharge status: Home / Self Care | Payer: Self-pay | Source: Ambulatory Visit | Attending: Orthopedic Surgery

## 2022-06-24 ENCOUNTER — Ambulatory Visit (HOSPITAL_BASED_OUTPATIENT_CLINIC_OR_DEPARTMENT_OTHER): Payer: 59 | Admitting: Anesthesiology

## 2022-06-24 DIAGNOSIS — Z79899 Other long term (current) drug therapy: Secondary | ICD-10-CM | POA: Insufficient documentation

## 2022-06-24 DIAGNOSIS — Z87891 Personal history of nicotine dependence: Secondary | ICD-10-CM | POA: Diagnosis not present

## 2022-06-24 DIAGNOSIS — N289 Disorder of kidney and ureter, unspecified: Secondary | ICD-10-CM

## 2022-06-24 DIAGNOSIS — Z7982 Long term (current) use of aspirin: Secondary | ICD-10-CM | POA: Insufficient documentation

## 2022-06-24 DIAGNOSIS — M1612 Unilateral primary osteoarthritis, left hip: Secondary | ICD-10-CM

## 2022-06-24 DIAGNOSIS — Z7952 Long term (current) use of systemic steroids: Secondary | ICD-10-CM | POA: Insufficient documentation

## 2022-06-24 DIAGNOSIS — Z01818 Encounter for other preprocedural examination: Secondary | ICD-10-CM

## 2022-06-24 DIAGNOSIS — Z9989 Dependence on other enabling machines and devices: Secondary | ICD-10-CM | POA: Diagnosis not present

## 2022-06-24 DIAGNOSIS — F418 Other specified anxiety disorders: Secondary | ICD-10-CM | POA: Diagnosis not present

## 2022-06-24 DIAGNOSIS — J45909 Unspecified asthma, uncomplicated: Secondary | ICD-10-CM | POA: Diagnosis not present

## 2022-06-24 DIAGNOSIS — G4733 Obstructive sleep apnea (adult) (pediatric): Secondary | ICD-10-CM | POA: Diagnosis not present

## 2022-06-24 DIAGNOSIS — M169 Osteoarthritis of hip, unspecified: Secondary | ICD-10-CM | POA: Diagnosis present

## 2022-06-24 HISTORY — PX: TOTAL HIP ARTHROPLASTY: SHX124

## 2022-06-24 LAB — TYPE AND SCREEN
ABO/RH(D): O POS
Antibody Screen: NEGATIVE

## 2022-06-24 SURGERY — ARTHROPLASTY, HIP, TOTAL, ANTERIOR APPROACH
Anesthesia: Monitor Anesthesia Care | Site: Hip | Laterality: Left

## 2022-06-24 MED ORDER — SODIUM CHLORIDE 0.9 % IV BOLUS
500.0000 mL | Freq: Once | INTRAVENOUS | Status: AC
  Administered 2022-06-24: 500 mL via INTRAVENOUS

## 2022-06-24 MED ORDER — PHENYLEPHRINE HCL (PRESSORS) 10 MG/ML IV SOLN
INTRAVENOUS | Status: AC
  Filled 2022-06-24: qty 1

## 2022-06-24 MED ORDER — DULOXETINE HCL 60 MG PO CPEP
60.0000 mg | ORAL_CAPSULE | Freq: Every day | ORAL | Status: DC
  Administered 2022-06-25: 60 mg via ORAL
  Filled 2022-06-24: qty 1

## 2022-06-24 MED ORDER — BUPIVACAINE IN DEXTROSE 0.75-8.25 % IT SOLN
INTRATHECAL | Status: DC | PRN
  Administered 2022-06-24: 1.7 mL via INTRATHECAL

## 2022-06-24 MED ORDER — FLEET ENEMA 7-19 GM/118ML RE ENEM: 1.0000 | ENEMA | Freq: Once | RECTAL | Status: DC | PRN

## 2022-06-24 MED ORDER — POLYETHYLENE GLYCOL 3350 17 G PO PACK: 17.0000 g | PACK | Freq: Every day | ORAL | Status: DC | PRN

## 2022-06-24 MED ORDER — ACETAMINOPHEN 325 MG PO TABS
325.0000 mg | ORAL_TABLET | Freq: Four times a day (QID) | ORAL | Status: DC | PRN
  Filled 2022-06-24: qty 2

## 2022-06-24 MED ORDER — ONDANSETRON HCL 4 MG/2ML IJ SOLN: 4.0000 mg | Freq: Four times a day (QID) | INTRAMUSCULAR | Status: DC | PRN

## 2022-06-24 MED ORDER — 0.9 % SODIUM CHLORIDE (POUR BTL) OPTIME
TOPICAL | Status: DC | PRN
  Administered 2022-06-24: 1000 mL

## 2022-06-24 MED ORDER — OXYCODONE HCL 5 MG PO TABS
5.0000 mg | ORAL_TABLET | ORAL | Status: DC | PRN
  Administered 2022-06-24: 5 mg via ORAL
  Administered 2022-06-25: 10 mg via ORAL
  Filled 2022-06-24: qty 1
  Filled 2022-06-24 (×3): qty 2
  Filled 2022-06-24: qty 1
  Filled 2022-06-24: qty 2

## 2022-06-24 MED ORDER — CEFAZOLIN SODIUM-DEXTROSE 2-4 GM/100ML-% IV SOLN
2.0000 g | Freq: Four times a day (QID) | INTRAVENOUS | Status: AC
  Administered 2022-06-24 (×2): 2 g via INTRAVENOUS
  Filled 2022-06-24 (×2): qty 100

## 2022-06-24 MED ORDER — DOCUSATE SODIUM 100 MG PO CAPS
100.0000 mg | ORAL_CAPSULE | Freq: Two times a day (BID) | ORAL | Status: DC
  Administered 2022-06-24 – 2022-06-25 (×2): 100 mg via ORAL
  Filled 2022-06-24 (×2): qty 1

## 2022-06-24 MED ORDER — ACETAMINOPHEN 10 MG/ML IV SOLN
1000.0000 mg | Freq: Once | INTRAVENOUS | Status: AC
  Administered 2022-06-24: 1000 mg via INTRAVENOUS
  Filled 2022-06-24: qty 100

## 2022-06-24 MED ORDER — DIPHENHYDRAMINE HCL 12.5 MG/5ML PO ELIX: 12.5000 mg | ORAL_SOLUTION | ORAL | Status: DC | PRN

## 2022-06-24 MED ORDER — OXYCODONE HCL 5 MG PO TABS
10.0000 mg | ORAL_TABLET | ORAL | Status: DC | PRN
  Administered 2022-06-24: 10 mg via ORAL
  Administered 2022-06-24: 15 mg via ORAL
  Administered 2022-06-25: 10 mg via ORAL
  Administered 2022-06-25: 15 mg via ORAL
  Filled 2022-06-24: qty 3

## 2022-06-24 MED ORDER — HYDROMORPHONE HCL 1 MG/ML IJ SOLN
0.5000 mg | INTRAMUSCULAR | Status: DC | PRN
  Administered 2022-06-24: 0.5 mg via INTRAVENOUS
  Filled 2022-06-24: qty 1

## 2022-06-24 MED ORDER — POVIDONE-IODINE 10 % EX SWAB: 2.0000 | Freq: Once | CUTANEOUS | Status: DC

## 2022-06-24 MED ORDER — METOCLOPRAMIDE HCL 5 MG PO TABS: 5.0000 mg | ORAL_TABLET | Freq: Three times a day (TID) | ORAL | Status: DC | PRN

## 2022-06-24 MED ORDER — LATANOPROST 0.005 % OP SOLN
1.0000 [drp] | Freq: Every day | OPHTHALMIC | Status: DC
  Filled 2022-06-24 (×2): qty 2.5

## 2022-06-24 MED ORDER — MIDAZOLAM HCL 2 MG/2ML IJ SOLN
INTRAMUSCULAR | Status: AC
  Filled 2022-06-24: qty 2

## 2022-06-24 MED ORDER — BUPIVACAINE HCL (PF) 0.25 % IJ SOLN
INTRAMUSCULAR | Status: AC
  Filled 2022-06-24: qty 30

## 2022-06-24 MED ORDER — BUPIVACAINE HCL 0.25 % IJ SOLN
INTRAMUSCULAR | Status: DC | PRN
  Administered 2022-06-24: 30 mL

## 2022-06-24 MED ORDER — ONDANSETRON HCL 4 MG/2ML IJ SOLN
INTRAMUSCULAR | Status: DC | PRN
  Administered 2022-06-24: 4 mg via INTRAVENOUS

## 2022-06-24 MED ORDER — METHOCARBAMOL 500 MG PO TABS
500.0000 mg | ORAL_TABLET | Freq: Four times a day (QID) | ORAL | Status: DC | PRN
  Administered 2022-06-25: 500 mg via ORAL
  Filled 2022-06-24 (×2): qty 1

## 2022-06-24 MED ORDER — METHOCARBAMOL 500 MG IVPB - SIMPLE MED
500.0000 mg | Freq: Four times a day (QID) | INTRAVENOUS | Status: DC | PRN
  Filled 2022-06-24: qty 55

## 2022-06-24 MED ORDER — SODIUM CHLORIDE 0.9 % IV SOLN: INTRAVENOUS | Status: DC

## 2022-06-24 MED ORDER — LACTATED RINGERS IV SOLN: INTRAVENOUS | Status: DC

## 2022-06-24 MED ORDER — ASPIRIN 81 MG PO CHEW
81.0000 mg | CHEWABLE_TABLET | Freq: Two times a day (BID) | ORAL | Status: DC
  Administered 2022-06-25: 81 mg via ORAL
  Filled 2022-06-24: qty 1

## 2022-06-24 MED ORDER — CEFAZOLIN SODIUM-DEXTROSE 2-4 GM/100ML-% IV SOLN
2.0000 g | INTRAVENOUS | Status: AC
  Administered 2022-06-24: 2 g via INTRAVENOUS
  Filled 2022-06-24: qty 100

## 2022-06-24 MED ORDER — BISACODYL 10 MG RE SUPP: 10.0000 mg | Freq: Every day | RECTAL | Status: DC | PRN

## 2022-06-24 MED ORDER — ONDANSETRON HCL 4 MG/2ML IJ SOLN
INTRAMUSCULAR | Status: AC
  Filled 2022-06-24: qty 2

## 2022-06-24 MED ORDER — MIDAZOLAM HCL 2 MG/2ML IJ SOLN
INTRAMUSCULAR | Status: DC | PRN
  Administered 2022-06-24: 2 mg via INTRAVENOUS

## 2022-06-24 MED ORDER — PROPOFOL 500 MG/50ML IV EMUL
INTRAVENOUS | Status: DC | PRN
  Administered 2022-06-24: 50 ug/kg/min via INTRAVENOUS

## 2022-06-24 MED ORDER — WATER FOR IRRIGATION, STERILE IR SOLN
Status: DC | PRN
  Administered 2022-06-24: 2000 mL

## 2022-06-24 MED ORDER — ONDANSETRON HCL 4 MG PO TABS: 4.0000 mg | ORAL_TABLET | Freq: Four times a day (QID) | ORAL | Status: DC | PRN

## 2022-06-24 MED ORDER — EPINEPHRINE PF 1 MG/ML IJ SOLN
INTRAMUSCULAR | Status: AC
  Filled 2022-06-24: qty 1

## 2022-06-24 MED ORDER — MENTHOL 3 MG MT LOZG: 1.0000 | LOZENGE | OROMUCOSAL | Status: DC | PRN

## 2022-06-24 MED ORDER — PROPOFOL 10 MG/ML IV BOLUS
INTRAVENOUS | Status: DC | PRN
  Administered 2022-06-24: 860 mg via INTRAVENOUS

## 2022-06-24 MED ORDER — PROPOFOL 1000 MG/100ML IV EMUL
INTRAVENOUS | Status: AC
  Filled 2022-06-24: qty 100

## 2022-06-24 MED ORDER — DEXAMETHASONE SODIUM PHOSPHATE 10 MG/ML IJ SOLN
INTRAMUSCULAR | Status: DC | PRN
  Administered 2022-06-24: 10 mg via INTRAVENOUS

## 2022-06-24 MED ORDER — DEXAMETHASONE SODIUM PHOSPHATE 10 MG/ML IJ SOLN: 8.0000 mg | Freq: Once | INTRAMUSCULAR | Status: DC

## 2022-06-24 MED ORDER — METOCLOPRAMIDE HCL 5 MG/ML IJ SOLN: 5.0000 mg | Freq: Three times a day (TID) | INTRAMUSCULAR | Status: DC | PRN

## 2022-06-24 MED ORDER — DEXAMETHASONE SODIUM PHOSPHATE 10 MG/ML IJ SOLN
INTRAMUSCULAR | Status: AC
  Filled 2022-06-24: qty 1

## 2022-06-24 MED ORDER — PHENOL 1.4 % MT LIQD: 1.0000 | OROMUCOSAL | Status: DC | PRN

## 2022-06-24 MED ORDER — ORAL CARE MOUTH RINSE: 15.0000 mL | Freq: Once | OROMUCOSAL | Status: AC

## 2022-06-24 MED ORDER — TRANEXAMIC ACID-NACL 1000-0.7 MG/100ML-% IV SOLN
1000.0000 mg | INTRAVENOUS | Status: AC
  Administered 2022-06-24: 1000 mg via INTRAVENOUS
  Filled 2022-06-24: qty 100

## 2022-06-24 MED ORDER — ACETAMINOPHEN 500 MG PO TABS
1000.0000 mg | ORAL_TABLET | Freq: Four times a day (QID) | ORAL | Status: AC
  Administered 2022-06-24 – 2022-06-25 (×4): 1000 mg via ORAL
  Filled 2022-06-24 (×4): qty 2

## 2022-06-24 MED ORDER — ALBUTEROL SULFATE (2.5 MG/3ML) 0.083% IN NEBU: 2.5000 mg | INHALATION_SOLUTION | Freq: Four times a day (QID) | RESPIRATORY_TRACT | Status: DC | PRN

## 2022-06-24 MED ORDER — CHLORHEXIDINE GLUCONATE 0.12 % MT SOLN
15.0000 mL | Freq: Once | OROMUCOSAL | Status: AC
  Administered 2022-06-24: 15 mL via OROMUCOSAL

## 2022-06-24 MED ORDER — DEXAMETHASONE SODIUM PHOSPHATE 10 MG/ML IJ SOLN
10.0000 mg | Freq: Once | INTRAMUSCULAR | Status: AC
  Administered 2022-06-25: 10 mg via INTRAVENOUS
  Filled 2022-06-24: qty 1

## 2022-06-24 SURGICAL SUPPLY — 33 items
BLADE SAG 18X100X1.27 (BLADE) ×1 IMPLANT
COVER PERINEAL POST (MISCELLANEOUS) ×1 IMPLANT
COVER SURGICAL LIGHT HANDLE (MISCELLANEOUS) ×1 IMPLANT
CUP ACETBLR 54 OD PINNACLE (Hips) IMPLANT
DRAPE FOOT SWITCH (DRAPES) ×1 IMPLANT
DRAPE STERI IOBAN 125X83 (DRAPES) ×1 IMPLANT
DRAPE U-SHAPE 47X51 STRL (DRAPES) ×2 IMPLANT
DRSG AQUACEL AG ADV 3.5X10 (GAUZE/BANDAGES/DRESSINGS) ×1 IMPLANT
DURAPREP 26ML APPLICATOR (WOUND CARE) ×1 IMPLANT
ELECT REM PT RETURN 15FT ADLT (MISCELLANEOUS) ×1 IMPLANT
GLOVE BIO SURGEON STRL SZ 6.5 (GLOVE) IMPLANT
GLOVE BIO SURGEON STRL SZ8 (GLOVE) ×1 IMPLANT
GLOVE BIOGEL PI IND STRL 7.0 (GLOVE) IMPLANT
GLOVE BIOGEL PI IND STRL 8 (GLOVE) ×1 IMPLANT
GOWN STRL REUS W/ TWL LRG LVL3 (GOWN DISPOSABLE) ×1 IMPLANT
GOWN STRL REUS W/TWL LRG LVL3 (GOWN DISPOSABLE) ×2
HEAD CERAMIC 36 PLUS 8.5 12 14 (Hips) IMPLANT
HOLDER FOLEY CATH W/STRAP (MISCELLANEOUS) ×1 IMPLANT
KIT TURNOVER KIT A (KITS) IMPLANT
LINER MARATHON NEUT +4X54X36 (Hips) IMPLANT
MANIFOLD NEPTUNE II (INSTRUMENTS) ×1 IMPLANT
PACK ANTERIOR HIP CUSTOM (KITS) ×1 IMPLANT
PENCIL SMOKE EVACUATOR COATED (MISCELLANEOUS) ×1 IMPLANT
STEM FEMORAL SZ8 STD ACTIS (Stem) IMPLANT
STRIP CLOSURE SKIN 1/2X4 (GAUZE/BANDAGES/DRESSINGS) ×1 IMPLANT
SUT ETHIBOND NAB CT1 #1 30IN (SUTURE) ×1 IMPLANT
SUT MNCRL AB 4-0 PS2 18 (SUTURE) ×1 IMPLANT
SUT STRATAFIX 0 PDS 27 VIOLET (SUTURE) ×1
SUT VIC AB 2-0 CT1 27 (SUTURE) ×2
SUT VIC AB 2-0 CT1 TAPERPNT 27 (SUTURE) ×2 IMPLANT
SUTURE STRATFX 0 PDS 27 VIOLET (SUTURE) ×1 IMPLANT
TRAY FOLEY MTR SLVR 16FR STAT (SET/KITS/TRAYS/PACK) ×1 IMPLANT
TUBE SUCTION HIGH CAP CLEAR NV (SUCTIONS) ×1 IMPLANT

## 2022-06-24 NOTE — Anesthesia Postprocedure Evaluation (Signed)
Anesthesia Post Note  Patient: James [name]  Procedure(s) Performed: TOTAL HIP ARTHROPLASTY ANTERIOR APPROACH (Left: Hip)     Patient location during evaluation: PACU Anesthesia Type: MAC and Spinal Level of consciousness: awake and alert and oriented Pain management: pain level controlled Vital Signs Assessment: post-procedure vital signs reviewed and stable Respiratory status: spontaneous breathing, nonlabored ventilation and respiratory function stable Cardiovascular status: blood pressure returned to baseline and stable Postop Assessment: no headache, no backache, spinal receding and patient able to bend at knees Anesthetic complications: no   No notable events documented.  Last Vitals:  Vitals:   06/24/22 1045 06/24/22 1100  BP: 125/84 115/81  Pulse: 64 66  Resp: 11   Temp:  36.5 C  SpO2: 98% 96%    Last Pain:  Vitals:   06/24/22 1100  TempSrc:   PainSc: 0-No pain                 Pervis Hocking

## 2022-06-24 NOTE — Anesthesia Procedure Notes (Signed)
Spinal  Patient location during procedure: OR Start time: 06/24/2022 8:31 AM End time: 06/24/2022 8:33 AM Reason for block: surgical anesthesia Staffing Performed: resident/CRNA  Resident/CRNA: [place] Greb C, CRNA Performed by: [place] Manus Rudd, CRNA Authorized by: Pervis Hocking, DO   Preanesthetic Checklist Completed: patient identified, IV checked, site marked, risks and benefits discussed, surgical consent, monitors and equipment checked, pre-op evaluation and timeout performed Spinal Block Patient position: sitting Prep: DuraPrep and site prepped and draped Patient monitoring: heart rate, cardiac monitor, continuous pulse ox and blood pressure Approach: midline Location: L3-4 Injection technique: single-shot Needle Needle type: Pencan  Needle gauge: 24 G Needle length: 9 cm Assessment Sensory level: T4 Events: CSF return Additional Notes IV functioning, monitors applied to pt. Expiration date of kit checked and confirmed to be in date. Sterile prep and drape, hand hygiene and sterile gloved used. Pt was positioned and spine was prepped in sterile fashion. Skin was anesthetized with lidocaine. Free flow of clear CSF obtained prior to injecting local anesthetic into CSF x 1 attempt. Spinal needle aspirated freely following injection. Needle was carefully withdrawn, and pt tolerated procedure well. Loss of motor and sensory on exam post injection.

## 2022-06-24 NOTE — Addendum Note (Signed)
Addendum  created 06/24/22 1158 by [place] Cortny Bambach C, CRNA   Child order released for a procedure order, Clinical Note Signed, Intraprocedure Blocks edited, SmartForm saved

## 2022-06-24 NOTE — Op Note (Signed)
OPERATIVE REPORT- TOTAL HIP ARTHROPLASTY   PREOPERATIVE DIAGNOSIS: Osteoarthritis of the Left hip.   POSTOPERATIVE DIAGNOSIS: Osteoarthritis of the Left  hip.   PROCEDURE: Left total hip arthroplasty, anterior approach.   SURGEON: Gaynelle Arabian, MD   ASSISTANT: Theresa Duty, PA-C  ANESTHESIA:  Spinal  ESTIMATED BLOOD LOSS:- 250 ml  DRAINS: None  COMPLICATIONS: None   CONDITION: PACU - hemodynamically stable.   BRIEF CLINICAL NOTE: James [name] is a 62 y.o. male who has advanced end-  stage arthritis of their Left  hip with progressively worsening pain and  dysfunction.The patient has failed nonoperative management and presents for  total hip arthroplasty.   PROCEDURE IN DETAIL: After successful administration of spinal  anesthetic, the traction boots for the [facility] bed were placed on both  feet and the patient was placed onto the [facility] bed, boots placed into the leg  holders. The Left hip was then isolated from the perineum with plastic  drapes and prepped and draped in the usual sterile fashion. ASIS and  greater trochanter were marked and a oblique incision was made, starting  at about 1 cm lateral and 2 cm distal to the ASIS and coursing towards  the anterior cortex of the femur. The skin was cut with a 10 blade  through subcutaneous tissue to the level of the fascia overlying the  tensor fascia lata muscle. The fascia was then incised in line with the  incision at the junction of the anterior third and posterior 2/3rd. The  muscle was teased off the fascia and then the interval between the TFL  and the rectus was developed. The Hohmann retractor was then placed at  the top of the femoral neck over the capsule. The vessels overlying the  capsule were cauterized and the fat on top of the capsule was removed.  A Hohmann retractor was then placed anterior underneath the rectus  femoris to give exposure to the entire anterior capsule. A T-shaped   capsulotomy was performed. The edges were tagged and the femoral head  was identified.       Osteophytes are removed off the superior acetabulum.  The femoral neck was then cut in situ with an oscillating saw. Traction  was then applied to the left lower extremity utilizing the [facility]  traction. The femoral head was then removed. Retractors were placed  around the acetabulum and then circumferential removal of the labrum was  performed. Osteophytes were also removed. Reaming starts at 49 mm to  medialize and  Increased in 2 mm increments to 53 mm. We reamed in  approximately 40 degrees of abduction, 20 degrees anteversion. A 54 mm  pinnacle acetabular shell was then impacted in anatomic position under  fluoroscopic guidance with excellent purchase. We did not need to place  any additional dome screws. A 36 mm neutral + 4 marathon liner was then  placed into the acetabular shell.       The femoral lift was then placed along the lateral aspect of the femur  just distal to the vastus ridge. The leg was  externally rotated and capsule  was stripped off the inferior aspect of the femoral neck down to the  level of the lesser trochanter, this was done with electrocautery. The femur was lifted after this was performed. The  leg was then placed in an extended and adducted position essentially delivering the femur. We also removed the capsule superiorly and the piriformis from the piriformis fossa to gain  excellent exposure of the  proximal femur. Rongeur was used to remove some cancellous bone to get  into the lateral portion of the proximal femur for placement of the  initial starter reamer. The starter broaches was placed  the starter broach  and was shown to go down the center of the canal. Broaching  with the Actis system was then performed starting at size 0  coursing  Up to size 8. A size 8 had excellent torsional and rotational  and axial stability. The trial standard offset neck was then  placed  with a 36 + 8.5 trial head. The hip was then reduced. We confirmed that  the stem was in the canal both on AP and lateral x-rays. It also has excellent sizing. The hip was reduced with outstanding stability through full extension and full external rotation.. AP pelvis was taken and the leg lengths were measured and found to be equal. Hip was then dislocated again and the femoral head and neck removed. The  femoral broach was removed. Size 8 Actis stem with a standard offset  neck was then impacted into the femur following native anteversion. Has  excellent purchase in the canal. Excellent torsional and rotational and  axial stability. It is confirmed to be in the canal on AP and lateral  fluoroscopic views. The 36 + 8.5 ceramic head was placed and the hip  reduced with outstanding stability. Again AP pelvis was taken and it  confirmed that the leg lengths were equal. The wound was then copiously  irrigated with saline solution and the capsule reattached and repaired  with Ethibond suture. 30 ml of .25% Bupivicaine was  injected into the capsule and into the edge of the tensor fascia lata as well as subcutaneous tissue. The fascia overlying the tensor fascia lata was then closed with a running #1 V-Loc. Subcu was closed with interrupted 2-0 Vicryl and subcuticular running 4-0 Monocryl. Incision was cleaned  and dried. Steri-Strips and a bulky sterile dressing applied. The patient was awakened and transported to  recovery in stable condition.        Please note that a surgical assistant was a medical necessity for this procedure to perform it in a safe and expeditious manner. Assistant was necessary to provide appropriate retraction of vital neurovascular structures and to prevent femoral fracture and allow for anatomic placement of the prosthesis.  Gaynelle Arabian, M.D.

## 2022-06-24 NOTE — Transfer of Care (Signed)
Immediate Anesthesia Transfer of Care Note  Patient: James [name]  Procedure(s) Performed: TOTAL HIP ARTHROPLASTY ANTERIOR APPROACH (Left: Hip)  Patient Location: PACU  Anesthesia Type:Spinal  Level of Consciousness: awake, alert , and oriented  Airway & Oxygen Therapy: Patient Spontanous Breathing and Patient connected to face mask oxygen  Post-op Assessment: Report given to RN and Post -op Vital signs reviewed and stable  Post vital signs: Reviewed and stable  Last Vitals:  Vitals Value Taken Time  BP 103/65 06/24/22 1001  Temp    Pulse 76 06/24/22 1003  Resp 13 06/24/22 1003  SpO2 99 % 06/24/22 1003  Vitals shown include unvalidated device data.  Last Pain:  Vitals:   06/24/22 0656  TempSrc: Oral  PainSc:       Patients Stated Pain Goal: 4 ([id number])  Complications: No notable events documented.

## 2022-06-24 NOTE — Evaluation (Addendum)
Physical Therapy Evaluation Patient Details Name: James [name] MRN: [id number] DOB: 1960/04/05 Today's Date: 06/24/2022  History of Present Illness  62 yo male/ s/p L THA- direct anterior on 06/24/22. PMH, SVT, anxiety/depesion, cervicalgia.  Clinical Impression  Pt admitted with above diagnosis.  Pt currently with functional limitations due to the deficits listed below (see PT Problem List). Pt will benefit from acute skilled PT to increase their independence and safety with mobility to allow discharge.     The patient demonstrates active LLE  ROM in bed. Assisted to siting with onset of nausea, blurry vision and dizziness.  BP sitting 96/64, assisted back to supine with BP 83/50. Patient  assisted to sitting again with dizziness again, BP 67/50. Assisted back to supine. RN called to room. Placed in slight  trendelenberg.  BP 82/72. Patient remained in supine.   Continue PT for mobility  in AM.       Recommendations for follow up therapy are one component of a multi-disciplinary discharge planning process, led by the attending physician.  Recommendations may be updated based on patient status, additional functional criteria and insurance authorization.  Follow Up Recommendations       Assistance Recommended at Discharge Set up Supervision/Assistance  Patient can return home with the following  A little help with walking and/or transfers;Assistance with cooking/housework;A little help with bathing/dressing/bathroom;Help with stairs or ramp for entrance;Assist for transportation    Equipment Recommendations Rolling walker (2 wheels)  Recommendations for Other Services       Functional Status Assessment Patient has had a recent decline in their functional status and demonstrates the ability to make significant improvements in function in a reasonable and predictable amount of time.     Precautions / Restrictions Precautions Precautions: Fall Precaution Comments: hypotensive       Mobility  Bed Mobility Overal bed mobility: Needs Assistance Bed Mobility: Supine to Sit, Sit to Supine     Supine to sit: Min assist Sit to supine: Min assist   General bed mobility comments: patient  reports dizziness  after sitting, assisted back to supine, sat  up again and  returned to supine with orthostasis    Transfers                   General transfer comment: unable  due to BP drop    Ambulation/Gait                  Stairs            Wheelchair Mobility    Modified Rankin (Stroke Patients Only)       Balance Overall balance assessment: Needs assistance Sitting-balance support: Feet supported, No upper extremity supported Sitting balance-Leahy Scale: Good         Standing balance comment: NT                             Pertinent Vitals/Pain Pain Assessment Pain Assessment: 0-10 Pain Score: 6  Pain Location: left hip Pain Descriptors / Indicators: Discomfort Pain Intervention(s): Limited activity within patient's tolerance, Monitored during session, Premedicated before session, Ice applied    Home Living Family/patient expects to be discharged to:: Private residence Living Arrangements: Spouse/significant other Available Help at Discharge: Family;Available 24 hours/day Type of Home: House Home Access: Stairs to enter   [employer] of Steps: 1   Home Layout: Two level;Able to live on main level with bedroom/bathroom Home Equipment: None  Prior Function Prior Level of Function : Independent/Modified Independent;Driving                     Hand Dominance   Dominant Hand: Right    Extremity/Trunk Assessment   Upper Extremity Assessment Upper Extremity Assessment: Overall WFL for tasks assessed    Lower Extremity Assessment Lower Extremity Assessment: LLE deficits/detail LLE Deficits / Details: able to flex hip  in supine    Cervical / Trunk Assessment Cervical / Trunk  Assessment: Normal  Communication   Communication: No difficulties  Cognition Arousal/Alertness: Awake/alert Behavior During Therapy: WFL for tasks assessed/performed Overall Cognitive Status: Within Functional Limits for tasks assessed                                          General Comments      Exercises Total Joint Exercises Heel Slides: AAROM, Left, 5 reps, Supine   Assessment/Plan    PT Assessment Patient needs continued PT services  PT Problem List Decreased strength;Decreased mobility;Decreased safety awareness;Decreased knowledge of precautions;Decreased range of motion;Decreased activity tolerance;Cardiopulmonary status limiting activity;Pain;Decreased knowledge of use of DME       PT Treatment Interventions DME instruction;Therapeutic activities;Gait training;Therapeutic exercise;Patient/family education;Functional mobility training    PT Goals (Current goals can be found in the Care Plan section)  Acute Rehab PT Goals Patient Stated Goal: go home PT Goal Formulation: With patient Time For Goal Achievement: 07/01/22 Potential to Achieve Goals: Good    Frequency 7X/week     Co-evaluation               AM-PAC PT "6 Clicks" Mobility  Outcome Measure Help needed turning from your back to your side while in a flat bed without using bedrails?: A Little Help needed moving from lying on your back to sitting on the side of a flat bed without using bedrails?: A Little Help needed moving to and from a bed to a chair (including a wheelchair)?: Total Help needed standing up from a chair using your arms (e.g., wheelchair or bedside chair)?: Total Help needed to walk in hospital room?: Total Help needed climbing 3-5 steps with a railing? : Total 6 Click Score: 10    End of Session   Activity Tolerance: Treatment limited secondary to medical complications (Comment) Patient left: in bed;with call bell/phone within reach;with nursing/sitter in  room Nurse Communication: Mobility status PT Visit Diagnosis: Difficulty in walking, not elsewhere classified (R26.2);Pain Pain - Right/Left: Left Pain - part of body: Hip    Time: [id number] PT Time Calculation (min) (ACUTE ONLY): 28 min   Charges:   PT Evaluation $PT Eval Low Complexity: 1 Low PT Treatments $Therapeutic Activity: 8-22 mins        [place] [facility] [phone] Weekend [id number]   Claretha Cooper 06/24/2022, 4:00 PM

## 2022-06-24 NOTE — Progress Notes (Signed)
Patient up with PT feeing dizzy and diaphoretic, b/p 67/50 after returning to bed 97/63 K Edmisten PA notified and orders received D Mateo Flow RN

## 2022-06-24 NOTE — Interval H&P Note (Signed)
History and Physical Interval Note:  06/24/2022 6:50 AM  James [name]  has presented today for surgery, with the diagnosis of Left hip osteoarthritis.  The various methods of treatment have been discussed with the patient and family. After consideration of risks, benefits and other options for treatment, the patient has consented to  Procedure(s): TOTAL HIP ARTHROPLASTY ANTERIOR APPROACH (Left) as a surgical intervention.  The patient's history has been reviewed, patient examined, no change in status, stable for surgery.  I have reviewed the patient's chart and labs.  Questions were answered to the patient's satisfaction.     Pilar Plate Sugey Trevathan

## 2022-06-24 NOTE — Discharge Instructions (Signed)
James Arabian, MD Total Joint Specialist [facility] [address], [address] [place], [place] [address] [phone]  ANTERIOR APPROACH TOTAL HIP REPLACEMENT POSTOPERATIVE DIRECTIONS     Hip Rehabilitation, Guidelines Following Surgery  The results of a hip operation are greatly improved after range of motion and muscle strengthening exercises. Follow all safety measures which are given to protect your hip. If any of these exercises cause increased pain or swelling in your joint, decrease the amount until you are comfortable again. Then slowly increase the exercises. Call your caregiver if you have problems or questions.   BLOOD CLOT PREVENTION Take an 81 mg Aspirin two times a day for three weeks following surgery. Then resume one 81 mg Aspirin once a day. You may resume your vitamins/supplements upon discharge from the hospital. Do not take any NSAIDs (Advil, Aleve, Ibuprofen, Meloxicam, etc.) until you have discontinued the 81 mg Aspirin twice a day.  HOME CARE INSTRUCTIONS  Remove items at home which could result in a fall. This includes throw rugs or furniture in walking pathways.  ICE to the affected hip as frequently as 20-30 minutes an hour and then as needed for pain and swelling. Continue to use ice on the hip for pain and swelling from surgery. You may notice swelling that will progress down to the foot and ankle. This is normal after surgery. Elevate the leg when you are not up walking on it.   Continue to use the breathing machine which will help keep your temperature down.  It is common for your temperature to cycle up and down following surgery, especially at night when you are not up moving around and exerting yourself.  The breathing machine keeps your lungs expanded and your temperature down.  DIET You may resume your previous home diet once your are discharged from the hospital.  DRESSING / WOUND CARE / SHOWERING You have an adhesive waterproof  bandage over the incision. Leave this in place until your first follow-up appointment. Once you remove this you will not need to place another bandage.  You may begin showering 3 days following surgery, but do not submerge the incision under water.  ACTIVITY For the first 3-5 days, it is important to rest and keep the operative leg elevated. You should, as a general rule, rest for 50 minutes and walk/stretch for 10 minutes per hour. After 5 days, you may slowly increase activity as tolerated.  Perform the exercises you were provided twice a day for about 15-20 minutes each session. Begin these 2 days following surgery. Walk with your walker as instructed. Use the walker until you are comfortable transitioning to a cane. Walk with the cane in the opposite hand of the operative leg. You may discontinue the cane once you are comfortable and walking steadily. Avoid periods of inactivity such as sitting longer than an hour when not asleep. This helps prevent blood clots.  Do not drive a car for 6 weeks or until released by your surgeon.  Do not drive while taking narcotics.  TED HOSE STOCKINGS Wear the elastic stockings on both legs for three weeks following surgery during the day. You may remove them at night while sleeping.  WEIGHT BEARING Weight bearing as tolerated with assist device (walker, cane, etc) as directed, use it as long as suggested by your surgeon or therapist, typically at least 4-6 weeks.  POSTOPERATIVE CONSTIPATION PROTOCOL Constipation - defined medically as fewer than three stools per week and severe constipation as less than one  stool per week.  One of the most common issues patients have following surgery is constipation.  Even if you have a regular bowel pattern at home, your normal regimen is likely to be disrupted due to multiple reasons following surgery.  Combination of anesthesia, postoperative narcotics, change in appetite and fluid intake all can affect your bowels.  In  order to avoid complications following surgery, here are some recommendations in order to help you during your recovery period.  Colace (docusate) - Pick up an over-the-counter form of Colace or another stool softener and take twice a day as long as you are requiring postoperative pain medications.  Take with a full glass of water daily.  If you experience loose stools or diarrhea, hold the colace until you stool forms back up.  If your symptoms do not get better within 1 week or if they get worse, check with your doctor. Dulcolax (bisacodyl) - Pick up over-the-counter and take as directed by the product packaging as needed to assist with the movement of your bowels.  Take with a full glass of water.  Use this product as needed if not relieved by Colace only.  MiraLax (polyethylene glycol) - Pick up over-the-counter to have on hand.  MiraLax is a solution that will increase the amount of water in your bowels to assist with bowel movements.  Take as directed and can mix with a glass of water, juice, soda, coffee, or tea.  Take if you go more than two days without a movement.Do not use MiraLax more than once per day. Call your doctor if you are still constipated or irregular after using this medication for 7 days in a row.  If you continue to have problems with postoperative constipation, please contact the office for further assistance and recommendations.  If you experience "the worst abdominal pain ever" or develop nausea or vomiting, please contact the office immediatly for further recommendations for treatment.  ITCHING  If you experience itching with your medications, try taking only a single pain pill, or even half a pain pill at a time.  You can also use Benadryl over the counter for itching or also to help with sleep.   MEDICATIONS See your medication summary on the "After Visit Summary" that the nursing staff will review with you prior to discharge.  You may have some home medications which will  be placed on hold until you complete the course of blood thinner medication.  It is important for you to complete the blood thinner medication as prescribed by your surgeon.  Continue your approved medications as instructed at time of discharge.  PRECAUTIONS If you experience chest pain or shortness of breath - call 911 immediately for transfer to the hospital emergency department.  If you develop a fever greater that 101 F, purulent drainage from wound, increased redness or drainage from wound, foul odor from the wound/dressing, or calf pain - CONTACT YOUR SURGEON.                                                   FOLLOW-UP APPOINTMENTS Make sure you keep all of your appointments after your operation with your surgeon and caregivers. You should call the office at the above phone number and make an appointment for approximately two weeks after the date of your surgery or on the date instructed by  your surgeon outlined in the "After Visit Summary".  RANGE OF MOTION AND STRENGTHENING EXERCISES  These exercises are designed to help you keep full movement of your hip joint. Follow your caregiver's or physical therapist's instructions. Perform all exercises about fifteen times, three times per day or as directed. Exercise both hips, even if you have had only one joint replacement. These exercises can be done on a training (exercise) mat, on the floor, on a table or on a bed. Use whatever works the best and is most comfortable for you. Use music or television while you are exercising so that the exercises are a pleasant break in your day. This will make your life better with the exercises acting as a break in routine you can look forward to.  Lying on your back, slowly slide your foot toward your buttocks, raising your knee up off the floor. Then slowly slide your foot back down until your leg is straight again.  Lying on your back spread your legs as far apart as you can without causing discomfort.  Lying on  your side, raise your upper leg and foot straight up from the floor as far as is comfortable. Slowly lower the leg and repeat.  Lying on your back, tighten up the muscle in the front of your thigh (quadriceps muscles). You can do this by keeping your leg straight and trying to raise your heel off the floor. This helps strengthen the largest muscle supporting your knee.  Lying on your back, tighten up the muscles of your buttocks both with the legs straight and with the knee bent at a comfortable angle while keeping your heel on the floor.   POST-OPERATIVE OPIOID TAPER INSTRUCTIONS: It is important to wean off of your opioid medication as soon as possible. If you do not need pain medication after your surgery it is ok to stop day one. Opioids include: Codeine, Hydrocodone(Norco, Vicodin), Oxycodone(Percocet, oxycontin) and hydromorphone amongst others.  Long term and even short term use of opiods can cause: Increased pain response Dependence Constipation Depression Respiratory depression And more.  Withdrawal symptoms can include Flu like symptoms Nausea, vomiting And more Techniques to manage these symptoms Hydrate well Eat regular healthy meals Stay active Use relaxation techniques(deep breathing, meditating, yoga) Do Not substitute Alcohol to help with tapering If you have been on opioids for less than two weeks and do not have pain than it is ok to stop all together.  Plan to wean off of opioids This plan should start within one week post op of your joint replacement. Maintain the same interval or time between taking each dose and first decrease the dose.  Cut the total daily intake of opioids by one tablet each day Next start to increase the time between doses. The last dose that should be eliminated is the evening dose.   IF YOU ARE TRANSFERRED TO A SKILLED REHAB FACILITY If the patient is transferred to a skilled rehab facility following release from the hospital, a list of  the current medications will be sent to the facility for the patient to continue.  When discharged from the skilled rehab facility, please have the facility set up the patient's [place] prior to being released. Also, the skilled facility will be responsible for providing the patient with their medications at time of release from the facility to include their pain medication, the muscle relaxants, and their blood thinner medication. If the patient is still at the rehab facility at time of  the two week follow up appointment, the skilled rehab facility will also need to assist the patient in arranging follow up appointment in our office and any transportation needs.  MAKE SURE YOU:  Understand these instructions.  Get help right away if you are not doing well or get worse.    DENTAL ANTIBIOTICS:  In most cases prophylactic antibiotics for Dental procdeures after total joint surgery are not necessary.  Exceptions are as follows:  1. History of prior total joint infection  2. Severely immunocompromised (Organ Transplant, cancer chemotherapy, Rheumatoid biologic meds such as [place])  3. Poorly controlled diabetes (A1C &gt; 8.0, blood glucose over 200)  If you have one of these conditions, contact your surgeon for an antibiotic prescription, prior to your dental procedure.    Pick up stool softner and laxative for home use following surgery while on pain medications. Do not submerge incision under water. Please use good hand washing techniques while changing dressing each day. May shower starting three days after surgery. Please use a clean towel to pat the incision dry following showers. Continue to use ice for pain and swelling after surgery. Do not use any lotions or creams on the incision until instructed by your surgeon.

## 2022-06-25 ENCOUNTER — Encounter (HOSPITAL_COMMUNITY): Payer: Self-pay | Admitting: Orthopedic Surgery

## 2022-06-25 DIAGNOSIS — M1612 Unilateral primary osteoarthritis, left hip: Secondary | ICD-10-CM | POA: Diagnosis not present

## 2022-06-25 LAB — CBC
HCT: 29.2 % — ABNORMAL LOW (ref 39.0–52.0)
HCT: 32 % — ABNORMAL LOW (ref 39.0–52.0)
Hemoglobin: 9.3 g/dL — ABNORMAL LOW (ref 13.0–17.0)
Hemoglobin: 10.2 g/dL — ABNORMAL LOW (ref 13.0–17.0)
MCH: 27.4 pg (ref 26.0–34.0)
MCH: 27.6 pg (ref 26.0–34.0)
MCHC: 31.8 g/dL (ref 30.0–36.0)
MCHC: 31.9 g/dL (ref 30.0–36.0)
MCV: 85.9 fL (ref 80.0–100.0)
MCV: 86.5 fL (ref 80.0–100.0)
Platelets: 176 10*3/uL (ref 150–400)
Platelets: 196 10*3/uL (ref 150–400)
RBC: 3.4 MIL/uL — ABNORMAL LOW (ref 4.22–5.81)
RBC: 3.7 MIL/uL — ABNORMAL LOW (ref 4.22–5.81)
RDW: 14.6 % (ref 11.5–15.5)
RDW: 14.5 % (ref 11.5–15.5)
WBC: 10.4 10*3/uL (ref 4.0–10.5)
WBC: 10.2 10*3/uL (ref 4.0–10.5)
nRBC: 0 % (ref 0.0–0.2)
nRBC: 0 % (ref 0.0–0.2)

## 2022-06-25 LAB — BASIC METABOLIC PANEL
Anion gap: 7 (ref 5–15)
BUN: 21 mg/dL (ref 8–23)
CO2: 24 mmol/L (ref 22–32)
Calcium: 8.4 mg/dL — ABNORMAL LOW (ref 8.9–10.3)
Chloride: 104 mmol/L (ref 98–111)
Creatinine, Ser: 0.88 mg/dL (ref 0.61–1.24)
GFR, Estimated: >60 mL/min (ref >60)
Glucose, Bld: 129 mg/dL — ABNORMAL HIGH (ref 70–99)
Potassium: 3.9 mmol/L (ref 3.5–5.1)
Sodium: 135 mmol/L (ref 135–145)

## 2022-06-25 MED ORDER — OXYCODONE HCL 5 MG PO TABS: 5.0000 mg | ORAL_TABLET | Freq: Four times a day (QID) | ORAL | 0 refills | Status: DC | PRN

## 2022-06-25 MED ORDER — METHOCARBAMOL 500 MG PO TABS: 500.0000 mg | ORAL_TABLET | Freq: Four times a day (QID) | ORAL | 0 refills | Status: DC | PRN

## 2022-06-25 MED ORDER — ASPIRIN 81 MG PO CHEW: 81.0000 mg | CHEWABLE_TABLET | Freq: Two times a day (BID) | ORAL | 0 refills | Status: AC

## 2022-06-25 NOTE — TOC Transition Note (Signed)
Transition of Care [facility]) - CM/SW Discharge Note  Patient Details  Name: James [name] MRN: [id number] Date of Birth: 1960/06/18  [facility] CM/SW Contact:  Sherie Don, LCSW Phone Number: 06/25/2022, 9:59 AM  Clinical Narrative: Patient is expected to discharge home after working with PT. CSW met with patient to confirm discharge plan. Patient will go home with a home exercise program (HEP). Patient reported he will borrow a rolling walker rather than receiving one at the hospital. [place] signing off.   Final next level of care: Home/Self Care Barriers to Discharge: No Barriers Identified  Patient Goals and CMS Choice Choice offered to / list presented to : NA  Discharge Plan and Services Additional resources added to the After Visit Summary for        DME Arranged: N/A DME Agency: NA  Social Determinants of Health (SDOH) Interventions SDOH Screenings   Food Insecurity: No Food Insecurity (06/24/2022)  Housing: Low Risk  (06/24/2022)  Transportation Needs: No Transportation Needs (06/24/2022)  Utilities: Not At Risk (06/24/2022)  Alcohol Screen: Low Risk  (05/16/2018)  Depression (PHQ2-9): Low Risk  (04/14/2021)  Tobacco Use: Medium Risk (06/24/2022)   Readmission Risk Interventions     No data to display

## 2022-06-25 NOTE — Plan of Care (Signed)
  Problem: Education: Goal: Knowledge of General Education information will improve Description: Including pain rating scale, medication(s)/side effects and non-pharmacologic comfort measures Outcome: Progressing   Problem: Activity: Goal: Risk for activity intolerance will decrease Outcome: Progressing   Problem: Pain Managment: Goal: General experience of comfort will improve Outcome: Progressing   

## 2022-06-25 NOTE — Plan of Care (Signed)
  Problem: Safety: Goal: Ability to remain free from injury will improve Outcome: Progressing   Problem: Education: Goal: Knowledge of the prescribed therapeutic regimen will improve Outcome: Progressing   Problem: Pain Management: Goal: Pain level will decrease with appropriate interventions Outcome: Progressing   

## 2022-06-25 NOTE — Progress Notes (Signed)
Physical Therapy Treatment Patient Details Name: James [name] MRN: [id number] DOB: 08-Aug-1960 Today's Date: 06/25/2022   History of Present Illness 62 yo male/ s/p L THA- direct anterior on 06/24/22. PMH, SVT, anxiety/depesion, cervicalgia.    PT Comments    POD #1  Took vitals with pt supine in bed (118/78 and 89 bpm). Educated on strap. Assisted pt out of bed and used VCs for correct hand placement. Assisted with ambulation down the hallway and took vitals while resting against the wall (122/74 and 93 bpm). Assisted with ambulation back to chair and did sitting exercises. Educated on standing exercises. Addressed all mobility questions, discussed appropriate activity, educated on use of ICE.  Pt ready for D/C to home.    Recommendations for follow up therapy are one component of a multi-disciplinary discharge planning process, led by the attending physician.  Recommendations may be updated based on patient status, additional functional criteria and insurance authorization.  Follow Up Recommendations       Assistance Recommended at Discharge Set up Supervision/Assistance  Patient can return home with the following A little help with walking and/or transfers;Assistance with cooking/housework;A little help with bathing/dressing/bathroom;Help with stairs or ramp for entrance;Assist for transportation   Equipment Recommendations  Rolling walker (2 wheels)    Recommendations for Other Services       Precautions / Restrictions Precautions Precautions: Fall Restrictions Weight Bearing Restrictions: No     Mobility  Bed Mobility Overal bed mobility: Needs Assistance Bed Mobility: Supine to Sit     Supine to sit: Min guard     General bed mobility comments: VC for usage of belt to assist leg.    Transfers Overall transfer level: Needs assistance Equipment used: Rolling walker (2 wheels) Transfers: Sit to/from Stand, Bed to chair/wheelchair/BSC Sit to Stand: Min  guard   Step pivot transfers: Min guard       General transfer comment: VCs for hand placement and safety backing up.    Ambulation/Gait Ambulation/Gait assistance: Min guard Gait Distance (Feet): 80 Feet Assistive device: Rolling walker (2 wheels) Gait Pattern/deviations: Step-to pattern, Step-through pattern, Decreased stance time - left Gait velocity: decreased but increased later.     General Gait Details: VCs to encourage heel strike.   Stairs             Wheelchair Mobility    Modified Rankin (Stroke Patients Only)       Balance                                            Cognition Arousal/Alertness: Awake/alert Behavior During Therapy: WFL for tasks assessed/performed Overall Cognitive Status: Within Functional Limits for tasks assessed                                          Exercises Total Joint Exercises Ankle Circles/Pumps: AROM, Both, 20 reps, Seated Quad Sets: AAROM, Left, 10 reps, Seated Short Arc Quad: AROM, Left, 10 reps Heel Slides: AAROM, Left, 5 reps, Supine Hip ABduction/ADduction: AAROM, Left, 10 reps, Supine Long Arc Quad: AROM, Left, 10 reps, Seated    General Comments        Pertinent Vitals/Pain Pain Assessment Pain Assessment: 0-10 Pain Score: 3  Pain Location: left hip    Home Living  Prior Function            PT Goals (current goals can now be found in the care plan section) Acute Rehab PT Goals Patient Stated Goal: go home PT Goal Formulation: With patient Time For Goal Achievement: 07/01/22 Potential to Achieve Goals: Good Progress towards PT goals: Progressing toward goals    Frequency    7X/week      PT Plan Current plan remains appropriate    Co-evaluation              AM-PAC PT "6 Clicks" Mobility   Outcome Measure  Help needed turning from your back to your side while in a flat bed without using bedrails?: A  Little Help needed moving from lying on your back to sitting on the side of a flat bed without using bedrails?: A Little Help needed moving to and from a bed to a chair (including a wheelchair)?: A Little Help needed standing up from a chair using your arms (e.g., wheelchair or bedside chair)?: A Little Help needed to walk in hospital room?: A Little Help needed climbing 3-5 steps with a railing? : A Little 6 Click Score: 18    End of Session Equipment Utilized During Treatment: Gait belt Activity Tolerance: Patient tolerated treatment well Patient left: in chair;with call bell/phone within reach Nurse Communication: Mobility status PT Visit Diagnosis: Difficulty in walking, not elsewhere classified (R26.2);Pain Pain - Right/Left: Left Pain - part of body: Hip     Time: [id number] PT Time Calculation (min) (ACUTE ONLY): 33 min  Charges:  $Gait Training: 8-22 mins $Therapeutic Exercise: 8-22 mins                     Clayborne Artist, SPTA

## 2022-06-25 NOTE — Progress Notes (Signed)
Subjective: 1 Day Post-Op Procedure(s) (LRB): TOTAL HIP ARTHROPLASTY ANTERIOR APPROACH (Left) Patient seen in rounds by Dr. Wynelle Link. Patient is  well. Reports he became very dizzy, lightheaded, and nauseous when working with physical therapy yesterday. Received fluid bolus and started to feel better . Denies dizziness or lightheadedness this morning. Denies SOB or chest pain. Denies calf pain. Foley cath to be removed this AM. Patient reports pain as moderate. We will continue physical therapy today.   Objective: Vital signs in last 24 hours: Temp:  [96.5 F (35.8 C)-99 F (37.2 C)] 99 F (37.2 C) (04/04 0521) Pulse Rate:  [59-86] 86 (04/04 0521) Resp:  [11-20] 17 (04/04 0521) BP: (67-125)/(50-84) 110/65 (04/04 0521) SpO2:  [93 %-98 %] 95 % (04/04 0521)  Intake/Output from previous day:  Intake/Output Summary (Last 24 hours) at 06/25/2022 0729 Last data filed at 06/25/2022 0555 Gross per 24 hour  Intake 3927.53 ml  Output 2700 ml  Net 1227.53 ml     Intake/Output this shift: No intake/output data recorded.  Labs: Recent Labs    06/25/22 0329  HGB 9.3*   Recent Labs    06/25/22 0329  WBC 10.4  RBC 3.40*  HCT 29.2*  PLT 176   Recent Labs    06/25/22 0329  NA 135  K 3.9  CL 104  CO2 24  BUN 21  CREATININE 0.88  GLUCOSE 129*  CALCIUM 8.4*   No results for input(s): "LABPT", "INR" in the last 72 hours.  Exam: General - Patient is Alert and Oriented Extremity - Neurologically intact Neurovascular intact Sensation intact distally Dorsiflexion/Plantar flexion intact Dressing - dressing C/D/I Motor Function - intact, moving foot and toes well on exam.  Past Medical History:  Diagnosis Date   Anxiety    Arthritis    Asthma    chldhood asthma   BPH (benign prostatic hyperplasia)    Cervicalgia    degenerative changes and foraminal stenoses   Chest pain, unspecified    Depression    Family history of colonic polyps    Father with benign colon polyp    Hepatitis B antibody positive 1984   Hiatal hernia 09/19/2010   History of kidney stones    Hyperglycemia    104 on 10/10/10   Hyperlipidemia    Obstructive sleep apnea (adult) (pediatric)    using CPAP   Other malaise and fatigue    Sinus infection    diagnosed on 05/30/22 by [facility] - tx with amoxicillin and prednisone   Supraventricular premature beats    noted on EKG 05/18/12   Wrist pain, left     Assessment/Plan: 1 Day Post-Op Procedure(s) (LRB): TOTAL HIP ARTHROPLASTY ANTERIOR APPROACH (Left) Principal Problem:   OA (osteoarthritis) of hip Active Problems:   Osteoarthritis of left hip  Estimated body mass index is 26.04 kg/m as calculated from the following:   Height as of this encounter: 6' (1.829 m).   Weight as of this encounter: 87.1 kg. Advance diet Up with therapy D/C IV fluids  DVT Prophylaxis - Aspirin Weight bearing as tolerated.  Hgb decreased from 13.6 to 9.3 post-op. Asymptomatic this morning. Will recheck CBC at lunch today.  Continue physical therapy. Possible discharge home today pending progress with physical therapy, if meeting patient goals, and remains asymptomatic with stable hgb. Will do HEP once discharged. Follow-up in clinic in 2 weeks.  The PDMP database was reviewed today prior to any opioid medications being prescribed to this patient.  R. Jaynie Bream, PA-C Orthopedic  Surgery 06/25/2022, 7:29 AM

## 2022-06-26 ENCOUNTER — Telehealth: Payer: Self-pay

## 2022-06-26 NOTE — Telephone Encounter (Signed)
Transition Care Management Follow-up Telephone Call Date of discharge and from where: [facility] 06/25/22 How have you been since you were released from the hospital? Getting along great Any questions or concerns? No  Items Reviewed: Did the pt receive and understand the discharge instructions provided? Yes  Medications obtained and verified? Yes  Other? No  Any new allergies since your discharge? No  Dietary orders reviewed? Yes Do you have support at home? Yes   Home Care and Equipment/Supplies: Were home health services ordered? no  Functional Questionnaire: (I = Independent and D = Dependent) ADLs: I- has a walker  Bathing/Dressing- I   Meal Prep- I  Eating- I  Maintaining continence- I  Transferring/Ambulation- I- HAS A WALKER  Managing Meds- I  Follow up appointments reviewed:  PCP Hospital f/u appt confirmed? No   Specialist Hospital f/u appt confirmed? Yes  Scheduled to see SURGEON IN 2 WEEKS. Are transportation arrangements needed? No  If their condition worsens, is the pt aware to call PCP or go to the Emergency Dept.? Yes Was the patient provided with contact information for the PCP's office or ED? Yes Was to pt encouraged to call back with questions or concerns? Yes

## 2022-06-28 ENCOUNTER — Other Ambulatory Visit: Payer: Self-pay | Admitting: Nurse Practitioner

## 2022-06-28 DIAGNOSIS — M48061 Spinal stenosis, lumbar region without neurogenic claudication: Secondary | ICD-10-CM

## 2022-06-30 NOTE — Discharge Summary (Signed)
Physician Discharge Summary   Patient ID: James [name] MRN: [id number] DOB/AGE: 64/06/1960 64 y.o.  Admit date: 06/24/2022 Discharge date: 06/25/2022  Primary Diagnosis: Osteoarthritis of the left hip    Admission Diagnoses:  Past Medical History:  Diagnosis Date   Anxiety    Arthritis    Asthma    chldhood asthma   BPH (benign prostatic hyperplasia)    Cervicalgia    degenerative changes and foraminal stenoses   Chest pain, unspecified    Depression    Family history of colonic polyps    Father with benign colon polyp   Hepatitis B antibody positive 1984   Hiatal hernia 09/19/2010   History of kidney stones    Hyperglycemia    104 on 10/10/10   Hyperlipidemia    Obstructive sleep apnea (adult) (pediatric)    using CPAP   Other malaise and fatigue    Sinus infection    diagnosed on 05/30/22 by [facility] - tx with amoxicillin and prednisone   Supraventricular premature beats    noted on EKG 05/18/12   Wrist pain, left    Discharge Diagnoses:   Principal Problem:   OA (osteoarthritis) of hip Active Problems:   Osteoarthritis of left hip  Estimated body mass index is 26.04 kg/m as calculated from the following:   Height as of this encounter: 6' (1.829 m).   Weight as of this encounter: 87.1 kg.  Procedure:  Procedure(s) (LRB): TOTAL HIP ARTHROPLASTY ANTERIOR APPROACH (Left)   Consults: None  HPI: James [name] is a 62 y.o. male who has advanced end-stage arthritis of their Left  hip with progressively worsening pain and dysfunction.The patient has failed nonoperative management and presents for total hip arthroplasty.  Laboratory Data: Admission on 06/24/2022, Discharged on 06/25/2022  Component Date Value Ref Range Status   WBC 06/25/2022 10.4  4.0 - 10.5 K/uL Final   RBC 06/25/2022 3.40 (L)  4.22 - 5.81 MIL/uL Final   Hemoglobin 06/25/2022 9.3 (L)  13.0 - 17.0 g/dL Final   HCT 40/98/1191 29.2 (L)  39.0 - 52.0 % Final   MCV 06/25/2022 85.9  80.0 -  100.0 fL Final   MCH 06/25/2022 27.4  26.0 - 34.0 pg Final   MCHC 06/25/2022 31.8  30.0 - 36.0 g/dL Final   RDW 47/82/9562 14.6  11.5 - 15.5 % Final   Platelets 06/25/2022 176  150 - 400 K/uL Final   nRBC 06/25/2022 0.0  0.0 - 0.2 % Final   Performed at [facility], [address], [place], [place] [address]   Sodium 06/25/2022 135  135 - 145 mmol/L Final   Potassium 06/25/2022 3.9  3.5 - 5.1 mmol/L Final   Chloride 06/25/2022 104  98 - 111 mmol/L Final   CO2 06/25/2022 24  22 - 32 mmol/L Final   Glucose, Bld 06/25/2022 129 (H)  70 - 99 mg/dL Final   Glucose reference range applies only to samples taken after fasting for at least 8 hours.   BUN 06/25/2022 21  8 - 23 mg/dL Final   Creatinine, Ser 06/25/2022 0.88  0.61 - 1.24 mg/dL Final   Calcium 57/84/6962 8.4 (L)  8.9 - 10.3 mg/dL Final   GFR, Estimated 06/25/2022 >60  >60 mL/min Final   Comment: (NOTE) Calculated using the CKD-EPI Creatinine Equation (2021)    Anion gap 06/25/2022 7  5 - 15 Final   Performed at [facility], [address], [place], [place] [address]   WBC  06/25/2022 10.2  4.0 - 10.5 K/uL Final   RBC 06/25/2022 3.70 (L)  4.22 - 5.81 MIL/uL Final   Hemoglobin 06/25/2022 10.2 (L)  13.0 - 17.0 g/dL Final   HCT 24/46/2863 32.0 (L)  39.0 - 52.0 % Final   MCV 06/25/2022 86.5  80.0 - 100.0 fL Final   MCH 06/25/2022 27.6  26.0 - 34.0 pg Final   MCHC 06/25/2022 31.9  30.0 - 36.0 g/dL Final   RDW 81/77/1165 14.5  11.5 - 15.5 % Final   Platelets 06/25/2022 196  150 - 400 K/uL Final   nRBC 06/25/2022 0.0  0.0 - 0.2 % Final   Performed at [facility], [address], [place], [place] [address]  Hospital Outpatient Visit on 06/18/2022  Component Date Value Ref Range Status   Sodium 06/18/2022 137  135 - 145 mmol/L Final   Potassium 06/18/2022 4.3  3.5 - 5.1 mmol/L Final   Chloride 06/18/2022 105  98 - 111 mmol/L Final   CO2 06/18/2022 23  22 - 32 mmol/L Final    Glucose, Bld 06/18/2022 94  70 - 99 mg/dL Final   Glucose reference range applies only to samples taken after fasting for at least 8 hours.   BUN 06/18/2022 20  8 - 23 mg/dL Final   Creatinine, Ser 06/18/2022 0.89  0.61 - 1.24 mg/dL Final   Calcium 33/38/3291 9.6  8.9 - 10.3 mg/dL Final   GFR, Estimated 06/18/2022 >60  >60 mL/min Final   Comment: (NOTE) Calculated using the CKD-EPI Creatinine Equation (2021)    Anion gap 06/18/2022 9  5 - 15 Final   Performed at [facility], [address] [place], [place] [address]   WBC 06/18/2022 5.6  4.0 - 10.5 K/uL Final   RBC 06/18/2022 5.02  4.22 - 5.81 MIL/uL Final   Hemoglobin 06/18/2022 13.6  13.0 - 17.0 g/dL Final   HCT 60/06/5995 43.0  39.0 - 52.0 % Final   MCV 06/18/2022 85.7  80.0 - 100.0 fL Final   MCH 06/18/2022 27.1  26.0 - 34.0 pg Final   MCHC 06/18/2022 31.6  30.0 - 36.0 g/dL Final   RDW 74/14/2395 14.6  11.5 - 15.5 % Final   Platelets 06/18/2022 210  150 - 400 K/uL Final   nRBC 06/18/2022 0.0  0.0 - 0.2 % Final   Performed at [facility], [address] [place], [place] [address]   ABO/RH(D) 06/18/2022 O POS   Final   Antibody Screen 06/18/2022 NEG   Final   Sample Expiration 06/18/2022 06/27/2022,2359   Final   Extend sample reason 06/18/2022    Final                   Value:NO TRANSFUSIONS OR PREGNANCY IN THE PAST 3 MONTHS Performed at [facility], [address] [place], [place] [address]   Hospital Outpatient Visit on 06/01/2022  Component Date Value Ref Range Status   MRSA, PCR 06/01/2022 NEGATIVE  NEGATIVE Final   Staphylococcus aureus 06/01/2022 NEGATIVE  NEGATIVE Final   Comment: (NOTE) The Xpert SA Assay (FDA approved for NASAL specimens in patients 65 years of age and older), is one component of a comprehensive surveillance program. It is not intended to diagnose infection nor to guide or monitor treatment. Performed at [facility],  [address] [place], [place] [address]    ABO/RH(D) 06/01/2022 O POS   Final   Antibody Screen 06/01/2022 NEG  Final   Sample Expiration 06/01/2022 06/11/2022,2359   Final   Extend sample reason 06/01/2022    Final                   Value:NO TRANSFUSIONS OR PREGNANCY IN THE PAST 3 MONTHS Performed at [facility], [address], [place], [place] [address]   Appointment on 05/13/2022  Component Date Value Ref Range Status   WBC 05/13/2022 5.2  3.8 - 10.8 Thousand/uL Final   RBC 05/13/2022 4.78  4.20 - 5.80 Million/uL Final   Hemoglobin 05/13/2022 13.3  13.2 - 17.1 g/dL Final   HCT 38/45/3646 39.5  38.5 - 50.0 % Final   MCV 05/13/2022 82.6  80.0 - 100.0 fL Final   MCH 05/13/2022 27.8  27.0 - 33.0 pg Final   MCHC 05/13/2022 33.7  32.0 - 36.0 g/dL Final   RDW 80/32/1224 13.3  11.0 - 15.0 % Final   Platelets 05/13/2022 238  140 - 400 Thousand/uL Final   MPV 05/13/2022 11.1  7.5 - 12.5 fL Final     X-Rays:DG Pelvis Portable  Result Date: 06/24/2022 CLINICAL DATA:  Status post hip replacement. EXAM: PORTABLE PELVIS 1-2 VIEWS COMPARISON:  Preoperative imaging FINDINGS: Left hip arthroplasty in expected alignment. No periprosthetic lucency or fracture. Recent postsurgical change includes air and edema in the soft tissues. IMPRESSION: Left hip arthroplasty without immediate postoperative complication. Electronically Signed   By: Narda Rutherford M.D.   On: 06/24/2022 11:14   DG HIP UNILAT WITH PELVIS 1V LEFT  Result Date: 06/24/2022 CLINICAL DATA:  Intraoperative fluoroscopy for total left hip arthroplasty. EXAM: DG HIP (WITH OR WITHOUT PELVIS) 1V*L* COMPARISON:  None Available. FINDINGS: Images were performed intraoperatively without the presence of a radiologist. Status post total left hip arthroplasty. No hardware complication is seen. Total fluoroscopy images: 2 Total fluoroscopy time: 2 seconds Total dose: Radiation Exposure Index (as provided by the fluoroscopic  device): 0.491 mGy air Kerma Please see intraoperative findings for further detail. IMPRESSION: Intraoperative fluoroscopy for total left hip arthroplasty. Electronically Signed   By: Neita Garnet M.D.   On: 06/24/2022 10:02   DG C-Arm 1-60 Min-No Report  Result Date: 06/24/2022 Fluoroscopy was utilized by the requesting physician.  No radiographic interpretation.    EKG: Orders placed or performed in visit on 12/29/21   EKG 12-Lead     Hospital Course: James [name] is a 62 y.o. who was admitted to [facility]. They were brought to the operating room on 06/24/2022 and underwent Procedure(s): TOTAL HIP ARTHROPLASTY ANTERIOR APPROACH.  Patient tolerated the procedure well and was later transferred to the recovery room and then to the orthopaedic floor for postoperative care. They were given PO and IV analgesics for pain control following their surgery. They were given 24 hours of postoperative antibiotics of  Anti-infectives (From admission, onward)    Start     Dose/Rate Route Frequency Ordered Stop   06/24/22 1430  ceFAZolin (ANCEF) IVPB 2g/100 mL premix        2 g 200 mL/hr over 30 Minutes Intravenous Every 6 hours 06/24/22 1005 06/24/22 2106   06/24/22 0645  ceFAZolin (ANCEF) IVPB 2g/100 mL premix        2 g 200 mL/hr over 30 Minutes Intravenous On call to O.R. 06/24/22 8250 06/24/22 0829      and started on DVT prophylaxis in the form of Aspirin.   PT and OT were ordered for total joint protocol. Discharge planning consulted to  help with postop disposition and equipment needs.  Patient had a fair night on the evening of surgery. They started to get up OOB with therapy on POD #0. Pt was seen during rounds and was ready to go home pending progress with therapy. He worked with therapy on POD #1 and was meeting his goals. Pt was discharged to home later that day in stable condition.  Diet: Regular diet Activity: WBAT Follow-up: in 2 weeks Disposition: Home Discharged  Condition: stable   Discharge Instructions     Call MD / Call 911   Complete by: As directed    If you experience chest pain or shortness of breath, CALL 911 and be transported to the hospital emergency room.  If you develope a fever above 101 F, pus (white drainage) or increased drainage or redness at the wound, or calf pain, call your surgeon's office.   Change dressing   Complete by: As directed    You have an adhesive waterproof bandage over the incision. Leave this in place until your first follow-up appointment. Once you remove this you will not need to place another bandage.   Constipation Prevention   Complete by: As directed    Drink plenty of fluids.  Prune juice may be helpful.  You may use a stool softener, such as Colace (over the counter) 100 mg twice a day.  Use MiraLax (over the counter) for constipation as needed.   Diet - low sodium heart healthy   Complete by: As directed    Do not sit on low chairs, stoools or toilet seats, as it may be difficult to get up from low surfaces   Complete by: As directed    Driving restrictions   Complete by: As directed    No driving for two weeks   Post-operative opioid taper instructions:   Complete by: As directed    POST-OPERATIVE OPIOID TAPER INSTRUCTIONS: It is important to wean off of your opioid medication as soon as possible. If you do not need pain medication after your surgery it is ok to stop day one. Opioids include: Codeine, Hydrocodone(Norco, Vicodin), Oxycodone(Percocet, oxycontin) and hydromorphone amongst others.  Long term and even short term use of opiods can cause: Increased pain response Dependence Constipation Depression Respiratory depression And more.  Withdrawal symptoms can include Flu like symptoms Nausea, vomiting And more Techniques to manage these symptoms Hydrate well Eat regular healthy meals Stay active Use relaxation techniques(deep breathing, meditating, yoga) Do Not substitute Alcohol to  help with tapering If you have been on opioids for less than two weeks and do not have pain than it is ok to stop all together.  Plan to wean off of opioids This plan should start within one week post op of your joint replacement. Maintain the same interval or time between taking each dose and first decrease the dose.  Cut the total daily intake of opioids by one tablet each day Next start to increase the time between doses. The last dose that should be eliminated is the evening dose.      TED hose   Complete by: As directed    Use stockings (TED hose) for three weeks on both leg(s).  You may remove them at night for sleeping.   Weight bearing as tolerated   Complete by: As directed       Allergies as of 06/25/2022       Reactions   Bee Venom Anaphylaxis   Codeine Nausea And Vomiting   Skelaxin [  metaxalone] Other (See Comments)   Lethargic    Yellow Jacket Venom Anaphylaxis   Augmentin [amoxicillin-pot Clavulanate] Nausea And Vomiting   Sedapap [butalbital-acetaminophen] Other (See Comments)   Unknown reaction    Wellbutrin [bupropion]    Altered mental state        Medication List     STOP taking these medications    aspirin EC 81 MG tablet Replaced by: aspirin 81 MG chewable tablet   fluticasone 50 MCG/ACT nasal spray Commonly known as: FLONASE   HYDROcodone-acetaminophen 7.5-325 MG tablet Commonly known as: NORCO   ibuprofen 200 MG tablet Commonly known as: ADVIL       TAKE these medications    albuterol 108 (90 Base) MCG/ACT inhaler Commonly known as: VENTOLIN HFA Inhale 2 puffs into the lungs every 6 (six) hours as needed for wheezing or shortness of breath.   amoxicillin 875 MG tablet Commonly known as: AMOXIL Take 875 mg by mouth 2 (two) times daily. For sinus infection diagnosed on 05/30/22   aspirin 81 MG chewable tablet Chew 1 tablet (81 mg total) by mouth 2 (two) times daily for 20 days. Then resume one 81 mg aspirin once a day. Replaces:  aspirin EC 81 MG tablet   b complex vitamins capsule Take 1 capsule by mouth daily.   CALCIUM PO Take 1 tablet by mouth daily.   EPINEPHrine 0.3 mg/0.3 mL Soaj injection Commonly known as: EPI-PEN Inject 0.46ml into the muscle as needed for allergic reaction   latanoprost 0.005 % ophthalmic solution Commonly known as: XALATAN Place 1 drop into both eyes daily.   MAGNESIUM GLYCINATE PO Take 1 tablet by mouth every other day.   methocarbamol 500 MG tablet Commonly known as: ROBAXIN Take 1 tablet (500 mg total) by mouth every 6 (six) hours as needed for muscle spasms.   multivitamin tablet Take 1 tablet by mouth daily.   oxyCODONE 5 MG immediate release tablet Commonly known as: Oxy IR/ROXICODONE Take 1-2 tablets (5-10 mg total) by mouth every 6 (six) hours as needed for severe pain.   predniSONE 20 MG tablet Commonly known as: DELTASONE Take 20 mg by mouth daily with breakfast. Started on 05/30/22 for sinus infeciton   VITAMIN C PO Take 1 tablet by mouth daily.               Discharge Care Instructions  (From admission, onward)           Start     Ordered   06/25/22 0000  Weight bearing as tolerated        06/25/22 0734   06/25/22 0000  Change dressing       Comments: You have an adhesive waterproof bandage over the incision. Leave this in place until your first follow-up appointment. Once you remove this you will not need to place another bandage.   06/25/22 0734            Follow-up Information     Ollen Gross, MD. Schedule an appointment as soon as possible for a visit in 2 week(s).   Specialty: Orthopedic Surgery Contact information: [address] [place] [address] [phone]                 Signed: R. Arcola Jansky, PA-C Orthopedic Surgery 06/30/2022, 8:54 AM

## 2022-07-01 ENCOUNTER — Encounter (HOSPITAL_COMMUNITY): Payer: 59

## 2022-07-08 ENCOUNTER — Telehealth: Payer: Self-pay

## 2022-07-08 NOTE — Telephone Encounter (Signed)
Patient called requesting a refill on hydrocodone 7.5-325 mg, take 1 by mouth every 4 hours as needed for moderate or severe pain #180  RX last refilled on 06/03/22  I informed patient that hydrocodone is no longer on his active medication list, as oxycodone was recently filled on 06/25/22 and #42 tablets was dispensed.   Patient states he recently had a hip replacement, Oxycodone was prescribed, and he only took 2 pills, as it makes him feel groggy and is ineffective. Patient states he had a post-op appointment today and they were surprised that he has opted not to take oxycodone.  Patient states he has the remainder of the pills and will gladly bring them to [name], NP if that is what is needed to add his hydrocodone back to the list and refill.  I called [facility] and confirmed that patient was seen today. I requested office notes and was told that the note is not complete, however as soon as the PA signs the note it would be faxed to our office. Phone and fax number was provided to the receptionist, Corrie Dandy.

## 2022-07-09 NOTE — Telephone Encounter (Signed)
Pt needs to continue with ortho for his post-op pain management then we can discuss from there.

## 2022-07-09 NOTE — Telephone Encounter (Signed)
Message was responded to today and I will forward to medical assistant in clinical intake to follow up with patient

## 2022-07-10 NOTE — Telephone Encounter (Signed)
Patient returned call and notified. He verbalized his understanding and agreed.

## 2022-07-13 ENCOUNTER — Telehealth: Payer: Self-pay

## 2022-07-13 NOTE — Telephone Encounter (Signed)
Yes once ortho signs off we will make appt and follow up further. Thank you

## 2022-07-13 NOTE — Telephone Encounter (Addendum)
I spoke with patient and he says that he is requesting medication for his neck pain that he has had for years and that you are aware of. He has made an appointment for tomorrow via mychart. He also stated that he would like to get a call from you today. I informed him I would relay the message,but this is usually not our process.  Message sent to Abbey Chatters, NP

## 2022-07-13 NOTE — Telephone Encounter (Signed)
Patient called requesting refill on hydrocodone 7.5/325mg . Patient is aware that medication is not on his active medication list. Patient states that he needs to know what to do next as he will be out of medication tomorrow. Patient was made aware on 07/10/22 that he is to continue with the ortho for post op pain management.   Message sent to Abbey Chatters, NP

## 2022-07-14 ENCOUNTER — Telehealth (INDEPENDENT_AMBULATORY_CARE_PROVIDER_SITE_OTHER): Payer: 59 | Admitting: Nurse Practitioner

## 2022-07-14 DIAGNOSIS — M542 Cervicalgia: Secondary | ICD-10-CM | POA: Diagnosis not present

## 2022-07-14 DIAGNOSIS — F1193 Opioid use, unspecified with withdrawal: Secondary | ICD-10-CM

## 2022-07-14 DIAGNOSIS — M1612 Unilateral primary osteoarthritis, left hip: Secondary | ICD-10-CM

## 2022-07-14 MED ORDER — HYDROCODONE-ACETAMINOPHEN 7.5-325 MG PO TABS
1.0000 | ORAL_TABLET | Freq: Four times a day (QID) | ORAL | 0 refills | Status: DC | PRN
Start: 2022-07-14 — End: 2022-08-11

## 2022-07-14 NOTE — Progress Notes (Signed)
  This service is provided via telemedicine  No vital signs collected/recorded due to the encounter was a telemedicine visit.   Location of patient (ex: home, work):  Home  Patient consents to a telephone visit:  Yes  Location of the provider (ex: office, home):  Office [place].   Name of any referring provider:  na  Names of all persons participating in the telemedicine service and their role in the encounter:  Hartford Poli, Patient, Nelda Severe, CMA, Abbey Chatters, NP  Time spent on call:  6:59

## 2022-07-14 NOTE — Progress Notes (Signed)
Careteam: Patient Care Team: [name], NP as PCP - General (Geriatric Medicine) Ollen Gross, MD as Consulting Physician (Orthopedic Surgery)  Advanced Directive information Does Patient Have a Medical Advance Directive?: Yes, Type of Advance Directive: Healthcare Power of [place];Living will, Does patient want to make changes to medical advance directive?: No - Patient declined  Allergies  Allergen Reactions   Bee Venom Anaphylaxis   Codeine Nausea And Vomiting   Skelaxin [Metaxalone] Other (See Comments)    Lethargic    Yellow Jacket Venom Anaphylaxis   Augmentin [Amoxicillin-Pot Clavulanate] Nausea And Vomiting   Sedapap [Butalbital-Acetaminophen] Other (See Comments)    Unknown reaction    Wellbutrin [Bupropion]     Altered mental state    Chief Complaint  Patient presents with   Acute Visit    Neck pain Concerns and Pain Medication refill     HPI: Patient is a 62 y.o. male for follow up.  Reports he is having   Reports he is dependant on the hydrocodone and now he is sick.  He reports he is following instructions  He reports he was having a lot of neck pain after surgery.  Reports he was not getting enough pain medication after surgery.  Hip hurt bad for a while but that went away quickly with therapy.   He is having withdrawals from the hydrocodone.  Reports he is hyper aware of his neck pain and very dependant on his neck pain.  No numbness or tingling in hands  Reports muggy and clammy, diarrhea.  Not able to sleep.   He stopped the oxycodone after a few days- had some hydrocodone left- took for 8-10 days felt better- neck didn't hurt during that time, he was able to sleep and then ran out and has only been on oxycodone for the last 3 days. Took last oxycodone at 6 am.   Prior to surgery taking 5 tablet between 8am-10pm   Review of Systems:  Review of Systems  Constitutional:  Positive for chills and malaise/fatigue. Negative for fever and  weight loss.  HENT:  Negative for tinnitus.   Respiratory:  Negative for cough, sputum production and shortness of breath.   Cardiovascular:  Negative for chest pain, palpitations and leg swelling.  Gastrointestinal:  Negative for abdominal pain, constipation, diarrhea and heartburn.  Genitourinary:  Negative for dysuria, frequency and urgency.  Musculoskeletal:  Positive for back pain, joint pain and neck pain. Negative for falls and myalgias.  Skin: Negative.   Neurological:  Positive for tremors and weakness. Negative for dizziness and headaches.  Psychiatric/Behavioral:  Negative for depression and memory loss. The patient is nervous/anxious. The patient does not have insomnia.     Past Medical History:  Diagnosis Date   Anxiety    Arthritis    Asthma    chldhood asthma   BPH (benign prostatic hyperplasia)    Cervicalgia    degenerative changes and foraminal stenoses   Chest pain, unspecified    Depression    Family history of colonic polyps    Father with benign colon polyp   Hepatitis B antibody positive 1984   Hiatal hernia 09/19/2010   History of kidney stones    Hyperglycemia    104 on 10/10/10   Hyperlipidemia    Obstructive sleep apnea (adult) (pediatric)    using CPAP   Other malaise and fatigue    Sinus infection    diagnosed on 05/30/22 by [facility] - tx with amoxicillin and prednisone  Supraventricular premature beats    noted on EKG 05/18/12   Wrist pain, left    Past Surgical History:  Procedure Laterality Date   COLON SURGERY  09/17/2008   poylps/hemorriods   colonoscocpy      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 His smoking use included cigarettes. He has never used smokeless tobacco. He reports that he does not drink  alcohol and does not use drugs.  Family History  Problem Relation Age of Onset   Heart disease Mother    Hypertension Mother    Diabetes Mother    Depression Mother    Cancer Father        2004 bladder cancer   Alcohol abuse Father     Medications: Patient's Medications  New Prescriptions   No medications on file  Previous Medications   ALBUTEROL (VENTOLIN HFA) 108 (90 BASE) MCG/ACT INHALER    Inhale 2 puffs into the lungs every 6 (six) hours as needed for wheezing or shortness of breath.   ASCORBIC ACID (VITAMIN C PO)    Take 1 tablet by mouth daily.   ASPIRIN 81 MG CHEWABLE TABLET    Chew 1 tablet (81 mg total) by mouth 2 (two) times daily for 20 days. Then resume one 81 mg aspirin once a day.   B COMPLEX VITAMINS CAPSULE    Take 1 capsule by mouth daily.   CALCIUM PO    Take 1 tablet by mouth daily.   DULOXETINE (CYMBALTA) 60 MG CAPSULE    TAKE 1 CAPSULE(60 MG) BY MOUTH DAILY   EPINEPHRINE 0.3 MG/0.3 ML IJ SOAJ INJECTION    Inject 0.77ml into the muscle as needed for allergic reaction   LATANOPROST (XALATAN) 0.005 % OPHTHALMIC SOLUTION    Place 1 drop into both eyes daily.   MAGNESIUM GLYCINATE PO    Take 1 tablet by mouth every other day.   MULTIPLE VITAMIN (MULTIVITAMIN) TABLET    Take 1 tablet by mouth daily.   OXYCODONE (OXY IR/ROXICODONE) 5 MG IMMEDIATE RELEASE TABLET    Take 1-2 tablets (5-10 mg total) by mouth every 6 (six) hours as needed for severe pain.  Modified Medications   No medications on file  Discontinued Medications   AMOXICILLIN (AMOXIL) 875 MG TABLET    Take 875 mg by mouth 2 (two) times daily. For sinus infection diagnosed on 05/30/22   METHOCARBAMOL (ROBAXIN) 500 MG TABLET    Take 1 tablet (500 mg total) by mouth every 6 (six) hours as needed for muscle spasms.   PREDNISONE (DELTASONE) 20 MG TABLET    Take 20 mg by mouth daily with breakfast. Started on 05/30/22 for sinus infeciton    Physical Exam:  There were no vitals filed for this visit. There is no  height or weight on file to calculate BMI. Wt Readings from Last 3 Encounters:  06/24/22 192 lb (87.1 kg)  06/01/22 192 lb (87.1 kg)  04/27/22 198 lb (89.8 kg)    Physical Exam Constitutional:      Appearance: Normal appearance.  Neurological:     Mental Status: He is alert and oriented to person, place, and time.     Labs reviewed: Basic Metabolic Panel: Recent Labs    04/27/22 1049 06/18/22 0930 06/25/22 0329  NA 141 137 135  K 4.4 4.3 3.9  CL 107 105 104  CO2 23 23 24   GLUCOSE 95 94 129*  BUN 21 20 21   CREATININE 0.95 0.89 0.88  CALCIUM 8.8 9.6 8.4*   Liver Function Tests: Recent Labs    12/29/21 1140 04/27/22 1049  AST 18 13  ALT 12 12  BILITOT 0.5 0.3  PROT 7.0 6.7   No results for input(s): "LIPASE", "AMYLASE" in the last 8760 hours. No results for input(s): "AMMONIA" in the last 8760 hours. CBC: Recent Labs    12/29/21 1140 04/27/22 1049 05/13/22 0905 06/18/22 0930 06/25/22 0329 06/25/22 1141  WBC 5.4 4.6   < > 5.6 10.4 10.2  NEUTROABS 2,867 2,332  --   --   --   --   HGB 13.3 12.8*   < > 13.6 9.3* 10.2*  HCT 39.4 38.1*   < > 43.0 29.2* 32.0*  MCV 83.7 81.8   < > 85.7 85.9 86.5  PLT 231 245   < > 210 176 196   < > = values in this interval not displayed.   Lipid Panel: Recent Labs    12/29/21 1140 04/27/22 1049  CHOL 222* 202*  HDL 68 58  LDLCALC 137* 122*  TRIG 78 113  CHOLHDL 3.3 3.5   TSH: No results for input(s): "TSH" in the last 8760 hours. A1C: Lab Results  Component Value Date   HGBA1C 5.5 02/01/2020     Assessment/Plan 1. Opioid withdrawal He was taken off hydrocodone and placed on oxycodone after surgery but after not having hydrocodone starting having increase in pain in neck, leg, increase in agitation, tremors and unable to sleep.  -will restart but he will reduce frequency of medication and we will slowly wean off medication to avoid further withdrawal symptoms  - HYDROcodone-acetaminophen (NORCO) 7.5-325 MG  tablet; Take 1 tablet by mouth 4 (four) times daily as needed for moderate pain.  Dispense: 120 tablet; Refill: 0  2. Cervicalgia Worsening neck pain, has not had this evaluated recently. Will restart hydrocodone- apap as this was helping control pain - HYDROcodone-acetaminophen (NORCO) 7.5-325 MG tablet; Take 1 tablet by mouth 4 (four) times daily as needed for moderate pain.  Dispense: 120 tablet; Refill: 0 - DG Cervical Spine Complete; Future Would also benefit from therapy as well- will get xray first  3. Primary osteoarthritis of left hip S/p hip replacement and pain has been minimal and well tolerated.    Next appt: 1 month for follow up James [name]  [facility] [phone]    Virtual Visit via Earleen Reaper, video  I connected with patient on 07/14/22 at  8:20 AM EDT by video  and verified that I am speaking with the correct person using two identifiers.  Location: Patient: home Provider: [place]    I discussed the limitations, risks, security and privacy concerns of performing an evaluation and management service by telephone and the availability of in person appointments. I also discussed with the patient that there may be a patient responsible charge related to this service. The patient expressed understanding and agreed to proceed.   I discussed the assessment and treatment plan with the patient. The patient was provided an opportunity to ask questions and all were answered. The patient agreed with the plan and demonstrated an understanding of the instructions.   The patient was advised to call back or seek an in-person evaluation if the symptoms worsen or if  the condition fails to improve as anticipated.  I provided 30 minutes of non-face-to-face time during this encounter.  James [name]. [name] printed and mailed

## 2022-07-23 ENCOUNTER — Ambulatory Visit (INDEPENDENT_AMBULATORY_CARE_PROVIDER_SITE_OTHER): Payer: 59 | Admitting: Neurology

## 2022-07-23 ENCOUNTER — Encounter: Payer: Self-pay | Admitting: Neurology

## 2022-07-23 ENCOUNTER — Telehealth: Payer: Self-pay | Admitting: Neurology

## 2022-07-23 VITALS — BP 105/66 | HR 92 | Ht 72.0 in | Wt 197.0 lb

## 2022-07-23 DIAGNOSIS — G4733 Obstructive sleep apnea (adult) (pediatric): Secondary | ICD-10-CM | POA: Diagnosis not present

## 2022-07-23 DIAGNOSIS — Z9989 Dependence on other enabling machines and devices: Secondary | ICD-10-CM | POA: Insufficient documentation

## 2022-07-23 NOTE — Progress Notes (Signed)
Provider:  Melvyn Novas, MD  Primary Care Physician:  [name], NP [address] [place] [address]     Referring Provider: [name], Np [address] [place],  [place] [address]          Chief Complaint according to patient   Patient presents with:     New Patient (Initial Visit)           HISTORY OF PRESENT ILLNESS:  James [name] is a 62 y.o. male patient who is here for revisit 07/23/2022 for  CPAP compliance , He is our patient since 2011 at [facility] .   07-23-2022: here without concerns regarding sleep. Has seasonal allergies, but not wheezing. Marland Kitchen Has been on cymbalta for ? Depression or anxiety? Pain was treated with opiates 6 months ago until surgery. He had a hip replacement 4 weeks ago, is pain free.   CPAP  compliance for [name]  has been excellent 97% by days and hours -so on average 9 and half hours at night.   He is using his new AutoSet  with a setting between 5 and 13 cm water pressure with 3 cm expiratory relief function and achieves a residual AHI of only 1.6/h.   This is a great reduction his 95th percentile pressure is close to 11 cm water air leakage is moderate and may have something to do with facial hair and mask fit.  The 95th percentile air leak was 27 L a minute.  No central apneas arising. He has nocturia 2 times at night.     07-10-2021: RV with new CPAP, after HST confirmed apnea.  This HST confirms the presence of moderate sleep apnea that did not vary much between REM and non-REM sleep and was only significant in prone sleep or supine sleep position.   HST : The apnea-hypopnea index was 18.9/h and during REM sleep slightly lower than in non-REM sleep. Non-REM sleep AHI was 19.6 and REM sleep AHI 16.1/h.                                       Positional AHI: A supine AHI was seen at 16.4/h in prone sleep which the patient seems to prefer the AHI was 29.6/h.  On the right and left side the AHI  was under 10.RECOMMENDATION: The patient should avoid prone and supine position as both were associated with higher apnea and disease.  Since he has been a compliant CPAP user at 8 cm water and feels good with his CPAP , I will order a new machine for him - an auto titration device with CPAP function between 5 and 13 cmH2O.  2 cm EPR.   He has remained 100% compliant by days and time of use, residual AHI 1.9/h.  Airleak  95% 32.7/h. nasal interface.  95%  pressure 11 cm water.   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       02-03-2021 Rv with need for a new CPAP machine his is 62 years old.  He is highly compliant and has noted a sound that is concerning.  Residual AHI is 1.6/h. Last sleep study was 2015, I will order a retest by  HST.      12-26-2018, 62 year old caucasian , right handed Male patient ,James [name]. situational depressed.  GDS 12/ 15 point. [name] has is usually has high compliance with CPAP use with an average of 93% equal to 7 hours and 45 minutes at night.  CPAP is an air sense 10 AutoSet currently with a setting of 8 cm water pressure to centimeter EPR and a residual AHI of 1.4 which is an excellent resolution there are some airleak data however that seemed not to influence the apnea index.  I think the high current endorsement of the geriatric depression scale is more of a reactive situational score the fatigue severity is endorsed at 12 out of 61 years and would be much slightly higher if a true clinical depression would be present.  Epworth sleepiness course endorsed at 1 out of 24 points.       Follow up for this 61 year old caucasian male patient on CPAP.  12-21-2017, I have the pleasure of meeting [name] today for his yearly CPAP compliance visit.  He has used a CPAP 80% for the last month but has been a gap of 3 days which is highly  unusual and his average user time on days used is 7 hours and 45 minutes, he has a very low residual AHI of 1.4/h of sleep, set at 8 cmH2O was to submit a EPR, there are some high air leaks.  I have always felt that these were related to facial hair but he is using a nasal pillow.   12-16-2016 He reports from his travel adventure in [place]. James [name] just returned from a trip to [place], [name], with an average user time currently on days used of 8 hours and 40 minutes. He used to machine 18 out of 30 days again this was during his travel time- he used his old CPAP as his travel CPAP ( OSA treated since 2002). CPAP is set at 8:30 meter water with 3 cm EPR residual AHI is only 1.8 there are some higher air leaks probably attributable to his facial hair. That needs to be no change in settings. Epworth sleepiness score is 2 points fatigue severity 12 points also no concern from the side. I would like for him to continue using CPAP nightly.       Referral from Dr. Janyth Contes for a sleep consultation. JamesRoy is a right-handed Caucasian gentleman who underwent a sleep study on 08-23-09 at [place] [facility],  which documented an AHI of 22.3. His primary care physician had referred him after her the patient was to hold during a camping trip that fellow travelers had noticed him to snore loudly and have frequent apneas. He also has severe restless legs. He returned for a titration study on 11-22-2009 and a respironics nasal pillow in  medium size was used to. The patient was titrated to 8 cm water, at which pressure his AHI current of 0.0. He was able to sleep under 10 minutes under the pressure. Some snoring was still noted but to a much lower volume.  Bisacodyl has been compliant he using her CPAP ever since and for the first time today I see her download.  He had been lost in followup also he tried to make an appointment with our  sleep clinic.  The CPAP download dated 11-05-13 , encompassed  90 days,  an average daily user time of 7 hours and 39 minutes , 100% compliance of 4 hours. The machine is set at 8 cm of water without EPR and his residual AHI is 1.6. In spite of being on CPAP, he cannot sleep in the same bed with his partner, reporting RLS.  It seems, he has not RLS, but PLM s, bothering his partner.  The patient works at night, goes to bed at 1.30 AM and once he puts his CPAP on, he is promptly asleep. He never needs an alarm clock to rise. Sleeps from 1.45 to 9.30 and mainly uninterrputed, only one nocturia.  Work starts at 2.30 PM.  He drinks sweet iced tea, mainly water.    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 He is 100% compliant with the use of CPAP over 4 hours nightly on a 30 day download obtained in office today. Average user time is 7 hours 59 minutes set pressure is 8 cm water without EPR and AHI is 1.9. He does have some air leaks but this is related to his facial hair. Always some Epworth sleepiness score is endorsed at 0. Fatigue severity is endorsed at 0. The patient's partner sleeps in a different room actually on a different floor of the house. Was begun but the patient was still snoring and kicking a lot at night, so he is not able to bear witness to how the sleep habits have changed. A she uses a 62 year old ResMed machine and he would like to have a more trouble friendly option available lighter and perhaps quieter. The patient has noticed that the machine is noisier than it used to be. He would also like to obtain a car [employer] to be able to camp and uses CPAP machine. In addition he travels long distances and would like to have a CPAP machine that would be usable on a  Plane. 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      Review of Systems: Out of  a complete 14 system review, the patient complains of only the following symptoms, and all other reviewed systems are negative.:  Fatigue, sleepiness , snoring, fragmented sleep,    How likely are you to doze in the following situations: 0 = not likely, 1 = slight chance, 2 = moderate chance, 3 = high chance   Sitting and Reading? Watching Television? Sitting inactive in a public place (theater or meeting)? As a passenger in a car for an hour without a break? Lying down in the afternoon when circumstances permit? Sitting and talking to someone? Sitting quietly after lunch without alcohol? In a car, while stopped for a few minutes in traffic?   Total = 0/ 24 points   FSS endorsed at 11/ 63 points.  GDS 0/ 15   Social History   Socioeconomic History   Marital status: Married    Spouse name: [name]   Number of children: 0   Years of education: 13   Highest education level: Not on file  Occupational History   Occupation: Marine scientist: [employer]  Tobacco Use   Smoking status: Former    Types: Cigarettes   Smokeless tobacco: Never   Tobacco comments:    Quit 25 years ago as of 2024  Vaping Use   Vaping Use: Never used  Substance and Sexual Activity   Alcohol use: Never   Drug use: No   Sexual activity: Not on file  Other Topics Concern   Not on file  Social History Narrative   Patient is married ([name]) and lives at home with his husband.   Patient has a college education.   Patient works full-time ([employer]).   Patient is right-handed.   Patient drinks one cup of coffee daily.   Social Determinants of Health   Financial Resource Strain: Not on file  Food Insecurity: No Food Insecurity (06/24/2022)   Hunger Vital Sign    Worried About Running Out of Food in the Last Year: Never true    Ran Out of Food in the Last Year: Never true  Transportation Needs: No Transportation Needs (06/24/2022)   PRAPARE - Scientist, research (physical sciences) (Medical): No    Lack of Transportation (Non-Medical): No  Physical Activity: Not on file  Stress: Not on file  Social Connections: Not on file    Family History  Problem Relation Age of Onset   Heart disease Mother    Hypertension Mother    Diabetes Mother    Depression Mother    Cancer Father        2004 bladder cancer   Alcohol abuse Father     Past Medical History:  Diagnosis Date   Anxiety    Arthritis    Asthma    chldhood asthma   BPH (benign prostatic hyperplasia)    Cervicalgia    degenerative changes and foraminal stenoses   Chest pain, unspecified    Depression    Family history of colonic polyps    Father with benign colon polyp   Hepatitis B antibody positive 1984   Hiatal hernia 09/19/2010   History of kidney stones    Hyperglycemia    104 on 10/10/10   Hyperlipidemia    Obstructive sleep apnea (adult) (pediatric)    using CPAP   Other malaise and fatigue    Sinus infection    diagnosed on 05/30/22 by [facility] - tx with amoxicillin and prednisone   Supraventricular premature beats    noted on EKG 05/18/12   Wrist pain, left     Past Surgical History:  Procedure Laterality Date   COLON SURGERY  09/17/2008   poylps/hemorriods   colonoscocpy      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 8 g 2   Ascorbic Acid (VITAMIN C PO) Take 1 tablet by mouth daily.     b complex vitamins capsule Take 1 capsule by mouth daily.     CALCIUM PO Take 1 tablet by mouth daily.     DULoxetine (CYMBALTA) 60 MG capsule TAKE 1 CAPSULE(60 MG) BY  MOUTH DAILY 90 capsule 3   EPINEPHrine 0.3 mg/0.3 mL IJ SOAJ injection Inject 0.61ml into the muscle as needed for allergic reaction 2 each 1   HYDROcodone-acetaminophen (NORCO) 7.5-325 MG tablet Take 1 tablet by mouth 4 (four) times daily as needed for moderate pain. 120 tablet 0  latanoprost (XALATAN) 0.005 % ophthalmic solution Place 1 drop into both eyes daily.     MAGNESIUM GLYCINATE PO Take 1 tablet by mouth every other day.     Multiple Vitamin (MULTIVITAMIN) tablet Take 1 tablet by mouth daily.     No current facility-administered medications on file prior to visit.    Allergies  Allergen Reactions   Bee Venom Anaphylaxis   Codeine Nausea And Vomiting   Skelaxin [Metaxalone] Other (See Comments)    Lethargic    Yellow Jacket Venom Anaphylaxis   Augmentin [Amoxicillin-Pot Clavulanate] Nausea And Vomiting   Sedapap [Butalbital-Acetaminophen] Other (See Comments)    Unknown reaction    Wellbutrin [Bupropion]     Altered mental state     DIAGNOSTIC DATA (LABS, IMAGING, TESTING) - I reviewed patient records, labs, notes, testing and imaging myself where available.  Lab Results  Component Value Date   WBC 10.2 06/25/2022   HGB 10.2 (L) 06/25/2022   HCT 32.0 (L) 06/25/2022   MCV 86.5 06/25/2022   PLT 196 06/25/2022      Component Value Date/Time   NA 135 06/25/2022 0329   NA 140 08/07/2015 1009   K 3.9 06/25/2022 0329   CL 104 06/25/2022 0329   CO2 24 06/25/2022 0329   GLUCOSE 129 (H) 06/25/2022 0329   BUN 21 06/25/2022 0329   BUN 16 08/07/2015 1009   CREATININE 0.88 06/25/2022 0329   CREATININE 0.95 04/27/2022 1049   CALCIUM 8.4 (L) 06/25/2022 0329   PROT 6.7 04/27/2022 1049   PROT 6.3 08/07/2015 1009   ALBUMIN 4.0 11/02/2016 1512   ALBUMIN 4.1 08/07/2015 1009   AST 13 04/27/2022 1049   ALT 12 04/27/2022 1049   ALKPHOS 64 11/02/2016 1512   BILITOT 0.3 04/27/2022 1049   BILITOT 0.3 08/07/2015 1009   GFRNONAA >60 06/25/2022 0329   GFRNONAA 80 02/01/2020 1028    GFRAA 93 02/01/2020 1028   Lab Results  Component Value Date   CHOL 202 (H) 04/27/2022   HDL 58 04/27/2022   LDLCALC 122 (H) 04/27/2022   TRIG 113 04/27/2022   CHOLHDL 3.5 04/27/2022   Lab Results  Component Value Date   HGBA1C 5.5 02/01/2020   No results found for: "VITAMINB12" Lab Results  Component Value Date   TSH 0.90 11/02/2016    PHYSICAL EXAM:  Today's Vitals   07/23/22 1311  BP: 105/66  Pulse: 92  Weight: 197 lb (89.4 kg)  Height: 6' (1.829 m)   Body mass index is 26.72 kg/m.   Wt Readings from Last 3 Encounters:  07/23/22 197 lb (89.4 kg)  06/24/22 192 lb (87.1 kg)  06/01/22 192 lb (87.1 kg)     Ht Readings from Last 3 Encounters:  07/23/22 6' (1.829 m)  06/24/22 6' (1.829 m)  06/01/22 6' (1.829 m)      General: The patient is awake, alert and appears not in acute distress.  No interval medical problem.  The patient is well groomed. Head: Normocephalic, atraumatic. Neck is supple. Mallampati 2   neck circumference: 15 inches . Nasal airflow restricted, mustache, TMJ is evident. Retrognathia is not seen.  Cardiovascular: Regular rate and rhythm , without murmurs or carotid bruit, and without distended neck veins. Respiratory: Lungs are clear to auscultation. Skin:  Without evidence of edema or rash. Trunk: BMI is elevated and patient has normal posture.   Neurologic exam : The patient is awake and alert, oriented to place and time.   Memory subjective described as intact.  Cranial nerves:no loss of smell and tatste.  Pupils are equal and briskly reactive to light.  Extraocular movements  in vertical and horizontal planes intact and without nystagmus.  Facial motor strength is symmetric wiht tongue and uvula moving in midline. Motor exam: Normal tone  muscle bulk and symmetric, strength in all extremities. Sensory: Fine touch, pinprick and vibration were tested in all extremities.        ASSESSMENT AND PLAN 62 y.o. year old male  here  with: He is using his new AutoSet  with a setting between 5 and 13 cm water pressure with 3 cm expiratory relief function and achieves a residual AHI of only 1.6/h.   This is a great reduction his 95th percentile pressure is close to 11 cm water air leakage is moderate and may have something to do with facial hair and mask fit.  The 95th percentile air leak was 27 L a minute.  No central apneas arising. He has nocturia 2 times at night.     He wants to go camping, wild, and I suggested a dental device for this limited use.  He asked about inspire, he has a BMI of 26.7 , has OSA,  non REM dependent.  No hypoxia.     1)  Referral to Dr. for sleep dentistry.  He is interested in an immediate referral and this will only replace CPAP on camping trips.    I plan to follow up either personally or through our NP within 10 months.   I would like to thank [name], NP and [name], Np [address] [place],  [place] [address] for allowing me to meet with and to take care of this pleasant patient.    After spending a total time of  35  minutes face to face and additional time for physical and neurologic examination, review of laboratory studies,  personal review of imaging studies, reports and results of other testing and review of referral information / records as far as provided in visit,   Electronically signed by: Melvyn Novas, MD 07/23/2022 1:46 PM  [facility] Board certified by The [employer] of Sleep Medicine and Diplomate of the [employer] of Sleep Medicine. Board certified In Neurology through the ABPN, Fellow of the [employer] of Neurology. Medical Director of [facility].

## 2022-07-23 NOTE — Telephone Encounter (Signed)
Referral sent to [facility]: Phone [phone] Fax: [phone]

## 2022-08-11 ENCOUNTER — Encounter: Payer: Self-pay | Admitting: Nurse Practitioner

## 2022-08-11 ENCOUNTER — Telehealth: Payer: Self-pay | Admitting: *Deleted

## 2022-08-11 ENCOUNTER — Telehealth: Payer: 59 | Admitting: Nurse Practitioner

## 2022-08-11 DIAGNOSIS — M542 Cervicalgia: Secondary | ICD-10-CM | POA: Diagnosis not present

## 2022-08-11 DIAGNOSIS — F1120 Opioid dependence, uncomplicated: Secondary | ICD-10-CM

## 2022-08-11 MED ORDER — HYDROCODONE-ACETAMINOPHEN 7.5-325 MG PO TABS
1.0000 | ORAL_TABLET | Freq: Four times a day (QID) | ORAL | 0 refills | Status: DC | PRN
Start: 2022-08-11 — End: 2022-09-11

## 2022-08-11 NOTE — Progress Notes (Signed)
  This service is provided via telemedicine  No vital signs collected/recorded due to the encounter was a telemedicine visit.   Location of patient (ex: home, work):  Home  Patient consents to a telephone visit:  Yes  Location of the provider (ex: office, home):  Office [place].   Name of any referring provider:  na  Names of all persons participating in the telemedicine service and their role in the encounter:  James [name],Patient, [name], CMA, Abbey Chatters, NP  Time spent on call:  6:47

## 2022-08-11 NOTE — Progress Notes (Signed)
Careteam: Patient Care Team: [name], NP as PCP - General (Geriatric Medicine) Ollen Gross, MD as Consulting Physician (Orthopedic Surgery)  Advanced Directive information Does Patient Have a Medical Advance Directive?: Yes, Type of Advance Directive: Healthcare Power of [place];Living will, Does patient want to make changes to medical advance directive?: No - Patient declined  Allergies  Allergen Reactions   Bee Venom Anaphylaxis   Codeine Nausea And Vomiting   Skelaxin [Metaxalone] Other (See Comments)    Lethargic    Yellow Jacket Venom Anaphylaxis   Augmentin [Amoxicillin-Pot Clavulanate] Nausea And Vomiting   Sedapap [Butalbital-Acetaminophen] Other (See Comments)    Unknown reaction    Wellbutrin [Bupropion]     Altered mental state    Chief Complaint  Patient presents with   Acute Visit    Follow up Pain Medication.      HPI: Patient is a 62 y.o. male for follow on pain.  Reports he is having a difficult time with the transitioning to less pain medication.  He has had one less tablet daily and it has been hard.  He will run out early.  He is cutting pills in half and trying to spread them out.  Would be interested in seeing spine specialist   He is still in pain, he tried to reduced pain medication and then had to play catch up.  He still feels abandoned after his hip surgery.  He never expected such great pain relief from the hips surgery. He is able to move around well.   Anxiety- has been well controlled on cymbalta.   Review of Systems:  Review of Systems  Constitutional:  Positive for weight loss (moving more). Negative for chills and fever.  Musculoskeletal:  Positive for myalgias and neck pain.  Psychiatric/Behavioral:  The patient is not nervous/anxious.     Past Medical History:  Diagnosis Date   Anxiety    Arthritis    Asthma    chldhood asthma   BPH (benign prostatic hyperplasia)    Cervicalgia    degenerative changes and  foraminal stenoses   Chest pain, unspecified    Depression    Family history of colonic polyps    Father with benign colon polyp   Hepatitis B antibody positive 1984   Hiatal hernia 09/19/2010   History of kidney stones    Hyperglycemia    104 on 10/10/10   Hyperlipidemia    Obstructive sleep apnea (adult) (pediatric)    using CPAP   Other malaise and fatigue    Sinus infection    diagnosed on 05/30/22 by cpcp - tx with amoxicillin and prednisone   Supraventricular premature beats    noted on EKG 05/18/12   Wrist pain, left    Past Surgical History:  Procedure Laterality Date   COLON SURGERY  09/17/2008   poylps/hemorriods   colonoscocpy      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 His smoking use included cigarettes. He has never used smokeless tobacco. He reports that he does not drink alcohol and does not use drugs.  Family History  Problem Relation Age of Onset   Heart disease Mother    Hypertension Mother    Diabetes Mother    Depression Mother    Cancer  Father        2004 bladder cancer   Alcohol abuse Father     Medications: Patient's Medications  New Prescriptions   No medications on file  Previous Medications   ALBUTEROL (VENTOLIN HFA) 108 (90 BASE) MCG/ACT INHALER    Inhale 2 puffs into the lungs every 6 (six) hours as needed for wheezing or shortness of breath.   ASCORBIC ACID (VITAMIN C PO)    Take 1 tablet by mouth daily.   B COMPLEX VITAMINS CAPSULE    Take 1 capsule by mouth daily.   CALCIUM PO    Take 1 tablet by mouth daily.   DULOXETINE (CYMBALTA) 60 MG CAPSULE    TAKE 1 CAPSULE(60 MG) BY MOUTH DAILY   EPINEPHRINE 0.3 MG/0.3 ML IJ SOAJ INJECTION    Inject 0.53ml into the muscle as needed for allergic reaction    HYDROCODONE-ACETAMINOPHEN (NORCO) 7.5-325 MG TABLET    Take 1 tablet by mouth 4 (four) times daily as needed for moderate pain.   LATANOPROST (XALATAN) 0.005 % OPHTHALMIC SOLUTION    Place 1 drop into both eyes daily.   MAGNESIUM GLYCINATE PO    Take 1 tablet by mouth every other day.   MULTIPLE VITAMIN (MULTIVITAMIN) TABLET    Take 1 tablet by mouth daily.  Modified Medications   No medications on file  Discontinued Medications   No medications on file    Physical Exam:  There were no vitals filed for this visit. There is no height or weight on file to calculate BMI. Wt Readings from Last 3 Encounters:  07/23/22 197 lb (89.4 kg)  06/24/22 192 lb (87.1 kg)  06/01/22 192 lb (87.1 kg)    Physical Exam Constitutional:      Appearance: Normal appearance.  Neurological:     Mental Status: He is alert. Mental status is at baseline.  Psychiatric:        Mood and Affect: Mood normal.     Labs reviewed: Basic Metabolic Panel: Recent Labs    04/27/22 1049 06/18/22 0930 06/25/22 0329  NA 141 137 135  K 4.4 4.3 3.9  CL 107 105 104  CO2 23 23 24   GLUCOSE 95 94 129*  BUN 21 20 21   CREATININE 0.95 0.89 0.88  CALCIUM 8.8 9.6 8.4*   Liver Function Tests: Recent Labs    12/29/21 1140 04/27/22 1049  AST 18 13  ALT 12 12  BILITOT 0.5 0.3  PROT 7.0 6.7   No results for input(s): "LIPASE", "AMYLASE" in the last 8760 hours. No results for input(s): "AMMONIA" in the last 8760 hours. CBC: Recent Labs    12/29/21 1140 04/27/22 1049 05/13/22 0905 06/18/22 0930 06/25/22 0329 06/25/22 1141  WBC 5.4 4.6   < > 5.6 10.4 10.2  NEUTROABS 2,867 2,332  --   --   --   --   HGB 13.3 12.8*   < > 13.6 9.3* 10.2*  HCT 39.4 38.1*   < > 43.0 29.2* 32.0*  MCV 83.7 81.8   < > 85.7 85.9 86.5  PLT 231 245   < > 210 176 196   < > = values in this interval not displayed.   Lipid Panel: Recent Labs    12/29/21 1140 04/27/22 1049  CHOL 222* 202*  HDL 68 58  LDLCALC 137* 122*  TRIG 78  113  CHOLHDL 3.3 3.5   TSH: No results for input(s): "TSH" in the last 8760 hours. A1C: Lab Results  Component Value  Date   HGBA1C 5.5 02/01/2020     Assessment/Plan 1. Cervicalgia Worsening neck pain, plans to get xray of cervical spine. Discussed referral to spine specialist but wants to hold off at this time due to recent hip replacement.  - HYDROcodone-acetaminophen (NORCO) 7.5-325 MG tablet; Take 1 tablet by mouth 4 (four) times daily as needed for moderate pain.  Dispense: 120 tablet; Refill: 0  2. Uncomplicated opioid dependence (HCC) Working towards reduction of pain medication, he is dealing with the physical and emotional aspects.   Next appt: 2 months- he will call back and make appt in July.  Janene Harvey. Biagio Borg  [facility] [phone]    Virtual Visit via Earleen Reaper  I connected with patient on 08/11/22 at  9:00 AM EDT by video and verified that I am speaking with the correct person using two identifiers.  Location: Patient: home Provider: [facility]    I discussed the limitations, risks, security and privacy concerns of performing an evaluation and management service by telephone and the availability of in person appointments. I also discussed with the patient that there may be a patient responsible charge related to this service. The patient expressed understanding and agreed to proceed.   I discussed the assessment and treatment plan with the patient. The patient was provided an opportunity to ask questions and all were answered. The patient agreed with the plan and demonstrated an understanding of the instructions.   The patient was advised to call back or seek an in-person evaluation if the symptoms worsen or if the condition fails to improve as anticipated.  I provided 20 minutes of non-face-to-face time during this encounter.  Janene Harvey. Biagio Borg Avs printed and mailed

## 2022-08-11 NOTE — Telephone Encounter (Signed)
[name], etris are scheduled for a virtual visit with your provider today.    Just as we do with appointments in the office, we must obtain your consent to participate.  Your consent will be active for this visit and any virtual visit you [name] have with one of our providers in the next 365 days.    If you have a MyChart account, I can also send a copy of this consent to you electronically.  All virtual visits are billed to your insurance company just like a traditional visit in the office.  As this is a virtual visit, video technology does not allow for your provider to perform a traditional examination.  This [name] limit your provider's ability to fully assess your condition.  If your provider identifies any concerns that need to be evaluated in person or the need to arrange testing such as labs, EKG, etc, we will make arrangements to do so.    Although advances in technology are sophisticated, we cannot ensure that it will always work on either your end or our end.  If the connection with a video visit is poor, we [name] have to switch to a telephone visit.  With either a video or telephone visit, we are not always able to ensure that we have a secure connection.   I need to obtain your verbal consent now.   Are you willing to proceed with your visit today?   James [name] has provided verbal consent on 08/11/2022 for a virtual visit (video or telephone).   [name], [place] 08/11/2022  8:42 AM

## 2022-08-28 ENCOUNTER — Ambulatory Visit: Payer: 59 | Admitting: Nurse Practitioner

## 2022-09-08 ENCOUNTER — Ambulatory Visit
Admission: RE | Admit: 2022-09-08 | Discharge: 2022-09-08 | Disposition: A | Discharge status: Home / Self Care | Payer: 59 | Source: Ambulatory Visit | Attending: Nurse Practitioner | Admitting: Nurse Practitioner

## 2022-09-08 DIAGNOSIS — M542 Cervicalgia: Secondary | ICD-10-CM

## 2022-09-11 ENCOUNTER — Other Ambulatory Visit: Payer: Self-pay

## 2022-09-11 DIAGNOSIS — M542 Cervicalgia: Secondary | ICD-10-CM

## 2022-09-11 MED ORDER — HYDROCODONE-ACETAMINOPHEN 7.5-325 MG PO TABS
1.0000 | ORAL_TABLET | Freq: Four times a day (QID) | ORAL | 0 refills | Status: DC | PRN
Start: 2022-09-11 — End: 2022-10-12

## 2022-10-12 ENCOUNTER — Other Ambulatory Visit: Payer: Self-pay | Admitting: *Deleted

## 2022-10-12 DIAGNOSIS — M542 Cervicalgia: Secondary | ICD-10-CM

## 2022-10-12 MED ORDER — HYDROCODONE-ACETAMINOPHEN 7.5-325 MG PO TABS
1.0000 | ORAL_TABLET | Freq: Four times a day (QID) | ORAL | 0 refills | Status: DC | PRN
Start: 2022-10-12 — End: 2022-10-20

## 2022-10-12 NOTE — Telephone Encounter (Signed)
Patient requested Refill.  EPIC LR: 09/11/2022 Contract Date: 10/25/2020  Pended Rx and sent to [facility] due to Shanda Bumps out of office.

## 2022-10-12 NOTE — Telephone Encounter (Signed)
Patient called and stated that he wanted to let you know that he is Completely out of his medication.

## 2022-10-12 NOTE — Telephone Encounter (Signed)
Norco refilled

## 2022-10-20 ENCOUNTER — Encounter: Payer: Self-pay | Admitting: Nurse Practitioner

## 2022-10-20 ENCOUNTER — Telehealth (INDEPENDENT_AMBULATORY_CARE_PROVIDER_SITE_OTHER): Payer: 59 | Admitting: Nurse Practitioner

## 2022-10-20 DIAGNOSIS — M542 Cervicalgia: Secondary | ICD-10-CM | POA: Diagnosis not present

## 2022-10-20 MED ORDER — HYDROCODONE-ACETAMINOPHEN 7.5-325 MG PO TABS
1.0000 | ORAL_TABLET | ORAL | 0 refills | Status: DC | PRN
Start: 2022-10-20 — End: 2022-10-20

## 2022-10-20 MED ORDER — HYDROCODONE-ACETAMINOPHEN 7.5-325 MG PO TABS: 1.0000 | ORAL_TABLET | ORAL | Status: DC | PRN

## 2022-10-20 NOTE — Progress Notes (Signed)
Careteam: Patient Care Team: [name], NP as PCP - General (Geriatric Medicine) Ollen Gross, MD as Consulting Physician (Orthopedic Surgery)  Advanced Directive information Does Patient Have a Medical Advance Directive?: Yes, Type of Advance Directive: Healthcare Power of [place];Living will, Does patient want to make changes to medical advance directive?: No - Patient declined  Allergies  Allergen Reactions   Bee Venom Anaphylaxis   Codeine Nausea And Vomiting   Skelaxin [Metaxalone] Other (See Comments)    Lethargic    Yellow Jacket Venom Anaphylaxis   Augmentin [Amoxicillin-Pot Clavulanate] Nausea And Vomiting   Sedapap [Butalbital-Acetaminophen] Other (See Comments)    Unknown reaction    Wellbutrin [Bupropion]     Altered mental state    Chief Complaint  Patient presents with   Acute Visit    Wants to discuss X-Ray Results he had in June   Quality Metric Gaps    To discuss need for Colonoscopy and Covid Vaccine.      HPI: Patient is a 62 y.o. male for follow up. Had COVID but did not have any symptoms, tested because his partner had it.   He is breaking his medication in half but hurting more and more with decrease in frequency. Only hurting in his neck.  He taking 1 pill as soon as he gets up around 7 am, then he takes another whole tablet around 11 am by 2 or 3 he is hurting again. Then he feels like he needs another tablet in the evening  If he turns a certain way at night he will wake up in pain.  When he only takes half he only gets 30 min-1 hour worth of pain relief.  He tries to maintain active so if he has pain he takes another medication to remain active or else he has to sit down due to pain.  One day he took half tablets all day but he felt like he was constantly behind in pain.   DG Cervical Spine Complete  Result Date: 09/12/2022 CLINICAL DATA:  Neck pain for years. EXAM: CERVICAL SPINE - COMPLETE 4+ VIEW COMPARISON:  None Available.  FINDINGS: Straightening of normal lordosis. Trace anterolisthesis of C4 on C5. Degenerative disc disease C5-C6 and C6-C7 with disc space narrowing and spurring. Mild multilevel facet hypertrophy. Posterior spurring at C5-C6 on the right and C6-C7 on the left cause mild neural foraminal stenosis. No evidence of acute or prior fracture. The lateral masses of C1 are well aligned on C2. no obvious bony destructive change or focal lesion. No prevertebral soft tissue thickening. The lung apices are clear. IMPRESSION: 1. Degenerative disc disease at C5-C6 and C6-C7. Mild multilevel facet hypertrophy. 2. Posterior spurring at C5-C6 on the right and C6-C7 on the left cause mild neural foraminal stenosis. Electronically Signed   By: Narda Rutherford M.D.   On: 09/12/2022 15:58    Had MRI a long time ago and showed stenosis. He never saw a specialist.  No radicular pain. Somewhat worse in his shoulder.  Crunchy feeling.  Worse overnight.    Review of Systems:  Review of Systems  Musculoskeletal:  Positive for myalgias and neck pain. Negative for back pain, falls and joint pain.    Past Medical History:  Diagnosis Date   Anxiety    Arthritis    Asthma    chldhood asthma   BPH (benign prostatic hyperplasia)    Cervicalgia    degenerative changes and foraminal stenoses   Chest pain, unspecified  Depression    Family history of colonic polyps    Father with benign colon polyp   Hepatitis B antibody positive 1984   Hiatal hernia 09/19/2010   History of kidney stones    Hyperglycemia    104 on 10/10/10   Hyperlipidemia    Obstructive sleep apnea (adult) (pediatric)    using CPAP   Other malaise and fatigue    Sinus infection    diagnosed on 05/30/22 by cpcp - tx with amoxicillin and prednisone   Supraventricular premature beats    noted on EKG 05/18/12   Wrist pain, left    Past Surgical History:  Procedure Laterality Date   COLON SURGERY  09/17/2008   poylps/hemorriods   colonoscocpy       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 His smoking use included cigarettes. He has never used smokeless tobacco. He reports that he does not drink alcohol and does not use drugs.  Family History  Problem Relation Age of Onset   Heart disease Mother    Hypertension Mother    Diabetes Mother    Depression Mother    Cancer Father        2004 bladder cancer   Alcohol abuse Father     Medications: Patient's Medications  New Prescriptions   No medications on file  Previous Medications   ALBUTEROL (VENTOLIN HFA) 108 (90 BASE) MCG/ACT INHALER    Inhale 2 puffs into the lungs every 6 (six) hours as needed for wheezing or shortness of breath.   ASCORBIC ACID (VITAMIN C PO)    Take 1 tablet by mouth daily.   B COMPLEX VITAMINS CAPSULE    Take 1 capsule by mouth daily.   CALCIUM PO    Take 1 tablet by mouth daily.   DULOXETINE (CYMBALTA) 60 MG CAPSULE    TAKE 1 CAPSULE(60 MG) BY MOUTH DAILY   EPINEPHRINE 0.3 MG/0.3 ML IJ SOAJ INJECTION    Inject 0.16ml into the muscle as needed for allergic reaction   HYDROCODONE-ACETAMINOPHEN (NORCO) 7.5-325 MG TABLET    Take 1 tablet by mouth 4 (four) times daily as needed for moderate pain.   LATANOPROST (XALATAN) 0.005 % OPHTHALMIC SOLUTION    Place 1 drop into both eyes daily.   MAGNESIUM GLYCINATE PO    Take 1 tablet by mouth every other day.   MULTIPLE VITAMIN (MULTIVITAMIN) TABLET    Take 1 tablet by mouth daily.  Modified Medications   No medications on file  Discontinued Medications   No medications on file    Physical Exam:  There were no vitals filed for this visit. There is no height or weight on file to calculate BMI. Wt Readings from Last 3 Encounters:  07/23/22 197 lb (89.4 kg)  06/24/22 192 lb (87.1 kg)   06/01/22 192 lb (87.1 kg)    Physical Exam Constitutional:      Appearance: Normal appearance.  Neurological:     Mental Status: He is alert. Mental status is at baseline.  Psychiatric:        Mood and Affect: Mood normal.     Labs reviewed: Basic Metabolic Panel: Recent Labs    04/27/22 1049 06/18/22 0930 06/25/22 0329  NA 141 137 135  K 4.4 4.3 3.9  CL 107 105 104  CO2 23 23 24   GLUCOSE 95 94 129*  BUN 21 20 21   CREATININE 0.95 0.89 0.88  CALCIUM 8.8 9.6 8.4*   Liver Function Tests: Recent Labs    12/29/21 1140 04/27/22 1049  AST 18 13  ALT 12 12  BILITOT 0.5 0.3  PROT 7.0 6.7   No results for input(s): "LIPASE", "AMYLASE" in the last 8760 hours. No results for input(s): "AMMONIA" in the last 8760 hours. CBC: Recent Labs    12/29/21 1140 04/27/22 1049 05/13/22 0905 06/18/22 0930 06/25/22 0329 06/25/22 1141  WBC 5.4 4.6   < > 5.6 10.4 10.2  NEUTROABS 2,867 2,332  --   --   --   --   HGB 13.3 12.8*   < > 13.6 9.3* 10.2*  HCT 39.4 38.1*   < > 43.0 29.2* 32.0*  MCV 83.7 81.8   < > 85.7 85.9 86.5  PLT 231 245   < > 210 176 196   < > = values in this interval not displayed.   Lipid Panel: Recent Labs    12/29/21 1140 04/27/22 1049  CHOL 222* 202*  HDL 68 58  LDLCALC 137* 122*  TRIG 78 113  CHOLHDL 3.3 3.5   TSH: No results for input(s): "TSH" in the last 8760 hours. A1C: Lab Results  Component Value Date   HGBA1C 5.5 02/01/2020   DG Cervical Spine Complete  Result Date: 09/12/2022 CLINICAL DATA:  Neck pain for years. EXAM: CERVICAL SPINE - COMPLETE 4+ VIEW COMPARISON:  None Available. FINDINGS: Straightening of normal lordosis. Trace anterolisthesis of C4 on C5. Degenerative disc disease C5-C6 and C6-C7 with disc space narrowing and spurring. Mild multilevel facet hypertrophy. Posterior spurring at C5-C6 on the right and C6-C7 on the left cause mild neural foraminal stenosis. No evidence of acute or prior fracture. The lateral masses of C1  are well aligned on C2. no obvious bony destructive change or focal lesion. No prevertebral soft tissue thickening. The lung apices are clear. IMPRESSION: 1. Degenerative disc disease at C5-C6 and C6-C7. Mild multilevel facet hypertrophy. 2. Posterior spurring at C5-C6 on the right and C6-C7 on the left cause mild neural foraminal stenosis. Electronically Signed   By: Narda Rutherford M.D.   On: 09/12/2022 15:58     Assessment/Plan 1. Cervicalgia Having a hard time with daily activities due to increase in pain going from 6 tablets of hydrocodone-apap to 4 tablets daily Will have him seen by neurosurgery for evaluation and treatment if able to assist with pain control.  Additional tablet given to help with pain control- pt can take up to 5 tablets in 24 hours.  - Ambulatory referral to Neurosurgery   James [name]  [facility] [phone]    Virtual Visit via video  I connected with patient on 10/20/22 at 10:20 AM EDT by mychart and verified that I am speaking with the correct person using two identifiers.  Location: Patient: home Provider: [facility]   I discussed the limitations, risks, security and privacy concerns of performing an evaluation and management service by telephone and the availability of in person appointments. I also discussed with the patient that there may be a patient responsible charge related to this service. The patient expressed understanding and agreed to proceed.   I discussed the assessment and treatment plan with the patient. The patient was provided an opportunity to ask questions and all were answered. The patient agreed with  the plan and demonstrated an understanding of the instructions.   The patient was advised to call back or seek an in-person evaluation if the symptoms worsen or if the condition fails to improve as anticipated.  I provided 25 minutes of non-face-to-face time during this encounter.  James Harvey.  Biagio [name] Avs printed and mailed

## 2022-10-20 NOTE — Progress Notes (Signed)
  This service is provided via telemedicine  No vital signs collected/recorded due to the encounter was a telemedicine visit.   Location of patient (ex: home, work):  Home  Patient consents to a telephone visit:  Yes  Location of the provider (ex: office, home):  Office [place].   Name of any referring provider:  na  Names of all persons participating in the telemedicine service and their role in the encounter:  Hartford Poli, Patient, Nelda Severe, CMA,. Abbey Chatters, NP  Time spent on call:  7:17

## 2022-11-16 ENCOUNTER — Other Ambulatory Visit: Payer: Self-pay

## 2022-11-16 MED ORDER — HYDROCODONE-ACETAMINOPHEN 7.5-325 MG PO TABS: 1.0000 | ORAL_TABLET | ORAL | 0 refills | Status: DC | PRN

## 2022-11-16 NOTE — Telephone Encounter (Signed)
Patient called requesting refill on hydrocodone 7.5/325mg  tablet. Medication was last refilled 10/20/22. Patient does not have up to date treatment agreement on file.  Medication pended and sent to Abbey Chatters, NP

## 2022-12-14 ENCOUNTER — Telehealth: Payer: Self-pay | Admitting: *Deleted

## 2022-12-14 MED ORDER — HYDROCODONE-ACETAMINOPHEN 7.5-325 MG PO TABS: 1.0000 | ORAL_TABLET | ORAL | 0 refills | Status: DC | PRN

## 2022-12-14 NOTE — Telephone Encounter (Signed)
Patient notified and agreed.

## 2022-12-14 NOTE — Telephone Encounter (Signed)
Rx sent 

## 2022-12-14 NOTE — Telephone Encounter (Signed)
Patient called and stated that he is leaving to go [place] the [place] 2 weeks and he is leaving early in the morning.   Stated that he needs his pain medication refilled to take with him.  Epic LR: 11/16/2022 Contract Date: 10/25/20  Pended Rx and sent to [facility] for approval.

## 2023-01-20 ENCOUNTER — Other Ambulatory Visit: Payer: Self-pay

## 2023-01-20 MED ORDER — HYDROCODONE-ACETAMINOPHEN 7.5-325 MG PO TABS: ORAL_TABLET | ORAL | 0 refills | Status: DC

## 2023-01-20 NOTE — Telephone Encounter (Signed)
Patient is requesting a refill of the following medications: Requested Prescriptions   Pending Prescriptions Disp Refills   HYDROcodone-acetaminophen (NORCO) 7.5-325 MG tablet 150 tablet 0    Sig: Take 1 tablet by mouth every 4 (four) hours as needed for moderate pain (pain score 4-6) (every 4 hours as needed MAX 5 tablets in 24/hrs).    Date of last refill: 12/14/2022  Refill amount: 150  Treatment agreement date: Not on file, myhart message sent to patient to prompt the scheduling of a routine visit, in which treatment agreement will need to be signed at that time.

## 2023-01-29 ENCOUNTER — Ambulatory Visit (INDEPENDENT_AMBULATORY_CARE_PROVIDER_SITE_OTHER): Payer: 59

## 2023-01-29 DIAGNOSIS — Z23 Encounter for immunization: Secondary | ICD-10-CM | POA: Diagnosis not present

## 2023-02-22 ENCOUNTER — Other Ambulatory Visit: Payer: Self-pay | Admitting: *Deleted

## 2023-02-22 MED ORDER — HYDROCODONE-ACETAMINOPHEN 7.5-325 MG PO TABS: ORAL_TABLET | ORAL | 0 refills | Status: DC

## 2023-02-22 NOTE — Telephone Encounter (Signed)
Please call patient and set him up with routine follow up for future refills

## 2023-02-22 NOTE — Telephone Encounter (Signed)
Follow up appointment scheduled for 03/15/2023

## 2023-02-22 NOTE — Telephone Encounter (Signed)
Patient requested refill.  Epic LR: 01/20/2023 No updated contract.   Pended Rx and sent to [facility] for approval.

## 2023-03-09 ENCOUNTER — Telehealth: Payer: Self-pay

## 2023-03-09 NOTE — Telephone Encounter (Signed)
James [name] with [name] called requesting office notes from January to present. OV notes sent electronically to [phone], this documentation can be found under - chart review- misc repts- routing history.   Side note: The first routing history to [facility] was in error, as James [name] had provided an incorrect fax number.

## 2023-03-11 ENCOUNTER — Ambulatory Visit: Payer: 59 | Admitting: Neurology

## 2023-03-11 ENCOUNTER — Encounter: Payer: Self-pay | Admitting: Neurology

## 2023-03-11 VITALS — BP 130/82 | HR 82 | Ht 72.0 in | Wt 197.0 lb

## 2023-03-11 DIAGNOSIS — Z9989 Dependence on other enabling machines and devices: Secondary | ICD-10-CM

## 2023-03-11 DIAGNOSIS — G4733 Obstructive sleep apnea (adult) (pediatric): Secondary | ICD-10-CM

## 2023-03-11 NOTE — Progress Notes (Addendum)
Provider:  Melvyn Novas, MD  Primary Care Physician:  [name], NP [address] [place] [address]     Referring Provider: [name], Np [address] [place],  [place] [address]          Chief Complaint according to patient   Patient presents with:     New Patient (Initial Visit)           HISTORY OF PRESENT ILLNESS:  James [name] is a 62 y.o. male patient who is here for revisit 03/11/2023 for CPAP follow up . [name] informed me that his healthcare insurance carriers changed to [place] [employer] and he has since had trouble to find supplies covered for his CPAP.  This patient has been in extraordinarily compliant CPAP user and he is again 100% compliant for the last 30 days each of those days over 4 hours of consecutive use with the average of 9 hours 47 minutes.  He is using an AirSense 10 AutoSet between 5 and 13 cm water with 3 cm water pressure relief.  His residual AHI is 2.2/h and his 95th percentile pressure is 11.3 cm,  he has no central apneas arising, his 95th percentile air leak has increased because his mask has become so old and worn.     Chief concern according to patient :  I need urgently new supplies and has been compliant with CPAP use.    [name] is a 62 y.o. male patient who is here for revisit 07/23/2022 for  CPAP compliance , He is our patient since 2011 at [facility] .   07-23-2022: here without concerns regarding sleep. Has seasonal allergies, but not wheezing. Marland Kitchen Has been on cymbalta for ? Depression or anxiety? Pain was treated with opiates 6 months ago until surgery. He had a hip replacement 4 weeks ago, is pain free.    CPAP  compliance for [name]  has been excellent 97% by days and hours -so on average 9 and half hours at night.   He is using his new AutoSet  with a setting between 5 and 13 cm water pressure with 3 cm expiratory relief function and achieves a residual AHI of  only 1.6/h.   This is a great reduction his 95th percentile pressure is close to 11 cm water air leakage is moderate and may have something to do with facial hair and mask fit.  The 95th percentile air leak was 27 L a minute.  No central apneas arising. He has nocturia 2 times at night.      07-10-2021: RV with new CPAP, after HST confirmed apnea.  This HST confirms the presence of moderate sleep apnea that did not vary much between REM and non-REM sleep and was only significant in prone sleep or supine sleep position.   HST : The apnea-hypopnea index was 18.9/h and during REM sleep slightly lower than in non-REM sleep. Non-REM sleep AHI was 19.6 and REM sleep AHI 16.1/h.                                       Positional AHI: A supine AHI was seen at 16.4/h in prone sleep which the patient seems to prefer the AHI was 29.6/h.  On the right and left side the AHI was under 10.RECOMMENDATION: The patient should avoid prone  and supine position as both were associated with higher apnea and disease.  Since he has been a compliant CPAP user at 8 cm water and feels good with his CPAP , I will order a new machine for him - an auto titration device with CPAP function between 5 and 13 cmH2O.  2 cm EPR.   He has remained 100% compliant by days and time of use, residual AHI 1.9/h.  Airleak  95% 32.7/h. nasal interface.  95%  pressure 11 cm water.     Review of Systems: Out of a complete 14 system review, the patient complains of only the following symptoms, and all other reviewed systems are negative.:  Fatigue, sleepiness , snoring,  all mimnal on  CPAP    How likely are you to doze in the following situations: 0 = not likely, 1 = slight chance, 2 = moderate chance, 3 = high chance   Sitting and Reading? Watching Television? Sitting inactive in a public place (theater or meeting)? As a passenger in a car for an hour without a break? Lying down in the afternoon when circumstances permit? Sitting and  talking to someone? Sitting quietly after lunch without alcohol? In a car, while stopped for a few minutes in traffic?   Total = 0/ 24 points   FSS endorsed at 11/ 63 points.   GDS 2/ 15  Social History   Socioeconomic History   Marital status: Married    Spouse name: [name]   Number of children: 0   Years of education: 13   Highest education level: Not on file  Occupational History   Occupation: Marine scientist: [employer]  Tobacco Use   Smoking status: Former    Types: Cigarettes   Smokeless tobacco: Never   Tobacco comments:    Quit 25 years ago as of 2024  Vaping Use   Vaping status: Never Used  Substance and Sexual Activity   Alcohol use: Never   Drug use: No   Sexual activity: Not on file  Other Topics Concern   Not on file  Social History Narrative   Patient is married (James [name]) and lives at home with his husband.   Patient has a college education.   Patient works full-time ([employer]).   Patient is right-handed.   Patient drinks one cup of coffee daily.   Social Drivers of Corporate investment banker Strain: Not on file  Food Insecurity: No Food Insecurity (06/24/2022)   Hunger Vital Sign    Worried About Running Out of Food in the Last Year: Never true    Ran Out of Food in the Last Year: Never true  Transportation Needs: No Transportation Needs (06/24/2022)   PRAPARE - Administrator, Civil Service (Medical): No    Lack of Transportation (Non-Medical): No  Physical Activity: Not on file  Stress: Not on file  Social Connections: Unknown (07/27/2021)   Received [facility]   Social Network    Social Network: Not on file    Family History  Problem Relation Age of Onset   Heart disease Mother    Hypertension Mother    Diabetes Mother    Depression Mother    Cancer Father        2004 bladder cancer   Alcohol abuse Father     Past Medical History:  Diagnosis Date   Anxiety     Arthritis    Asthma  chldhood asthma   BPH (benign prostatic hyperplasia)    Cervicalgia    degenerative changes and foraminal stenoses   Chest pain, unspecified    Depression    Family history of colonic polyps    Father with benign colon polyp   Hepatitis B antibody positive 1984   Hiatal hernia 09/19/2010   History of kidney stones    Hyperglycemia    104 on 10/10/10   Hyperlipidemia    Obstructive sleep apnea (adult) (pediatric)    using CPAP   Other malaise and fatigue    Sinus infection    diagnosed on 05/30/22 by cpcp - tx with amoxicillin and prednisone   Supraventricular premature beats    noted on EKG 05/18/12   Wrist pain, left     Past Surgical History:  Procedure Laterality Date   COLON SURGERY  09/17/2008   poylps/hemorriods   colonoscocpy      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 8 g 2   Ascorbic Acid (VITAMIN C PO) Take 1 tablet by mouth daily.     b complex vitamins capsule Take 1 capsule by mouth daily.     CALCIUM PO Take 1 tablet by mouth daily.     DULoxetine (CYMBALTA) 60 MG capsule TAKE 1 CAPSULE(60 MG) BY MOUTH DAILY 90 capsule 3   EPINEPHrine 0.3 mg/0.3 mL IJ SOAJ injection Inject 0.61ml into the muscle as needed for allergic reaction 2 each 1   HYDROcodone-acetaminophen (NORCO) 7.5-325 MG tablet Every 4 hours as needed pain (MAX 5 in 24 hours) 150 tablet 0   latanoprost (XALATAN) 0.005 % ophthalmic solution Place 1 drop into both eyes daily.     MAGNESIUM GLYCINATE PO Take 1 tablet by mouth every other day.     Multiple Vitamin  (MULTIVITAMIN) tablet Take 1 tablet by mouth daily.     No current facility-administered medications on file prior to visit.    Allergies  Allergen Reactions   Bee Venom Anaphylaxis   Codeine Nausea And Vomiting   Skelaxin [Metaxalone] Other (See Comments)    Lethargic    Yellow Jacket Venom Anaphylaxis   Augmentin [Amoxicillin-Pot Clavulanate] Nausea And Vomiting   Sedapap [Butalbital-Acetaminophen] Other (See Comments)    Unknown reaction    Wellbutrin [Bupropion]     Altered mental state     DIAGNOSTIC DATA (LABS, IMAGING, TESTING) - I reviewed patient records, labs, notes, testing and imaging myself where available.  Lab Results  Component Value Date   WBC 10.2 06/25/2022   HGB 10.2 (L) 06/25/2022   HCT 32.0 (L) 06/25/2022   MCV 86.5 06/25/2022   PLT 196 06/25/2022      Component Value Date/Time   NA 135 06/25/2022 0329   NA 140 08/07/2015 1009   K 3.9 06/25/2022 0329   CL 104 06/25/2022 0329   CO2 24 06/25/2022 0329   GLUCOSE 129 (H) 06/25/2022 0329   BUN 21 06/25/2022 0329   BUN 16 08/07/2015 1009   CREATININE 0.88 06/25/2022 0329   CREATININE 0.95 04/27/2022 1049   CALCIUM 8.4 (L) 06/25/2022  0329   PROT 6.7 04/27/2022 1049   PROT 6.3 08/07/2015 1009   ALBUMIN 4.0 11/02/2016 1512   ALBUMIN 4.1 08/07/2015 1009   AST 13 04/27/2022 1049   ALT 12 04/27/2022 1049   ALKPHOS 64 11/02/2016 1512   BILITOT 0.3 04/27/2022 1049   BILITOT 0.3 08/07/2015 1009   GFRNONAA >60 06/25/2022 0329   GFRNONAA 80 02/01/2020 1028   GFRAA 93 02/01/2020 1028   Lab Results  Component Value Date   CHOL 202 (H) 04/27/2022   HDL 58 04/27/2022   LDLCALC 122 (H) 04/27/2022   TRIG 113 04/27/2022   CHOLHDL 3.5 04/27/2022   Lab Results  Component Value Date   HGBA1C 5.5 02/01/2020   No results found for: "VITAMINB12" Lab Results  Component Value Date   TSH 0.90 11/02/2016    PHYSICAL EXAM:  Today's Vitals   03/11/23 1319  BP: 130/82  Pulse: 82  Weight: 197 lb (89.4  kg)  Height: 6' (1.829 m)   Body mass index is 26.72 kg/m.   Wt Readings from Last 3 Encounters:  03/11/23 197 lb (89.4 kg)  07/23/22 197 lb (89.4 kg)  06/24/22 192 lb (87.1 kg)     Ht Readings from Last 3 Encounters:  03/11/23 6' (1.829 m)  07/23/22 6' (1.829 m)  06/24/22 6' (1.829 m)      General:  The patient is awake, alert and appears not in acute distress.  No interval medical problem.  The patient is well groomed. Head: Normocephalic, atraumatic. Neck is supple. Mallampati 2   neck circumference: 15 inches . Nasal airflow restricted, mustache, TMJ is evident. Retrognathia is not seen.  Cardiovascular: Regular rate and rhythm , without murmurs or carotid bruit, and without distended neck veins. Respiratory: Lungs are clear to auscultation. Skin:  Without evidence of edema or rash. Trunk: BMI is elevated and patient has normal posture.   Neurologic exam : The patient is awake and alert, oriented to place and time.   Memory subjective described as intact.    Cranial nerves:no loss of smell and tatste.  Pupils are equal and briskly reactive to light.  Extraocular movements  in vertical and horizontal planes intact and without nystagmus.  Facial motor strength is symmetric wiht tongue and uvula moving in midline. Motor exam: Normal tone  muscle bulk and symmetric, strength in all extremities. Sensory: Fine touch, pinprick and vibration were tested in all extremities.    ASSESSMENT AND PLAN 62 y.o. year old male  here with:    1) OSA - very well controlled on CPAP - 100% compliance - had a HST in early 2023.   Needs supplies. Has documented ongoing clinical benefit by using CPAP complaintly, no EDS, no fatigue and no nocturia.    I plan to follow up either personally or through our NP within 12 months.   I would like to thank [name], Np [address] [place],  [place] [address] for allowing me to meet with and to take care of this pleasant patient.     After spending a total time of  25  minutes face to face and additional time for physical and neurologic examination, review of laboratory studies,  personal review of imaging studies, reports and results of other testing and review of referral information / records as far as provided in visit,   Electronically signed by: Melvyn Novas, MD 03/11/2023 1:50 PM  [facility] Board certified by [employer] of Sleep  Medicine and Diplomate of the American Academy of Sleep Medicine. Board certified In Neurology through the ABPN, Fellow of the [employer] of Neurology.

## 2023-03-15 ENCOUNTER — Ambulatory Visit (INDEPENDENT_AMBULATORY_CARE_PROVIDER_SITE_OTHER): Payer: 59 | Admitting: Nurse Practitioner

## 2023-03-15 ENCOUNTER — Encounter: Payer: Self-pay | Admitting: Nurse Practitioner

## 2023-03-15 ENCOUNTER — Encounter: Payer: Self-pay | Admitting: Neurology

## 2023-03-15 VITALS — BP 118/76 | HR 87 | Temp 97.1°F | Ht 72.0 in | Wt 203.0 lb

## 2023-03-15 DIAGNOSIS — F1120 Opioid dependence, uncomplicated: Secondary | ICD-10-CM

## 2023-03-15 DIAGNOSIS — G4733 Obstructive sleep apnea (adult) (pediatric): Secondary | ICD-10-CM

## 2023-03-15 DIAGNOSIS — M25571 Pain in right ankle and joints of right foot: Secondary | ICD-10-CM

## 2023-03-15 DIAGNOSIS — E785 Hyperlipidemia, unspecified: Secondary | ICD-10-CM

## 2023-03-15 DIAGNOSIS — N401 Enlarged prostate with lower urinary tract symptoms: Secondary | ICD-10-CM

## 2023-03-15 DIAGNOSIS — Z79899 Other long term (current) drug therapy: Secondary | ICD-10-CM

## 2023-03-15 DIAGNOSIS — F411 Generalized anxiety disorder: Secondary | ICD-10-CM

## 2023-03-15 DIAGNOSIS — M542 Cervicalgia: Secondary | ICD-10-CM | POA: Diagnosis not present

## 2023-03-15 DIAGNOSIS — D649 Anemia, unspecified: Secondary | ICD-10-CM

## 2023-03-15 DIAGNOSIS — R3911 Hesitancy of micturition: Secondary | ICD-10-CM

## 2023-03-15 NOTE — Progress Notes (Signed)
Careteam: Patient Care Team: [name], NP as PCP - General (Geriatric Medicine) Ollen Gross, MD as Consulting Physician ([facility] Thalia Party, MD as Consulting Physician (Gastroenterology)  PLACE OF SERVICE:  [facility]  Advanced Directive information Does Patient Have a Medical Advance Directive?: Yes, Type of Advance Directive: Healthcare Power of [place];Living will, Does patient want to make changes to medical advance directive?: No - Patient declined  Allergies  Allergen Reactions   Bee Venom Anaphylaxis   Codeine Nausea And Vomiting   Skelaxin [Metaxalone] Other (See Comments)    Lethargic    Yellow Jacket Venom Anaphylaxis   Augmentin [Amoxicillin-Pot Clavulanate] Nausea And Vomiting   Sedapap [Butalbital-Acetaminophen] Other (See Comments)    Unknown reaction    Wellbutrin [Bupropion]     Altered mental state    Chief Complaint  Patient presents with   Medical Management of Chronic Issues    10 month follow-up. Patient c/o right wrist and ankle concerns. Constant neck pain.      HPI: Patient is a 62 y.o. male for routine follow up.   OSA- seeing neurology to follow up CPAP- working with supplier to get supplies, he has been very compliant and using every night.   Followed by ophthalmologist due to glaucoma   Reports he was doing his usual activity but then started having severe wrist pain, 10/10, went to ED xray was normal After a few days started feeling better- this was 2 week ago. Then ankle started hurting.  He has iced both wrist and ankle.  Ankle has been bothering him for about a week.   Reports he is taking hydrocodone- apap throughout the day- he was unable to go down to 4 tablets a day, taking hydrocodone- 5 tablets daily keeps the pain at 3-4/10 daily  Has not fasted, eaten blueberries and banana   Review of Systems:  Review of Systems  Constitutional:  Negative for chills, fever and weight loss.   HENT:  Negative for tinnitus.   Respiratory:  Negative for cough, sputum production and shortness of breath.   Cardiovascular:  Negative for chest pain, palpitations and leg swelling.  Gastrointestinal:  Negative for abdominal pain, constipation, diarrhea and heartburn.  Genitourinary:  Negative for dysuria, frequency and urgency.  Musculoskeletal:  Positive for joint pain, myalgias and neck pain. Negative for back pain and falls.  Skin: Negative.   Neurological:  Negative for dizziness and headaches.  Psychiatric/Behavioral:  Negative for depression and memory loss. The patient does not have insomnia.     Past Medical History:  Diagnosis Date   Anxiety    Arthritis    Asthma    chldhood asthma   BPH (benign prostatic hyperplasia)    Cervicalgia    degenerative changes and foraminal stenoses   Chest pain, unspecified    Depression    Family history of colonic polyps    Father with benign colon polyp   Hepatitis B antibody positive 1984   Hiatal hernia 09/19/2010   History of kidney stones    Hyperglycemia    104 on 10/10/10   Hyperlipidemia    Obstructive sleep apnea (adult) (pediatric)    using CPAP   Other malaise and fatigue    Sinus infection    diagnosed on 05/30/22 by cpcp - tx with amoxicillin and prednisone   Supraventricular premature beats    noted on EKG 05/18/12   Wrist pain, left    Past Surgical History:  Procedure Laterality Date  COLON SURGERY  09/17/2008   poylps/hemorriods   colonoscocpy      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 His smoking use included cigarettes. He has never used smokeless tobacco. He reports that he does not drink alcohol and does not use drugs.  Family History  Problem Relation Age of Onset    Heart disease Mother    Hypertension Mother    Diabetes Mother    Depression Mother    Cancer Father        2004 bladder cancer   Alcohol abuse Father    Heart Problems Sister        Visual merchandiser insert    Medications: Patient's Medications  New Prescriptions   No medications on file  Previous Medications   ALBUTEROL (VENTOLIN HFA) 108 (90 BASE) MCG/ACT INHALER    Inhale 2 puffs into the lungs every 6 (six) hours as needed for wheezing or shortness of breath.   ASCORBIC ACID (VITAMIN C PO)    Take 1 tablet by mouth daily.   B COMPLEX VITAMINS CAPSULE    Take 1 capsule by mouth every other day.   CALCIUM PO    Take 1 tablet by mouth daily.   DULOXETINE (CYMBALTA) 60 MG CAPSULE    TAKE 1 CAPSULE(60 MG) BY MOUTH DAILY   EPINEPHRINE 0.3 MG/0.3 ML IJ SOAJ INJECTION    Inject 0.30ml into the muscle as needed for allergic reaction   HYDROCODONE-ACETAMINOPHEN (NORCO) 7.5-325 MG TABLET    Every 4 hours as needed pain (MAX 5 in 24 hours)   LATANOPROST (XALATAN) 0.005 % OPHTHALMIC SOLUTION    Place 1 drop into both eyes daily.   MAGNESIUM GLYCINATE PO    Take 1 tablet by mouth every other day.   MULTIPLE VITAMIN (MULTIVITAMIN) TABLET    Take 1 tablet by mouth daily.  Modified Medications   No medications on file  Discontinued Medications   No medications on file    Physical Exam:  Vitals:   03/15/23 0841  BP: 118/76  Pulse: 87  Temp: (!) 97.1 F (36.2 C)  TempSrc: Temporal  SpO2: 96%  Weight: 203 lb (92.1 kg)  Height: 6' (1.829 m)   Body mass index is 27.53 kg/m. Wt Readings from Last 3 Encounters:  03/15/23 203 lb (92.1 kg)  03/11/23 197 lb (89.4 kg)  07/23/22 197 lb (89.4 kg)    Physical Exam Constitutional:      General: He is not in acute distress.    Appearance: He is well-developed. He is not diaphoretic.  HENT:     Head: Normocephalic and atraumatic.     Right Ear: External ear normal.     Left Ear: External ear normal.     Mouth/Throat:     Pharynx: No  oropharyngeal exudate.  Eyes:     Conjunctiva/sclera: Conjunctivae normal.     Pupils: Pupils are equal, round, and reactive to light.  Cardiovascular:     Rate and Rhythm: Normal rate and regular rhythm.     Heart sounds: Normal heart sounds.  Pulmonary:     Effort: Pulmonary effort is normal.     Breath sounds: Normal breath sounds.  Abdominal:     General: Bowel sounds are normal.     Palpations: Abdomen  is soft.  Musculoskeletal:        General: No tenderness.     Cervical back: Normal range of motion and neck supple.     Right lower leg: No edema.     Left lower leg: No edema.  Skin:    General: Skin is warm and dry.  Neurological:     Mental Status: He is alert and oriented to person, place, and time.     Motor: No weakness.     Gait: Gait normal.     Labs reviewed: Basic Metabolic Panel: Recent Labs    04/27/22 1049 06/18/22 0930 06/25/22 0329  NA 141 137 135  K 4.4 4.3 3.9  CL 107 105 104  CO2 23 23 24   GLUCOSE 95 94 129*  BUN 21 20 21   CREATININE 0.95 0.89 0.88  CALCIUM 8.8 9.6 8.4*   Liver Function Tests: Recent Labs    04/27/22 1049  AST 13  ALT 12  BILITOT 0.3  PROT 6.7   No results for input(s): "LIPASE", "AMYLASE" in the last 8760 hours. No results for input(s): "AMMONIA" in the last 8760 hours. CBC: Recent Labs    04/27/22 1049 05/13/22 0905 06/18/22 0930 06/25/22 0329 06/25/22 1141  WBC 4.6   < > 5.6 10.4 10.2  NEUTROABS 2,332  --   --   --   --   HGB 12.8*   < > 13.6 9.3* 10.2*  HCT 38.1*   < > 43.0 29.2* 32.0*  MCV 81.8   < > 85.7 85.9 86.5  PLT 245   < > 210 176 196   < > = values in this interval not displayed.   Lipid Panel: Recent Labs    04/27/22 1049  CHOL 202*  HDL 58  LDLCALC 122*  TRIG 113  CHOLHDL 3.5   TSH: No results for input(s): "TSH" in the last 8760 hours. A1C: Lab Results  Component Value Date   HGBA1C 5.5 02/01/2020     Assessment/Plan 1. Cervicalgia (Primary) -ongoing, continues use of  hydrocodone-apap 5 tablets daily - Urine Drug Screen w/Alc, no confirm([facility])  2. Uncomplicated opioid dependence (HCC) -ongoing, use of hydrocodone-apap was not able to wean to 4 tablets a day due to withdrawals and increase in pain  3. Hyperlipidemia, unspecified hyperlipidemia type Continue dietary modifications - Lipid panel - COMPLETE METABOLIC PANEL WITH GFR  4. Benign prostatic hyperplasia with urinary hesitancy -stable without changes in symptoms  5. Anxiety state Well controlled on cymbalta  6. Obstructive sleep apnea (adult) (pediatric) -continues on CPAP, uses nightly Working to gets supplies.  7. Low hemoglobin Noted on last labs, will follow up  - CBC with Differential/Platelet  8. High risk medication use - Urine Drug Screen w/Alc, no confirm([facility])  9. Acute right ankle pain To use naproxen 1 tablet BID for 5 days with food -ice tid - Uric Acid   Return in about 6 months (around 09/13/2023) for routine follow up.  Janene Harvey. Biagio Borg [facility] [phone]

## 2023-03-15 NOTE — Patient Instructions (Signed)
Aleve 1 tablet twice daily for 5-7 days with food.

## 2023-03-15 NOTE — Addendum Note (Signed)
Addended by: Maurice Small on: 03/15/2023 11:45 AM   Modules accepted: Orders

## 2023-03-16 LAB — COMPLETE METABOLIC PANEL WITH GFR
AG Ratio: 1.5 (calc) (ref 1.0–2.5)
ALT: 13 U/L (ref 9–46)
AST: 16 U/L (ref 10–35)
Albumin: 4.1 g/dL (ref 3.6–5.1)
Alkaline phosphatase (APISO): 56 U/L (ref 35–144)
BUN: 16 mg/dL (ref 7–25)
CO2: 26 mmol/L (ref 20–32)
Calcium: 9.6 mg/dL (ref 8.6–10.3)
Chloride: 106 mmol/L (ref 98–110)
Creat: 0.9 mg/dL (ref 0.70–1.35)
Globulin: 2.8 g/dL (ref 1.9–3.7)
Glucose, Bld: 96 mg/dL (ref 65–139)
Potassium: 4.5 mmol/L (ref 3.5–5.3)
Sodium: 139 mmol/L (ref 135–146)
Total Bilirubin: 0.4 mg/dL (ref 0.2–1.2)
Total Protein: 6.9 g/dL (ref 6.1–8.1)
eGFR: 95 mL/min/{1.73_m2} (ref >=60)

## 2023-03-16 LAB — LIPID PANEL
Cholesterol: 261 mg/dL — ABNORMAL HIGH (ref <200)
HDL: 71 mg/dL (ref >=40)
LDL Cholesterol (Calc): 159 mg/dL — ABNORMAL HIGH
Non-HDL Cholesterol (Calc): 190 mg/dL — ABNORMAL HIGH (ref <130)
Total CHOL/HDL Ratio: 3.7 (calc) (ref <5.0)
Triglycerides: 164 mg/dL — ABNORMAL HIGH (ref <150)

## 2023-03-16 LAB — CBC WITH DIFFERENTIAL/PLATELET
Absolute Lymphocytes: 1874 {cells}/uL (ref 850–3900)
Absolute Monocytes: 437 {cells}/uL (ref 200–950)
Basophils Absolute: 38 {cells}/uL (ref 0–200)
Basophils Relative: 0.7 %
Eosinophils Absolute: 297 {cells}/uL (ref 15–500)
Eosinophils Relative: 5.5 %
HCT: 42.5 % (ref 38.5–50.0)
Hemoglobin: 14.2 g/dL (ref 13.2–17.1)
MCH: 28.1 pg (ref 27.0–33.0)
MCHC: 33.4 g/dL (ref 32.0–36.0)
MCV: 84.2 fL (ref 80.0–100.0)
MPV: 11.5 fL (ref 7.5–12.5)
Monocytes Relative: 8.1 %
Neutro Abs: 2754 {cells}/uL (ref 1500–7800)
Neutrophils Relative %: 51 %
Platelets: 219 10*3/uL (ref 140–400)
RBC: 5.05 10*6/uL (ref 4.20–5.80)
RDW: 12.8 % (ref 11.0–15.0)
Total Lymphocyte: 34.7 %
WBC: 5.4 10*3/uL (ref 3.8–10.8)

## 2023-03-16 LAB — URIC ACID: Uric Acid, Serum: 5 mg/dL (ref 4.0–8.0)

## 2023-03-18 LAB — DRUG MONITORING, PANEL 6 WITH CONFIRMATION, URINE
6 Acetylmorphine: NEGATIVE ng/mL (ref <10)
Alcohol Metabolites: NEGATIVE ng/mL (ref <500)
Amphetamines: NEGATIVE ng/mL (ref <500)
Barbiturates: NEGATIVE ng/mL (ref <300)
Benzodiazepines: NEGATIVE ng/mL (ref <100)
Cocaine Metabolite: NEGATIVE ng/mL (ref <150)
Codeine: NEGATIVE ng/mL (ref <50)
Creatinine: 88.4 mg/dL (ref >=20.0)
Hydrocodone: 875 ng/mL — ABNORMAL HIGH (ref <50)
Hydromorphone: 125 ng/mL — ABNORMAL HIGH (ref <50)
Marijuana Metabolite: NEGATIVE ng/mL (ref <20)
Methadone Metabolite: NEGATIVE ng/mL (ref <100)
Morphine: NEGATIVE ng/mL (ref <50)
Norhydrocodone: 927 ng/mL — ABNORMAL HIGH (ref <50)
Opiates: POSITIVE ng/mL — AB (ref <100)
Oxidant: NEGATIVE ug/mL (ref <200)
Oxycodone: NEGATIVE ng/mL (ref <100)
Phencyclidine: NEGATIVE ng/mL (ref <25)
pH: 5.4 (ref 4.5–9.0)

## 2023-03-18 LAB — DM TEMPLATE

## 2023-03-25 ENCOUNTER — Telehealth: Payer: Self-pay

## 2023-03-25 ENCOUNTER — Ambulatory Visit: Payer: Commercial Managed Care - PPO | Admitting: Adult Health

## 2023-03-25 ENCOUNTER — Ambulatory Visit: Payer: 59 | Admitting: Nurse Practitioner

## 2023-03-25 ENCOUNTER — Encounter: Payer: Self-pay | Admitting: Adult Health

## 2023-03-25 VITALS — BP 121/78 | HR 82 | Temp 98.0°F | Resp 20 | Ht 72.0 in | Wt 207.4 lb

## 2023-03-25 DIAGNOSIS — J029 Acute pharyngitis, unspecified: Secondary | ICD-10-CM | POA: Diagnosis not present

## 2023-03-25 DIAGNOSIS — F329 Major depressive disorder, single episode, unspecified: Secondary | ICD-10-CM | POA: Diagnosis not present

## 2023-03-25 DIAGNOSIS — G4733 Obstructive sleep apnea (adult) (pediatric): Secondary | ICD-10-CM | POA: Diagnosis not present

## 2023-03-25 DIAGNOSIS — M542 Cervicalgia: Secondary | ICD-10-CM | POA: Diagnosis not present

## 2023-03-25 LAB — POCT RAPID STREP A (OFFICE): Rapid Strep A Screen: NEGATIVE

## 2023-03-25 MED ORDER — HYDROCODONE-ACETAMINOPHEN 7.5-325 MG PO TABS
ORAL_TABLET | ORAL | 0 refills | Status: DC
Start: 2023-03-25 — End: 2023-04-26

## 2023-03-25 NOTE — Progress Notes (Signed)
 [facility] clinic  Provider:  Jereld Serum DNP  Code Status:  Full Code  Goals of Care:     03/15/2023    8:42 AM  Advanced Directives  Does Patient Have a Medical Advance Directive? Yes  Type of Estate Agent of [place];Living will  Does patient want to make changes to medical advance directive? No - Patient declined  Copy of Healthcare Power of Attorney in Chart? Yes - validated most recent copy scanned in chart (See row information)     Chief Complaint  Patient presents with   Acute Visit    sore throat and medication refills. Patient used salt and water  to gurgle before visit.     HPI: Patient is a 63 y.o. male seen today for an acute visit for an acute visit regarding sore throat X 1 week. He stated that it is painful when he swallows. He has bee gargling with salt and warm water . He started having dry cough 6 days ago. No fever nor chills.  Noted to have erythematous throat.  Takes Cymbalta  for depression, not depressed  Neck pain 5-6/10, takes Norco PRN  Past Medical History:  Diagnosis Date   Anxiety    Arthritis    Asthma    chldhood asthma   BPH (benign prostatic hyperplasia)    Cervicalgia    degenerative changes and foraminal stenoses   Chest pain, unspecified    Depression    Family history of colonic polyps    Father with benign colon polyp   Hepatitis B antibody positive 1984   Hiatal hernia 09/19/2010   History of kidney stones    Hyperglycemia    104 on 10/10/10   Hyperlipidemia    Obstructive sleep apnea (adult) (pediatric)    using CPAP   Other malaise and fatigue    Sinus infection    diagnosed on 05/30/22 by [facility] - tx with amoxicillin and prednisone    Supraventricular premature beats    noted on EKG 05/18/12   Wrist pain, left     Past Surgical History:  Procedure Laterality Date   COLON SURGERY  09/17/2008   poylps/hemorriods   colonoscocpy      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   Procedure: TOTAL HIP  ARTHROPLASTY ANTERIOR APPROACH;  Surgeon: Melodi Lerner, MD;  Location: [facility];  Service: Orthopedics;  Laterality: Left;   WISDOM TOOTH EXTRACTION  03/23/2009    Allergies  Allergen Reactions   Bee Venom Anaphylaxis   Codeine Nausea And Vomiting   Skelaxin [Metaxalone] Other (See Comments)    Lethargic    Yellow Jacket Venom Anaphylaxis   Augmentin [Amoxicillin-Pot Clavulanate] Nausea And Vomiting   Sedapap [Butalbital-Acetaminophen ] Other (See Comments)    Unknown reaction    Wellbutrin [Bupropion]     Altered mental state    Outpatient Encounter Medications as of 03/25/2023  Medication Sig   albuterol  (VENTOLIN  HFA) 108 (90 Base) MCG/ACT inhaler Inhale 2 puffs into the lungs every 6 (six) hours as needed for wheezing or shortness of breath.   Ascorbic Acid (VITAMIN C PO) Take 1 tablet by mouth daily.   azithromycin (ZITHROMAX) 250 MG tablet Take 250 mg by mouth daily.   b complex vitamins capsule Take 1 capsule by mouth every other day.   CALCIUM  PO Take 1 tablet by mouth daily.   DULoxetine  (CYMBALTA ) 60 MG capsule TAKE 1 CAPSULE(60 MG) BY MOUTH DAILY   EPINEPHrine  0.3 mg/0.3 mL IJ SOAJ injection Inject 0.71ml into the muscle as needed for  allergic reaction   HYDROcodone -acetaminophen  (NORCO) 7.5-325 MG tablet Every 4 hours as needed pain (MAX 5 in 24 hours)   latanoprost  (XALATAN ) 0.005 % ophthalmic solution Place 1 drop into both eyes daily.   MAGNESIUM GLYCINATE PO Take 1 tablet by mouth every other day.   Multiple Vitamin (MULTIVITAMIN) tablet Take 1 tablet by mouth daily.   No facility-administered encounter medications on file as of 03/25/2023.    Review of Systems:  Review of Systems  Constitutional:  Negative for activity change, appetite change and fever.  HENT:  Positive for sore throat.   Eyes: Negative.   Respiratory:  Positive for cough.   Cardiovascular:  Negative for chest pain and leg swelling.  Gastrointestinal:  Negative for abdominal distention, diarrhea  and vomiting.  Genitourinary:  Negative for dysuria, frequency and urgency.  Skin:  Negative for color change.  Neurological:  Negative for dizziness and headaches.  Psychiatric/Behavioral:  Negative for behavioral problems and sleep disturbance. The patient is not nervous/anxious.     Health Maintenance  Topic Date Due   Colonoscopy  01/30/2021   COVID-19 Vaccine (9 - 2024-25 season) 03/19/2023   DTaP/Tdap/Td (4 - Td or Tdap) 12/17/2030   INFLUENZA VACCINE  Completed   Hepatitis C Screening  Completed   HIV Screening  Completed   Zoster Vaccines- Shingrix  Completed   HPV VACCINES  Aged Out    Physical Exam: Vitals:   03/25/23 1012  BP: 121/78  Pulse: 82  Resp: 20  Temp: 98 F (36.7 C)  SpO2: 98%  Weight: 207 lb 6.4 oz (94.1 kg)  Height: 6' (1.829 m)   Body mass index is 28.13 kg/m. Physical Exam Constitutional:      Appearance: Normal appearance.  HENT:     Head: Normocephalic and atraumatic.     Mouth/Throat:     Mouth: Mucous membranes are moist.     Pharynx: Posterior oropharyngeal erythema present.  Eyes:     Conjunctiva/sclera: Conjunctivae normal.  Cardiovascular:     Rate and Rhythm: Normal rate and regular rhythm.     Pulses: Normal pulses.     Heart sounds: Normal heart sounds.  Pulmonary:     Effort: Pulmonary effort is normal.     Breath sounds: Normal breath sounds.  Abdominal:     General: Bowel sounds are normal.     Palpations: Abdomen is soft.  Musculoskeletal:        General: No swelling. Normal range of motion.     Cervical back: Normal range of motion.  Lymphadenopathy:     Cervical: No cervical adenopathy.  Skin:    General: Skin is warm and dry.  Neurological:     General: No focal deficit present.     Mental Status: He is alert and oriented to person, place, and time.  Psychiatric:        Mood and Affect: Mood normal.        Behavior: Behavior normal.        Thought Content: Thought content normal.        Judgment: Judgment  normal.     Labs reviewed: Basic Metabolic Panel: Recent Labs    06/18/22 0930 06/25/22 0329 03/15/23 1100  NA 137 135 139  K 4.3 3.9 4.5  CL 105 104 106  CO2 23 24 26   GLUCOSE 94 129* 96  BUN 20 21 16   CREATININE 0.89 0.88 0.90  CALCIUM  9.6 8.4* 9.6   Liver Function Tests: Recent Labs    04/27/22  1049 03/15/23 1100  AST 13 16  ALT 12 13  BILITOT 0.3 0.4  PROT 6.7 6.9   No results for input(s): LIPASE, AMYLASE in the last 8760 hours. No results for input(s): AMMONIA in the last 8760 hours. CBC: Recent Labs    04/27/22 1049 05/13/22 0905 06/25/22 0329 06/25/22 1141 03/15/23 1100  WBC 4.6   < > 10.4 10.2 5.4  NEUTROABS 2,332  --   --   --  2,754  HGB 12.8*   < > 9.3* 10.2* 14.2  HCT 38.1*   < > 29.2* 32.0* 42.5  MCV 81.8   < > 85.9 86.5 84.2  PLT 245   < > 176 196 219   < > = values in this interval not displayed.   Lipid Panel: Recent Labs    04/27/22 1049 03/15/23 1100  CHOL 202* 261*  HDL 58 71  LDLCALC 122* 159*  TRIG 113 164*  CHOLHDL 3.5 3.7   Lab Results  Component Value Date   HGBA1C 5.5 02/01/2020    Procedures since last visit: No results found.  Assessment/Plan  1. Sore throat (Primary) -  has sore throat and cough X 1 week -  will start on Azithromycin - azithromycin (ZITHROMAX) 250 MG tablet; Take 250 mg by mouth daily. - POCT rapid strep A negative  2. Major depression, chronic -  mood is stable -  continue Cymbalta   3. Cervicalgia -  continue Cymbalta  and Norco PRN - HYDROcodone -acetaminophen  (NORCO) 7.5-325 MG tablet; Every 4 hours as needed pain (MAX 5 in 24 hours)  Dispense: 150 tablet; Refill: 0  4. OSA (obstructive sleep apnea) -  continue CPAP at bedtime    Labs/tests ordered:   POCT rapid strep A   Next appt:  09/20/2023

## 2023-03-25 NOTE — Telephone Encounter (Signed)
 Patient was calling for medication refill and severe sore throat. Patient has been added to the schedule.

## 2023-03-31 ENCOUNTER — Encounter: Payer: Self-pay | Admitting: Nurse Practitioner

## 2023-04-02 ENCOUNTER — Telehealth: Payer: Self-pay

## 2023-04-02 NOTE — Telephone Encounter (Signed)
 Patient states he is still interested in the appointment with [facility], patient just want to let you know he is being proactive but not getting a returned phone call.

## 2023-04-26 ENCOUNTER — Other Ambulatory Visit: Payer: Self-pay | Admitting: *Deleted

## 2023-04-26 DIAGNOSIS — M542 Cervicalgia: Secondary | ICD-10-CM

## 2023-04-26 MED ORDER — HYDROCODONE-ACETAMINOPHEN 7.5-325 MG PO TABS
ORAL_TABLET | ORAL | 0 refills | Status: DC
Start: 2023-04-26 — End: 2023-05-24

## 2023-04-26 NOTE — Telephone Encounter (Signed)
Patient requested refill.  Epic LR: 03/25/2023 Contract Date: 10/25/2020 added note to upcoming appointment to update.   Pended Rx and sent to [facility] for approval.

## 2023-05-11 ENCOUNTER — Telehealth: Payer: Self-pay

## 2023-05-11 NOTE — Telephone Encounter (Signed)
 Message left on clinical intake voicemail:   Patient called to say he was seen at [facility] and they prescribed methylprednisolone and he questions what [name], NP thoughts are about him taking that with a diagnosis of glaucoma

## 2023-05-12 NOTE — Telephone Encounter (Signed)
Patient is aware of response and verbalized understanding

## 2023-05-12 NOTE — Telephone Encounter (Signed)
 Patient is calling again to check the status of his message. Please advise

## 2023-05-12 NOTE — Telephone Encounter (Signed)
 If his glaucoma is controlled he should be okay with this, it can increase the pressure but if he has been followed by his ophthalmologist and taking drops as prescribed should be okay for short course.

## 2023-05-19 ENCOUNTER — Encounter: Payer: Self-pay | Admitting: Adult Health

## 2023-05-19 ENCOUNTER — Telehealth: Payer: Commercial Managed Care - PPO | Admitting: Adult Health

## 2023-05-19 ENCOUNTER — Telehealth: Payer: Self-pay

## 2023-05-19 DIAGNOSIS — M542 Cervicalgia: Secondary | ICD-10-CM

## 2023-05-19 DIAGNOSIS — J069 Acute upper respiratory infection, unspecified: Secondary | ICD-10-CM | POA: Diagnosis not present

## 2023-05-19 DIAGNOSIS — J4520 Mild intermittent asthma, uncomplicated: Secondary | ICD-10-CM | POA: Diagnosis not present

## 2023-05-19 MED ORDER — BENZONATATE 100 MG PO CAPS
100.0000 mg | ORAL_CAPSULE | Freq: Three times a day (TID) | ORAL | 0 refills | Status: DC | PRN
Start: 2023-05-19 — End: 2023-09-20

## 2023-05-19 MED ORDER — ALBUTEROL SULFATE HFA 108 (90 BASE) MCG/ACT IN AERS
2.0000 | INHALATION_SPRAY | Freq: Four times a day (QID) | RESPIRATORY_TRACT | 2 refills | Status: DC | PRN
Start: 2023-05-19 — End: 2023-06-30

## 2023-05-19 NOTE — Telephone Encounter (Signed)
-    continue gargle with warm salt water for sore throat relief.  -  drink at least 8 cups of water/day.

## 2023-05-19 NOTE — Progress Notes (Signed)
 Virtual Visit via Video Note  I connected with James [name] on 05/19/23 at  1:00 PM EST by a video enabled telemedicine application and verified that I am speaking with the correct person using two identifiers.  Patient Location: Home Provider Location: Office/Clinic  I discussed the limitations, risks, security, and privacy concerns of performing an evaluation and management service by video and the availability of in person appointments. I also discussed with the patient that there may be a patient responsible charge related to this service. The patient expressed understanding and agreed to proceed.   PCP: [name], NP  Chief Complaint  Patient presents with   Acute Visit    Fever, sore throat, body aches   HPI  Discussed the use of AI scribe software for clinical note transcription with the patient, who gave verbal consent to proceed.   James [name] is a 63 year old male who presents with fever, sore throat, and cough.  He woke up this morning with a fever of 100F, feeling achy, and experiencing a severe sore throat accompanied by a cough. Initially, the cough was productive with yellow phlegm, but it has since become non-productive. The cough is painful, and he feels weak and lethargic. No shortness of breath is reported.  He has a history of childhood asthma, which he has not experienced since he was 63 years old. He reports wheezing but no shortness of breath and has been using albuterol PRN to manage the wheezing.  He is taking vitamin C, lime juice, and apple cider vinegar to alleviate his symptoms, consuming about three tablespoons of apple cider vinegar today, which he gargles and swallows. He has not taken Mucinex, although it is available. He takes Cymbalta 60 mg daily for depression and Norco 7.5/325 mg every four hours as needed for chronic neck pain, which he rates as a 4 out of 10 with medication.  His partner started experiencing similar  symptoms on Saturday but is feeling much better today. His partner did not seek medical attention or take Tamiflu.      ROS: Per HPI    Current Outpatient Medications:    albuterol (VENTOLIN HFA) 108 (90 Base) MCG/ACT inhaler, Inhale 2 puffs into the lungs every 6 (six) hours as needed for wheezing or shortness of breath., Disp: 8 g, Rfl: 2   Ascorbic Acid (VITAMIN C PO), Take 1 tablet by mouth daily., Disp: , Rfl:    b complex vitamins capsule, Take 1 capsule by mouth every other day., Disp: , Rfl:    CALCIUM PO, Take 1 tablet by mouth daily., Disp: , Rfl:    DULoxetine (CYMBALTA) 60 MG capsule, TAKE 1 CAPSULE(60 MG) BY MOUTH DAILY, Disp: 90 capsule, Rfl: 3   EPINEPHrine 0.3 mg/0.3 mL IJ SOAJ injection, Inject 0.26ml into the muscle as needed for allergic reaction, Disp: 2 each, Rfl: 1   HYDROcodone-acetaminophen (NORCO) 7.5-325 MG tablet, Take one tablets by mouth Every 4 hours as needed pain (MAX 5 in 24 hours), Disp: 150 tablet, Rfl: 0   latanoprost (XALATAN) 0.005 % ophthalmic solution, Place 1 drop into both eyes daily., Disp: , Rfl:    MAGNESIUM GLYCINATE PO, Take 1 tablet by mouth every other day., Disp: , Rfl:    Multiple Vitamin (MULTIVITAMIN) tablet, Take 1 tablet by mouth daily., Disp: , Rfl:   Observations/Objective: There were no vitals filed for this visit. Physical Exam  Assessment and Plan:  1. Upper respiratory infection, viral (Primary) -  Fever,  cough, and sore throat with onset this morning. Wheezing noted, reminiscent of childhood asthma. Negative COVID-19 test. Influenza A and B status unknown. Partner had similar symptoms recently. -Obtain over-the-counter Influenza A and B test and report results. -Continue Albuterol as needed for wheezing. -Continue home remedies including vitamin C, lime juice, and apple cider vinegar gargle. -Consider Tessalon Perles for cough relief, prescription sent to [facility] in [place]. -Gargle with warm salt water for sore throat  relief. --Encouraged to maintain hydration. - benzonatate (TESSALON PERLES) 100 MG capsule; Take 1 capsule (100 mg total) by mouth 3 (three) times daily as needed.  Dispense: 20 capsule; Refill: 0  2. Mild intermittent asthma without complication  - benzonatate (TESSALON PERLES) 100 MG capsule; Take 1 capsule (100 mg total) by mouth 3 (three) times daily as needed.  Dispense: 20 capsule; Refill: 0 - albuterol (VENTOLIN HFA) 108 (90 Base) MCG/ACT inhaler; Inhale 2 puffs into the lungs every 6 (six) hours as needed for wheezing or shortness of breath.  Dispense: 8 g; Refill: 2  3. Cervicalgia -  Pain level currently at 4/10 with current medication regimen (Cymbalta 60mg  daily, Norco 7.5-325mg  every 4 hours as needed). Patient is actively seeking further evaluation with a spine specialist. -Continue current pain management regimen. -  recently consulted a spine specialist    Follow Up Instructions: No follow-ups on file.   I discussed the assessment and treatment plan with the patient. The patient was provided an opportunity to ask questions, and all were answered. The patient agreed with the plan and demonstrated an understanding of the instructions.   The patient was advised to call back or seek an in-person evaluation if the symptoms worsen or if the condition fails to improve as anticipated.  The above assessment and management plan was discussed with the patient. The patient verbalized understanding of and has agreed to the management plan.   Kenard Gower, NP

## 2023-05-19 NOTE — Progress Notes (Signed)
 This service is provided via telemedicine  No vital signs collected/recorded due to the encounter was a telemedicine visit.   Location of patient (ex: home, work):  Home   Patient consents to a telephone visit:  Yes, 08/11/22  Location of the provider (ex: office, home):  [facility]  Name of any referring provider:  [name], NP   Names of all persons participating in the telemedicine service and their role in the encounter: Gwyn Hieronymus B/CMA, Monina C. Grayce Sessions, NP , and patient  Time spent on call:  11 minutes

## 2023-05-19 NOTE — Telephone Encounter (Signed)
 Patient called to say that he tested negative for covid and flu and was told to call back with results.   FYI

## 2023-05-20 NOTE — Telephone Encounter (Signed)
 Response sent via mychart to patient.

## 2023-05-20 NOTE — Telephone Encounter (Signed)
 Yes benzonatate is used for coughing.  If you have congestion can also use MUCINEX DM by mouth twice daily for cough and congestion, to take with full glass of water

## 2023-05-24 ENCOUNTER — Other Ambulatory Visit: Payer: Self-pay | Admitting: *Deleted

## 2023-05-24 DIAGNOSIS — M542 Cervicalgia: Secondary | ICD-10-CM

## 2023-05-24 MED ORDER — HYDROCODONE-ACETAMINOPHEN 7.5-325 MG PO TABS
ORAL_TABLET | ORAL | 0 refills | Status: DC
Start: 2023-05-24 — End: 2023-06-24

## 2023-05-24 NOTE — Telephone Encounter (Signed)
 Patient requested refill.  Epic LR: 04/26/2023 Contract Date: 10/25/20 note added to upcoming appointment to update.   Pended Rx and sent to [facility] for approval.

## 2023-05-25 ENCOUNTER — Telehealth: Payer: Commercial Managed Care - PPO | Admitting: Sports Medicine

## 2023-05-29 ENCOUNTER — Other Ambulatory Visit: Payer: Self-pay | Admitting: Nurse Practitioner

## 2023-05-29 DIAGNOSIS — M48061 Spinal stenosis, lumbar region without neurogenic claudication: Secondary | ICD-10-CM

## 2023-06-24 ENCOUNTER — Other Ambulatory Visit: Payer: Self-pay | Admitting: Nurse Practitioner

## 2023-06-24 DIAGNOSIS — M542 Cervicalgia: Secondary | ICD-10-CM

## 2023-06-24 MED ORDER — HYDROCODONE-ACETAMINOPHEN 7.5-325 MG PO TABS
ORAL_TABLET | ORAL | 0 refills | Status: DC
Start: 2023-06-24 — End: 2023-07-24

## 2023-06-24 NOTE — Telephone Encounter (Signed)
 Patient is requesting a refill of the following medications: Requested Prescriptions   Pending Prescriptions Disp Refills   HYDROcodone-acetaminophen (NORCO) 7.5-325 MG tablet 150 tablet 0    Sig: Take one tablets by mouth Every 4 hours as needed pain (MAX 5 in 24 hours)    Date of last refill: 05/24/23  Refill amount: 30  Treatment agreement date: No treatment agreement on file, notation made on pending appointment for June 2025

## 2023-06-24 NOTE — Telephone Encounter (Signed)
 Copied from CRM [id number]. Topic: Clinical - Medication Refill >> Jun 24, 2023  8:59 AM Hamdi H wrote: Most Recent Primary Care Visit:  Provider: Gillis Santa  Department: [facility]  Visit Type: MYCHART VIDEO VISIT  Date: 05/19/2023  Medication: HYDROcodone-acetaminophen (NORCO) 7.5-325 MG tablet  Has the patient contacted their pharmacy? No (Agent: If no, request that the patient contact the pharmacy for the refill. If patient does not wish to contact the pharmacy document the reason why and proceed with request.) (Agent: If yes, when and what did the pharmacy advise?)  Is this the correct pharmacy for this prescription? Yes If no, delete pharmacy and type the correct one.  This is the patient's preferred pharmacy:  [facility] #[id number] [facility], [place] - [address] AT [facility] [address] [place] [address] Phone: [phone] Fax: [phone]   Has the prescription been filled recently? No  Is the patient out of the medication? Yes  Has the patient been seen for an appointment in the last year OR does the patient have an upcoming appointment? Yes  Can we respond through MyChart? No Would like a text message instead.   Agent: Please be advised that Rx refills may take up to 3 business days. We ask that you follow-up with your pharmacy.

## 2023-06-29 ENCOUNTER — Other Ambulatory Visit: Payer: Self-pay | Admitting: Adult Health

## 2023-06-29 DIAGNOSIS — J4520 Mild intermittent asthma, uncomplicated: Secondary | ICD-10-CM

## 2023-07-23 ENCOUNTER — Other Ambulatory Visit: Payer: Self-pay | Admitting: Nurse Practitioner

## 2023-07-23 DIAGNOSIS — M542 Cervicalgia: Secondary | ICD-10-CM

## 2023-07-23 NOTE — Telephone Encounter (Signed)
 Copied from CRM [id number]. Topic: Clinical - Medication Refill >> Jul 23, 2023  8:57 AM Danelle Dunning F wrote: Most Recent Primary Care Visit:  Provider: Duncan Gibson  Department: [facility]  Visit Type: MYCHART VIDEO VISIT  Date: 05/19/2023  Medication:   HYDROcodone -acetaminophen  (NORCO) 7.5-325 MG tablet  Has the patient contacted their pharmacy? Yes   Is this the correct pharmacy for this prescription? Yes  This is the patient's preferred pharmacy:   [facility]ORE #[id number] [facility], [place] - [address] AT [facility] [address] [place] [address] Phone: [phone] Fax: [phone]   Has the prescription been filled recently? Yes  Is the patient out of the medication? Yes  Has the patient been seen for an appointment in the last year OR does the patient have an upcoming appointment? Yes  Can we respond through MyChart? Yes  Agent: Please be advised that Rx refills may take up to 3 business days. We ask that you follow-up with your pharmacy.

## 2023-07-24 ENCOUNTER — Other Ambulatory Visit: Payer: Self-pay | Admitting: Internal Medicine

## 2023-07-24 DIAGNOSIS — M542 Cervicalgia: Secondary | ICD-10-CM

## 2023-07-24 MED ORDER — HYDROCODONE-ACETAMINOPHEN 7.5-325 MG PO TABS
ORAL_TABLET | ORAL | 0 refills | Status: DC
Start: 2023-07-24 — End: 2023-08-25

## 2023-07-24 NOTE — Telephone Encounter (Signed)
 I approved this already. Just want you to know

## 2023-07-24 NOTE — Progress Notes (Signed)
 Patient called as he is running out of his Norco He has Contract with [facility] There was order pinned for [employer]. I went ahead and reordered it

## 2023-07-26 NOTE — Telephone Encounter (Signed)
 Thank you :)

## 2023-08-25 ENCOUNTER — Other Ambulatory Visit: Admitting: Nurse Practitioner

## 2023-08-25 DIAGNOSIS — M542 Cervicalgia: Secondary | ICD-10-CM

## 2023-08-25 MED ORDER — HYDROCODONE-ACETAMINOPHEN 7.5-325 MG PO TABS
ORAL_TABLET | ORAL | 0 refills | Status: DC
Start: 2023-08-25 — End: 2023-09-20

## 2023-08-25 NOTE — Telephone Encounter (Signed)
 Copied from CRM [id number]. Topic: Clinical - Medication Refill >> Aug 25, 2023  2:44 PM Karole Pacer C wrote: Medication: HYDROcodone -acetaminophen  (NORCO) 7.5-325 MG tablet  Has the patient contacted their pharmacy? No (Agent: If no, request that the patient contact the pharmacy for the refill. If patient does not wish to contact the pharmacy document the reason why and proceed with request.) (Agent: If yes, when and what did the pharmacy advise?)  This is the patient's preferred pharmacy:  [facility]ORE #[id number] [facility], [place] - [address] AT [facility] [address] [place] [address] Phone: [phone] Fax: [phone]  Is this the correct pharmacy for this prescription? Yes If no, delete pharmacy and type the correct one.   Has the prescription been filled recently? Yes  Is the patient out of the medication? Yes  Has the patient been seen for an appointment in the last year OR does the patient have an upcoming appointment? Yes  Can we respond through MyChart? Yes  Agent: Please be advised that Rx refills may take up to 3 business days. We ask that you follow-up with your pharmacy.

## 2023-09-13 ENCOUNTER — Other Ambulatory Visit: Payer: Self-pay | Admitting: Nurse Practitioner

## 2023-09-13 DIAGNOSIS — M48061 Spinal stenosis, lumbar region without neurogenic claudication: Secondary | ICD-10-CM

## 2023-09-13 MED ORDER — DULOXETINE HCL 60 MG PO CPEP
60.0000 mg | ORAL_CAPSULE | Freq: Every day | ORAL | 1 refills | Status: AC
Start: 2023-09-13 — End: ?

## 2023-09-13 NOTE — Telephone Encounter (Signed)
 Copied from CRM [id number]. Topic: Clinical - Medication Refill >> Sep 13, 2023  8:23 AM Carrielelia G wrote: Medication: DULoxetine  (CYMBALTA ) 60 MG capsule  Has the patient contacted their pharmacy? Yes (Agent: If no, request that the patient contact the pharmacy for the refill. If patient does not wish to contact the pharmacy document the reason why and proceed with request.) (Agent: If yes, when and what did the pharmacy advise?)  This is the patient's preferred pharmacy:  [facility]ORE #[id number] [facility], [place] - [address] AT [facility] [address] [place] [address] Phone: [phone] Fax: [phone]  Is this the correct pharmacy for this prescription? Yes If no, delete pharmacy and type the correct one.   Has the prescription been filled recently? Yes  Is the patient out of the medication? Yes   Has the patient been seen for an appointment in the last year OR does the patient have an upcoming appointment? Yes  Can we respond through MyChart? Yes  Agent: Please be advised that Rx refills may take up to 3 business days. We ask that you follow-up with your pharmacy.

## 2023-09-20 ENCOUNTER — Ambulatory Visit (INDEPENDENT_AMBULATORY_CARE_PROVIDER_SITE_OTHER): Payer: 59 | Admitting: Nurse Practitioner

## 2023-09-20 ENCOUNTER — Encounter: Payer: Self-pay | Admitting: Nurse Practitioner

## 2023-09-20 VITALS — BP 112/78 | HR 75 | Temp 98.0°F | Resp 18 | Ht 72.0 in | Wt 202.8 lb

## 2023-09-20 DIAGNOSIS — E785 Hyperlipidemia, unspecified: Secondary | ICD-10-CM

## 2023-09-20 DIAGNOSIS — F411 Generalized anxiety disorder: Secondary | ICD-10-CM | POA: Diagnosis not present

## 2023-09-20 DIAGNOSIS — N401 Enlarged prostate with lower urinary tract symptoms: Secondary | ICD-10-CM

## 2023-09-20 DIAGNOSIS — Z79899 Other long term (current) drug therapy: Secondary | ICD-10-CM

## 2023-09-20 DIAGNOSIS — F1120 Opioid dependence, uncomplicated: Secondary | ICD-10-CM | POA: Diagnosis not present

## 2023-09-20 DIAGNOSIS — M542 Cervicalgia: Secondary | ICD-10-CM

## 2023-09-20 DIAGNOSIS — R3912 Poor urinary stream: Secondary | ICD-10-CM

## 2023-09-20 DIAGNOSIS — Z23 Encounter for immunization: Secondary | ICD-10-CM

## 2023-09-20 MED ORDER — TAMSULOSIN HCL 0.4 MG PO CAPS
0.4000 mg | ORAL_CAPSULE | Freq: Every day | ORAL | 3 refills | Status: DC
Start: 2023-09-20 — End: 2023-12-23

## 2023-09-20 MED ORDER — HYDROCODONE-ACETAMINOPHEN 7.5-325 MG PO TABS: ORAL_TABLET | ORAL | 0 refills | Status: DC

## 2023-09-20 NOTE — Assessment & Plan Note (Signed)
 Worsening of symptoms, will start flomax 0.4 mg by mouth daily to manage symptoms

## 2023-09-20 NOTE — Progress Notes (Signed)
 Careteam: Patient Care Team: James Harlene POUR, NP as PCP - General (Geriatric Medicine) James Lerner, MD as Consulting Physician ([facility] James Dianna Specking, MD as Consulting Physician (Gastroenterology)  PLACE OF SERVICE:  [facility]  Advanced Directive information    Allergies  Allergen Reactions   Bee Venom Anaphylaxis   Codeine Nausea And Vomiting   Skelaxin [Metaxalone] Other (See Comments)    Lethargic    Yellow Jacket Venom Anaphylaxis   Augmentin [Amoxicillin-Pot Clavulanate] Nausea And Vomiting   Sedapap [Butalbital-Acetaminophen ] Other (See Comments)    Unknown reaction    Wellbutrin [Bupropion]     Altered mental state    Chief Complaint  Patient presents with   Medical Management of Chronic Issues    6 mths/needs updated contract. Needs to discuss colonoscopy and Pneumonia vaccine.    HPI:  Discussed the use of AI scribe software for clinical note transcription with the patient, who gave verbal consent to proceed.  History of Present Illness James [name] is a 63 year old male who presents for a routine follow-up visit.  He is compliant with his CPAP machine for sleep apnea, which helps him sleep well. He consistently uses eye drops for glaucoma and never misses a dose.  He uses hydrocodone  variably, taking more on days when pain is severe. He previously had neck issues and a temporary TMJ problem following dental work, which he initially mistook for an earache. The TMJ symptoms have mostly resolved over time.  He is on Cymbalta  and reports doing well with mood, though he feels a bit low due to reduced activity levels. He maintains physical activity by mowing the yard and keeping up with household responsibilities alongside his partner.  He underwent hip surgery and had a quick recovery. He has lost some weight and is working on maintaining a healthy BMI through diet changes, including regular consumption of yogurt with  fruits and prunes.  He experiences difficulty starting urination and dribbling afterward, which is a new development over the past few weeks. He recalls previously using finasteride  for similar symptoms. [name] burning with urination, increased frequency, urgency, palpitations, or chest pain. [name] issues with bowel regularity.  He recalls having a Pneumovax vaccination but is unsure of the timing, estimating it may have been over ten years ago.    Review of Systems:  Review of Systems  Constitutional:  Negative for chills, fever and weight loss.  HENT:  Negative for tinnitus.   Respiratory:  Negative for cough, sputum production and shortness of breath.   Cardiovascular:  Negative for chest pain, palpitations and leg swelling.  Gastrointestinal:  Negative for abdominal pain, constipation, diarrhea and heartburn.  Genitourinary:  Negative for dysuria, frequency and urgency.  Musculoskeletal:  Positive for neck pain. Negative for back pain, falls, joint pain and myalgias.  Skin: Negative.   Neurological:  Negative for dizziness and headaches.  Psychiatric/Behavioral:  Negative for depression and memory loss. The patient does not have insomnia.     Past Medical History:  Diagnosis Date   Anxiety    Arthritis    Asthma    chldhood asthma   BPH (benign prostatic hyperplasia)    Cervicalgia    degenerative changes and foraminal stenoses   Chest pain, unspecified    Depression    Family history of colonic polyps    Father with benign colon polyp   Hepatitis B antibody positive 1984   Hiatal hernia 09/19/2010   History of kidney stones  Hyperglycemia    104 on 10/10/10   Hyperlipidemia    Obstructive sleep apnea (adult) (pediatric)    using CPAP   Other malaise and fatigue    Sinus infection    diagnosed on 05/30/22 by cpcp - tx with amoxicillin and prednisone    Supraventricular premature beats    noted on EKG 05/18/12   Wrist pain, left    Past Surgical History:  Procedure  Laterality Date   COLON SURGERY  09/17/2008   poylps/hemorriods   colonoscocpy      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 His smoking use included cigarettes. He has never used smokeless tobacco. He reports that he does not drink alcohol and does not use drugs.  Family History  Problem Relation Age of Onset   Heart disease Mother    Hypertension Mother    Diabetes Mother    Depression Mother    Cancer Father        2004 bladder cancer   Alcohol abuse Father    Heart Problems Sister        Visual merchandiser insert    Medications: Patient's Medications  New Prescriptions   TAMSULOSIN (FLOMAX) 0.4 MG CAPS CAPSULE    Take 1 capsule (0.4 mg total) by mouth daily.  Previous Medications   ALBUTEROL  (VENTOLIN  HFA) 108 (90 BASE) MCG/ACT INHALER    INHALE 2 PUFFS INTO THE LUNGS EVERY 6 HOURS AS NEEDED FOR WHEEZING OR SHORTNESS OF BREATH   ASCORBIC ACID (VITAMIN C PO)    Take 1 tablet by mouth daily.   B COMPLEX VITAMINS CAPSULE    Take 1 capsule by mouth every other day.   CALCIUM PO    Take 1 tablet by mouth daily.   DULOXETINE  (CYMBALTA ) 60 MG CAPSULE    Take 1 capsule (60 mg total) by mouth daily.   EPINEPHRINE  0.3 MG/0.3 ML IJ SOAJ INJECTION    Inject 0.11ml into the muscle as needed for allergic reaction   LATANOPROST  (XALATAN ) 0.005 % OPHTHALMIC SOLUTION    Place 1 drop into both eyes daily.   MAGNESIUM GLYCINATE PO    Take 1 tablet by mouth every other day.   MULTIPLE VITAMIN (MULTIVITAMIN) TABLET    Take 1 tablet by mouth daily.  Modified Medications   Modified Medication Previous Medication   HYDROCODONE -ACETAMINOPHEN  (NORCO) 7.5-325 MG TABLET HYDROcodone -acetaminophen  (NORCO) 7.5-325 MG tablet      Take one tablets by mouth Every 4 hours as needed  pain (MAX 5 in 24 hours)    Take one tablets by mouth Every 4 hours as needed pain (MAX 5 in 24 hours)  Discontinued Medications   BENZONATATE  (TESSALON  PERLES) 100 MG CAPSULE    Take 1 capsule (100 mg total) by mouth 3 (three) times daily as needed.    Physical Exam:  Vitals:   09/20/23 1005  BP: 112/78  Pulse: 75  Resp: 18  Temp: 98 F (36.7 C)  SpO2: 96%  Weight: 202 lb 12.8 oz (92 kg)  Height: 6' (1.829 m)   Body mass index is 27.5 kg/m. Wt Readings from Last 3 Encounters:  09/20/23 202 lb 12.8 oz (92 kg)  03/25/23 207 lb 6.4 oz (94.1 kg)  03/15/23 203 lb (92.1 kg)    Physical Exam  Constitutional:      General: He is not in acute distress.    Appearance: He is well-developed. He is not diaphoretic.  HENT:     Head: Normocephalic and atraumatic.     Right Ear: External ear normal.     Left Ear: External ear normal.     Mouth/Throat:     Pharynx: [name] oropharyngeal exudate.   Eyes:     Conjunctiva/sclera: Conjunctivae normal.     Pupils: Pupils are equal, round, and reactive to light.    Cardiovascular:     Rate and Rhythm: Normal rate and regular rhythm.     Heart sounds: Normal heart sounds.  Pulmonary:     Effort: Pulmonary effort is normal.     Breath sounds: Normal breath sounds.  Abdominal:     General: Bowel sounds are normal.     Palpations: Abdomen is soft.   Musculoskeletal:        General: [name] tenderness.     Cervical back: Normal range of motion and neck supple.     Right lower leg: [name] edema.     Left lower leg: [name] edema.   Skin:    General: Skin is warm and dry.   Neurological:     Mental Status: He is alert and oriented to person, place, and time.     Labs reviewed: Basic Metabolic Panel: Recent Labs    03/15/23 1100  NA 139  K 4.5  CL 106  CO2 26  GLUCOSE 96  BUN 16  CREATININE 0.90  CALCIUM 9.6   Liver Function Tests: Recent Labs    03/15/23 1100  AST 16  ALT 13  BILITOT 0.4  PROT 6.9   [name] results for input(s):  LIPASE, AMYLASE in the last 8760 hours. [name] results for input(s): AMMONIA in the last 8760 hours. CBC: Recent Labs    03/15/23 1100  WBC 5.4  NEUTROABS 2,754  HGB 14.2  HCT 42.5  MCV 84.2  PLT 219   Lipid Panel: Recent Labs    03/15/23 1100  CHOL 261*  HDL 71  LDLCALC 159*  TRIG 164*  CHOLHDL 3.7   TSH: [name] results for input(s): TSH in the last 8760 hours. A1C: Lab Results  Component Value Date   HGBA1C 5.5 02/01/2020     Assessment/Plan Benign prostatic hyperplasia with weak urinary stream Assessment & Plan: Worsening of symptoms, will start flomax 0.4 mg by mouth daily to manage symptoms   Orders: -     PSA -     Tamsulosin HCl; Take 1 capsule (0.4 mg total) by mouth daily.  Dispense: 30 capsule; Refill: 3  Anxiety state Assessment & Plan: Well controlled on cymbalta     Hyperlipidemia, unspecified hyperlipidemia type Assessment & Plan: Continues with dietary modifications  Orders: -     Lipid panel -     CBC with Differential/Platelet -     COMPLETE METABOLIC PANEL WITHOUT GFR  Uncomplicated opioid dependence (HCC) Assessment & Plan: Continues on hydrocodone -apap   High risk medication use -     DRUG MONITORING, PANEL 6 WITH CONFIRMATION, URINE  Cervicalgia Assessment & Plan: Ongoing, managed with hydrocodone /apap   Orders: -     HYDROcodone -Acetaminophen ; Take one tablets by mouth Every 4 hours as needed pain (MAX 5 in 24 hours)  Dispense: 150 tablet; Refill: 0  Need for pneumococcal vaccination -     Pneumococcal conjugate vaccine 20-valent     Return in about 6 months (around 03/21/2024) for routine follow  up.  Soliana Kitko K. James BODILY [facility] [phone]

## 2023-09-20 NOTE — Assessment & Plan Note (Signed)
Well controlled on cymbalta

## 2023-09-20 NOTE — Assessment & Plan Note (Signed)
 Continues with dietary modifications

## 2023-09-20 NOTE — Assessment & Plan Note (Signed)
 Continues on hydrocodone -apap

## 2023-09-20 NOTE — Assessment & Plan Note (Signed)
 Ongoing, managed with hydrocodone /apap

## 2023-09-21 ENCOUNTER — Ambulatory Visit: Payer: Self-pay | Admitting: Nurse Practitioner

## 2023-09-21 LAB — CBC WITH DIFFERENTIAL/PLATELET
Absolute Lymphocytes: 1684 {cells}/uL (ref 850–3900)
Absolute Monocytes: 368 {cells}/uL (ref 200–950)
Basophils Absolute: 51 {cells}/uL (ref 0–200)
Basophils Relative: 1.1 %
Eosinophils Absolute: 327 {cells}/uL (ref 15–500)
Eosinophils Relative: 7.1 %
HCT: 43.1 % (ref 38.5–50.0)
Hemoglobin: 13.6 g/dL (ref 13.2–17.1)
MCH: 27.5 pg (ref 27.0–33.0)
MCHC: 31.6 g/dL — ABNORMAL LOW (ref 32.0–36.0)
MCV: 87.2 fL (ref 80.0–100.0)
MPV: 10.7 fL (ref 7.5–12.5)
Monocytes Relative: 8 %
Neutro Abs: 2171 {cells}/uL (ref 1500–7800)
Neutrophils Relative %: 47.2 %
Platelets: 203 10*3/uL (ref 140–400)
RBC: 4.94 10*6/uL (ref 4.20–5.80)
RDW: 13.1 % (ref 11.0–15.0)
Total Lymphocyte: 36.6 %
WBC: 4.6 10*3/uL (ref 3.8–10.8)

## 2023-09-21 LAB — COMPREHENSIVE METABOLIC PANEL WITH GFR
AG Ratio: 1.8 (calc) (ref 1.0–2.5)
ALT: 18 U/L (ref 9–46)
AST: 20 U/L (ref 10–35)
Albumin: 4.4 g/dL (ref 3.6–5.1)
Alkaline phosphatase (APISO): 48 U/L (ref 35–144)
BUN: 21 mg/dL (ref 7–25)
CO2: 23 mmol/L (ref 20–32)
Calcium: 9.5 mg/dL (ref 8.6–10.3)
Chloride: 105 mmol/L (ref 98–110)
Creat: 0.88 mg/dL (ref 0.70–1.35)
Globulin: 2.5 g/dL (ref 1.9–3.7)
Glucose, Bld: 93 mg/dL (ref 65–99)
Potassium: 4.6 mmol/L (ref 3.5–5.3)
Sodium: 137 mmol/L (ref 135–146)
Total Bilirubin: 0.6 mg/dL (ref 0.2–1.2)
Total Protein: 6.9 g/dL (ref 6.1–8.1)
eGFR: 95 mL/min/{1.73_m2} (ref >=60)

## 2023-09-21 LAB — LIPID PANEL
Cholesterol: 260 mg/dL — ABNORMAL HIGH (ref <200)
HDL: 76 mg/dL (ref >=40)
LDL Cholesterol (Calc): 162 mg/dL — ABNORMAL HIGH
Non-HDL Cholesterol (Calc): 184 mg/dL — ABNORMAL HIGH (ref <130)
Total CHOL/HDL Ratio: 3.4 (calc) (ref <5.0)
Triglycerides: 108 mg/dL (ref <150)

## 2023-09-21 LAB — PSA: PSA: 1.34 ng/mL (ref <=4.00)

## 2023-09-22 ENCOUNTER — Other Ambulatory Visit: Payer: Self-pay | Admitting: Nurse Practitioner

## 2023-09-22 DIAGNOSIS — E785 Hyperlipidemia, unspecified: Secondary | ICD-10-CM

## 2023-09-22 LAB — DRUG MONITORING, PANEL 6 WITH CONFIRMATION, URINE
6 Acetylmorphine: NEGATIVE ng/mL (ref <10)
Alcohol Metabolites: NEGATIVE ng/mL (ref <500)
Amphetamines: NEGATIVE ng/mL (ref <500)
Barbiturates: NEGATIVE ng/mL (ref <300)
Benzodiazepines: NEGATIVE ng/mL (ref <100)
Cocaine Metabolite: NEGATIVE ng/mL (ref <150)
Codeine: NEGATIVE ng/mL (ref <50)
Creatinine: 25.7 mg/dL (ref >=20.0)
Hydrocodone: 374 ng/mL — ABNORMAL HIGH (ref <50)
Hydromorphone: 57 ng/mL — ABNORMAL HIGH (ref <50)
Marijuana Metabolite: NEGATIVE ng/mL (ref <20)
Methadone Metabolite: NEGATIVE ng/mL (ref <100)
Morphine: NEGATIVE ng/mL (ref <50)
Norhydrocodone: 366 ng/mL — ABNORMAL HIGH (ref <50)
Opiates: POSITIVE ng/mL — AB (ref <100)
Oxidant: NEGATIVE ug/mL (ref <200)
Oxycodone: NEGATIVE ng/mL (ref <100)
Phencyclidine: NEGATIVE ng/mL (ref <25)
pH: 6 (ref 4.5–9.0)

## 2023-09-22 LAB — DM TEMPLATE

## 2023-09-22 MED ORDER — ROSUVASTATIN CALCIUM 5 MG PO TABS: ORAL_TABLET | ORAL | 3 refills | Status: DC

## 2023-09-22 NOTE — Progress Notes (Signed)
 Left message on voicemail for patient to return call when available

## 2023-09-22 NOTE — Progress Notes (Signed)
 To start crestor 5 mg by mouth three times weekly-  He can half tablet for the first 2 weeks to make sure he does not have side effects (such has muscle aches or pain)

## 2023-09-23 NOTE — Progress Notes (Signed)
Called patient and no answer. Voicemail was left with office call back number.   

## 2023-09-28 NOTE — Progress Notes (Signed)
Patient Notified and agreed. 

## 2023-09-28 NOTE — Progress Notes (Signed)
 Left message on voicemail for patient to return call when available

## 2023-10-01 ENCOUNTER — Ambulatory Visit: Payer: Self-pay

## 2023-10-01 NOTE — Telephone Encounter (Signed)
Message routed to covering provider

## 2023-10-01 NOTE — Telephone Encounter (Signed)
 FYI Only or Action Required?: Action required by provider: clinical question for provider.  Patient was last seen in primary care on 09/20/2023 by Caro Harlene POUR, NP.  Called Nurse Triage reporting Medication Reaction.  Symptoms began yesterday.  Interventions attempted: Rest, hydration, or home remedies.  Symptoms are: unchanged.  Triage Disposition: Information or Advice Only Call  Patient/caregiver understands and will follow disposition?: YesCopied from CRM [id number]. Topic: Clinical - Red Word Triage >> Oct 01, 2023 12:56 PM Adrianna P wrote: Red Word that prompted transfer to Nurse Triage:Having medication reaction. weak forearms, breathing shallow, very thirsty, sleepy, constipated, wheezing/ hoarseness Reason for Disposition  Caller has medicine question only, adult not sick, AND triager answers question  Answer Assessment - Initial Assessment Questions 1. NAME of MEDICINE: What medicine(s) are you calling about?     Lipitor  2. QUESTION: What is your question or concern? (e.g., side effect, took wrong dose, how to take, need more medicine)     Side  3. PRESCRIBER: Who prescribed the medicine? The triager should check to see if the medicine is on the patient's hospice medication list.     [place]  4. REFILL NEEDED: If additional supply or refill of medicine is needed, ask: How much medicine do you have left right now? (e.g., how many pills, how many days' supply)     Na  5. SYMPTOMS: Do you (your loved one; patient) have any symptoms? If Yes, ask: What symptoms are you having? How bad are the symptoms? (e.g., mild, moderate, severe)     na 6. CALLER's COPING: How are you doing? How are other family members and loved ones doing?     Lying on bed   Pt started taking Lipitor on 7/9. Pt took 1/2 tab Wednesday and today. Pt started feeling extremely tired, muscle tiredness, breathing shallow, very thirsty, constipated, and hoarseness. Pt is asking if this are  normal side effects. RN explained those side effects are normal but to watch the shallow breathing and seek care at ED/UC if breathing difficulty intensifies. Pt stated he was going to rest and drink more water  today and will seek care if symptoms worsen. Pt is asking if medication can be continued. Next dose is due Tuesday. Please advise.  Answer Assessment - Initial Assessment Questions 1. NAME of MEDICINE: What medicine(s) are you calling about?     Lipitor 2. QUESTION: What is your question? (e.g., double dose of medicine, side effect)     Should I continue medication?  3. PRESCRIBER: Who prescribed the medicine? Reason: if prescribed by specialist, call should be referred to that group.     [place]  4. SYMPTOMS: Do you have any symptoms? If Yes, ask: What symptoms are you having?  How bad are the symptoms (e.g., mild, moderate, severe)     Fatigue, shallow breathing, thirsty, muscle soreness, constipation.     Pt started taking Lipitor on 7/9. Pt took 1/2 tab Wednesday and today. Pt started feeling extremely tired, muscle tiredness, breathing shallow, very thirsty, constipated, and hoarseness. Pt is asking if this are normal side effects. RN explained those side effects are normal but to watch the shallow breathing and seek care at ED/UC if breathing difficulty intensifies. Pt stated he was going to rest and drink more water  today and will seek care if symptoms worsen. Pt is asking if medication can be continued. Next dose is due Tuesday. Please advise.  Protocols used: Hospice - Medication Question or Refill Call-A-[facility], Medication Question Call-A-[facility]

## 2023-10-05 NOTE — Telephone Encounter (Signed)
 I called patient and informed him of what Mast Man, NP recommended. Patient states he's still having some weakness in arm area but has last taken medication during the weekend. Patient also stated that he wasn't fasting for this lab so his numbers don't reflect what his actual levels could be. Patient states that he will focus on monitoring his diet for now. Patient will be seen tomorrow 10/06/2023 by Dr.Veludandi. Patient called about CRESTOR  and NOT LIPITOR.

## 2023-10-05 NOTE — Telephone Encounter (Signed)
 Mast, Man X, NP to Me  (Selected Message)     10/01/23  2:57 PM Suggest the patient to hold Lipitor to monitor her symptoms for now.    Thanks

## 2023-10-06 ENCOUNTER — Encounter: Payer: Self-pay | Admitting: Sports Medicine

## 2023-10-06 ENCOUNTER — Ambulatory Visit (INDEPENDENT_AMBULATORY_CARE_PROVIDER_SITE_OTHER): Admitting: Sports Medicine

## 2023-10-06 VITALS — BP 126/88 | HR 103 | Temp 97.8°F | Resp 17 | Ht 72.0 in | Wt 201.1 lb

## 2023-10-06 DIAGNOSIS — M791 Myalgia, unspecified site: Secondary | ICD-10-CM | POA: Diagnosis not present

## 2023-10-06 DIAGNOSIS — R0602 Shortness of breath: Secondary | ICD-10-CM | POA: Diagnosis not present

## 2023-10-06 NOTE — Progress Notes (Signed)
 Careteam: Patient Care Team: Caro Harlene POUR, NP as PCP - General (Geriatric Medicine) Melodi Lerner, MD as Consulting Physician ([facility] Camelia Dianna Specking, MD as Consulting Physician (Gastroenterology)  PLACE OF SERVICE:  [facility]  Advanced Directive information    Allergies  Allergen Reactions   Bee Venom Anaphylaxis   Codeine Nausea And Vomiting   Skelaxin [Metaxalone] Other (See Comments)    Lethargic    Yellow Jacket Venom Anaphylaxis   Augmentin [Amoxicillin-Pot Clavulanate] Nausea And Vomiting   Sedapap [Butalbital-Acetaminophen ] Other (See Comments)    Unknown reaction    Wellbutrin [Bupropion]     Altered mental state    Chief Complaint  Patient presents with   Acute Visit    Allergic reaction to rosuvastatin  made patient very sick       Discussed the use of AI scribe software for clinical note transcription with the patient, who gave verbal consent to proceed.  History of Present Illness  He presents with muscle aches and shortness of breath after starting Crestor .  He started Crestor  on Wednesday, taking 2.5 mg after cutting the pill in half. Initially, he was fine, but by Friday, he experienced trouble breathing, which progressed to shallow breathing. By Saturday, he felt completely washed out with body aches from his wrists to his shoulders, describing the muscle pain as significant enough to require focus to lift a cup of coffee. The breathing difficulty was the most concerning symptom, making it hard to get up to go to the bathroom but has resolved since then.  He describes feeling fatigued and 'down' until the day before the visit, with persistent achiness in his wrists. He stopped taking Crestor  after these symptoms developed .  He has a history of asthma as a child and uses a CPAP machine for sleep apnea, which he has been compliant with for eight to nine years. No current chest pain, pain with urination, or blood in  urine or stool. He typically walks briskly for exercise but was unable to do so over the weekend due to his symptoms, though he managed a small stroll on Sunday.    No fever, maintains a good appetite, and drinks plenty of water . He does not smoke or drink alcohol and exercises regularly. He is concerned about his cholesterol levels and discusses his diet, including a fondness for cheese and whole milk.  The 10-year ASCVD risk score (Arnett DK, et al., 2019) is: 11.7%   Values used to calculate the score:     Age: 63 years     Clincally relevant sex: Male     Is Non-Hispanic African American: No     Diabetic: No     Tobacco smoker: No     Systolic Blood Pressure: 126 mmHg     Is BP treated: No     HDL Cholesterol: 76 mg/dL     Total Cholesterol: 260 mg/dL   Review of Systems:  Review of Systems  Constitutional:  Negative for chills and fever.  HENT:  Negative for congestion and sore throat.   Eyes:  Negative for double vision.  Respiratory:  Positive for shortness of breath. Negative for cough and sputum production.   Cardiovascular:  Negative for chest pain, palpitations and leg swelling.  Gastrointestinal:  Negative for abdominal pain, heartburn and nausea.  Genitourinary:  Negative for dysuria, frequency and hematuria.  Musculoskeletal:  Positive for myalgias. Negative for falls.  Neurological:  Negative for dizziness.   Negative unless indicated in HPI.  Past Medical History:  Diagnosis Date   Anxiety    Arthritis    Asthma    chldhood asthma   BPH (benign prostatic hyperplasia)    Cervicalgia    degenerative changes and foraminal stenoses   Chest pain, unspecified    Depression    Family history of colonic polyps    Father with benign colon polyp   Hepatitis B antibody positive 1984   Hiatal hernia 09/19/2010   History of kidney stones    Hyperglycemia    104 on 10/10/10   Hyperlipidemia    Obstructive sleep apnea (adult) (pediatric)    using CPAP   Other  malaise and fatigue    Sinus infection    diagnosed on 05/30/22 by cpcp - tx with amoxicillin and prednisone    Supraventricular premature beats    noted on EKG 05/18/12   Wrist pain, left    Past Surgical History:  Procedure Laterality Date   COLON SURGERY  09/17/2008   poylps/hemorriods   colonoscocpy      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 His smoking use included cigarettes. He has never used smokeless tobacco. He reports that he does not drink alcohol and does not use drugs.  Family History  Problem Relation Age of Onset   Heart disease Mother    Hypertension Mother    Diabetes Mother    Depression Mother    Cancer Father        2004 bladder cancer   Alcohol abuse Father    Heart Problems Sister        Visual merchandiser insert    Medications: Patient's Medications  New Prescriptions   No medications on file  Previous Medications   ALBUTEROL  (VENTOLIN  HFA) 108 (90 BASE) MCG/ACT INHALER    INHALE 2 PUFFS INTO THE LUNGS EVERY 6 HOURS AS NEEDED FOR WHEEZING OR SHORTNESS OF BREATH   ASCORBIC ACID (VITAMIN C PO)    Take 1 tablet by mouth daily.   B COMPLEX VITAMINS CAPSULE    Take 1 capsule by mouth every other day.   CALCIUM  PO    Take 1 tablet by mouth daily.   DULOXETINE  (CYMBALTA ) 60 MG CAPSULE    Take 1 capsule (60 mg total) by mouth daily.   EPINEPHRINE  0.3 MG/0.3 ML IJ SOAJ INJECTION    Inject 0.3ml into the muscle as needed for allergic reaction   HYDROCODONE -ACETAMINOPHEN  (NORCO) 7.5-325 MG TABLET    Take one tablets by mouth Every 4 hours as needed pain (MAX 5 in 24 hours)   LATANOPROST  (XALATAN ) 0.005 % OPHTHALMIC SOLUTION    Place 1 drop into both eyes daily.   MAGNESIUM GLYCINATE PO    Take 1 tablet by mouth every other day.    MULTIPLE VITAMIN (MULTIVITAMIN) TABLET    Take 1 tablet by mouth daily.   ROSUVASTATIN  (CRESTOR ) 5 MG TABLET    1 tablet by mouth 3 times weekly   TAMSULOSIN  (FLOMAX ) 0.4 MG CAPS CAPSULE    Take 1 capsule (0.4 mg total) by mouth daily.  Modified Medications   No medications on file  Discontinued Medications   No medications on file    Physical Exam: Vitals:   10/06/23 1341  BP: 126/88  Pulse: (!) 103  Resp:  17  Temp: 97.8 F (36.6 C)  SpO2: 93%  Weight: 201 lb 1.6 oz (91.2 kg)  Height: 6' (1.829 m)   Body mass index is 27.27 kg/m. BP Readings from Last 3 Encounters:  10/06/23 126/88  09/20/23 112/78  03/25/23 121/78   Wt Readings from Last 3 Encounters:  10/06/23 201 lb 1.6 oz (91.2 kg)  09/20/23 202 lb 12.8 oz (92 kg)  03/25/23 207 lb 6.4 oz (94.1 kg)    Physical Exam Constitutional:      Appearance: Normal appearance.  HENT:     Head: Normocephalic and atraumatic.     Mouth/Throat:     Comments: No swelling of the tongue or lip   Cardiovascular:     Rate and Rhythm: Normal rate and regular rhythm.     Pulses: Normal pulses.     Heart sounds: Normal heart sounds.  Pulmonary:     Effort: No respiratory distress.     Breath sounds: No stridor. No wheezing or rales.  Abdominal:     General: Bowel sounds are normal. There is no distension.     Palpations: Abdomen is soft.     Tenderness: There is no abdominal tenderness. There is no guarding.  Neurological:     Mental Status: He is alert. Mental status is at baseline.     Motor: No weakness.     Labs reviewed: Basic Metabolic Panel: Recent Labs    03/15/23 1100 09/20/23 1053  NA 139 137  K 4.5 4.6  CL 106 105  CO2 26 23  GLUCOSE 96 93  BUN 16 21  CREATININE 0.90 0.88  CALCIUM  9.6 9.5   Liver Function Tests: Recent Labs    03/15/23 1100 09/20/23 1053  AST 16 20  ALT 13 18  BILITOT 0.4 0.6  PROT 6.9 6.9   No results for input(s): LIPASE, AMYLASE in the last 8760 hours. No results for  input(s): AMMONIA in the last 8760 hours. CBC: Recent Labs    03/15/23 1100 09/20/23 1053  WBC 5.4 4.6  NEUTROABS 2,754 2,171  HGB 14.2 13.6  HCT 42.5 43.1  MCV 84.2 87.2  PLT 219 203   Lipid Panel: Recent Labs    03/15/23 1100 09/20/23 1053  CHOL 261* 260*  HDL 71 76  LDLCALC 159* 162*  TRIG 164* 108  CHOLHDL 3.7 3.4   TSH: No results for input(s): TSH in the last 8760 hours. A1C: Lab Results  Component Value Date   HGBA1C 5.5 02/01/2020    Assessment and Plan Assessment & Plan  1. SOB (shortness of breath) (Primary)  Sob resolved Pt able to speak in full sentences Does not appear to be in distress O2 sat  93% O/e lungs clear , no wheezing Oral cavity - no swelling of lips or tongue   2. Myalgia  Pt developed allergic reaction with muscle aches, fatigue, hoarseness, and shortness of breath resolved after discontinuation. Crestor  added to allergy list. Discontinue Crestor . Myalgia improving

## 2023-10-26 ENCOUNTER — Other Ambulatory Visit: Payer: Self-pay | Admitting: Nurse Practitioner

## 2023-10-26 DIAGNOSIS — M542 Cervicalgia: Secondary | ICD-10-CM

## 2023-10-26 MED ORDER — HYDROCODONE-ACETAMINOPHEN 7.5-325 MG PO TABS
ORAL_TABLET | ORAL | 0 refills | Status: DC
Start: 2023-10-26 — End: 2023-11-23

## 2023-10-26 NOTE — Telephone Encounter (Signed)
 Patient has request for refill on 10/26/2023. Patient last refill was 09/20/2023. Patient has contract on file dated 09/22/2023. Patient has upcoming appointment 03/28/2023. Updated contract/Sign contract was added to appointment notes. Medication pend and sent to PCP Arch, Jessica K, NP) for approval.

## 2023-10-26 NOTE — Telephone Encounter (Signed)
 Copied from CRM #[id number]. Topic: Clinical - Medication Refill >> Oct 26, 2023  8:42 AM Diannia H wrote: Medication: HYDROcodone -acetaminophen  (NORCO) 7.5-325 MG tablet  Has the patient contacted their pharmacy?  (Agent: If no, request that the patient contact theYes pharmacy for the refill. If patient does not wish to contact the pharmacy document the reason why and proceed with request.) (Agent: If yes, when and what did the pharmacy advise?)  This is the patient's preferred pharmacy:  [facility] #[id number] [facility], [place] - [address] AT [facility] [address] [place] [address] Phone: [phone] Fax: [phone]  Is this the correct pharmacy for this prescription? Yes If no, delete pharmacy and type the correct one.   Has the prescription been filled recently? Yes  Is the patient out of the medication? Yes  Has the patient been seen for an appointment in the last year OR does the patient have an upcoming appointment? Yes  Can we respond through MyChart? Yes  Agent: Please be advised that Rx refills may take up to 3 business days. We ask that you follow-up with your pharmacy.

## 2023-11-23 ENCOUNTER — Other Ambulatory Visit: Payer: Self-pay | Admitting: Nurse Practitioner

## 2023-11-23 DIAGNOSIS — M542 Cervicalgia: Secondary | ICD-10-CM

## 2023-11-23 MED ORDER — HYDROCODONE-ACETAMINOPHEN 7.5-325 MG PO TABS: ORAL_TABLET | ORAL | 0 refills | Status: DC

## 2023-11-23 NOTE — Telephone Encounter (Signed)
 Patient is requesting a refill of the following medications: Requested Prescriptions   Pending Prescriptions Disp Refills   HYDROcodone -acetaminophen  (NORCO) 7.5-325 MG tablet 150 tablet 0    Sig: Take one tablets by mouth Every 4 hours as needed pain (MAX 5 in 24 hours)    Date of last refill: 10/26/23  Refill amount: 150  Treatment agreement date: 09/20/23

## 2023-11-23 NOTE — Telephone Encounter (Signed)
 Copied from CRM #[id number]. Topic: Clinical - Medication Refill >> Nov 23, 2023 11:37 AM Vivian Z wrote: Medication: HYDROcodone -acetaminophen   Has the patient contacted their pharmacy? Yes (Agent: If no, request that the patient contact the pharmacy for the refill. If patient does not wish to contact the pharmacy document the reason why and proceed with request.) (Agent: If yes, when and what did the pharmacy advise?)  This is the patient's preferred pharmacy:  [facility] #[id number] [facility] [place] - [address] AT [facility] [address] [place] [address] Phone: [phone] Fax: [phone]  Is this the correct pharmacy for this prescription? Yes If no, delete pharmacy and type the correct one.   Has the prescription been filled recently? No  Is the patient out of the medication? Yes  Has the patient been seen for an appointment in the last year OR does the patient have an upcoming appointment? Yes  Can we respond through MyChart? No  Agent: Please be advised that Rx refills may take up to 3 business days. We ask that you follow-up with your pharmacy.

## 2023-12-23 ENCOUNTER — Other Ambulatory Visit: Payer: Self-pay | Admitting: Nurse Practitioner

## 2023-12-23 ENCOUNTER — Telehealth: Payer: Self-pay

## 2023-12-23 DIAGNOSIS — M542 Cervicalgia: Secondary | ICD-10-CM

## 2023-12-23 DIAGNOSIS — N401 Enlarged prostate with lower urinary tract symptoms: Secondary | ICD-10-CM

## 2023-12-23 MED ORDER — TAMSULOSIN HCL 0.4 MG PO CAPS: 0.4000 mg | ORAL_CAPSULE | Freq: Every day | ORAL | 3 refills | Status: AC

## 2023-12-23 MED ORDER — HYDROCODONE-ACETAMINOPHEN 7.5-325 MG PO TABS: ORAL_TABLET | ORAL | 0 refills | Status: DC

## 2023-12-23 NOTE — Telephone Encounter (Signed)
 Patient aware of response, plans to continue tamsulosin , and will call if he'd like a referral.

## 2023-12-23 NOTE — Telephone Encounter (Signed)
 Copied from CRM #[id number]. Topic: Clinical - Medication Refill >> Dec 23, 2023 11:01 AM Graeme ORN wrote: Medication: HYDROcodone -acetaminophen  (NORCO) 7.5-325 MG tablet  Has the patient contacted their pharmacy? No (Agent: If no, request that the patient contact the pharmacy for the refill. If patient does not wish to contact the pharmacy document the reason why and proceed with request.) (Agent: If yes, when and what did the pharmacy advise?)  This is the patient's preferred pharmacy:  [facility] #[id number] [facility] [place] - [address] AT [facility] [address] [place] [address] Phone: [phone] Fax: [phone]  Is this the correct pharmacy for this prescription? Yes If no, delete pharmacy and type the correct one.   Has the prescription been filled recently? Yes  Is the patient out of the medication? No  Has the patient been seen for an appointment in the last year OR does the patient have an upcoming appointment? Yes  Can we respond through MyChart? No  Agent: Please be advised that Rx refills may take up to 3 business days. We ask that you follow-up with your pharmacy.

## 2023-12-23 NOTE — Telephone Encounter (Signed)
 Would continue, can place referral to urology for further evaluation and work up if he is interested.

## 2023-12-23 NOTE — Telephone Encounter (Signed)
 Controlled substance , contract dated 09/20/2023.  Last filled: 11/23/23   Next appt : 03/27/24

## 2023-12-23 NOTE — Telephone Encounter (Signed)
 Copied from CRM #[id number]. Topic: Clinical - Medical Advice >> Dec 23, 2023 11:03 AM Graeme ORN wrote: Reason for CRM: Been on tamsulosin  (FLOMAX ) 0.4 MG CAPS capsule for 3 months. No Change. About he same - not getting better. Do you want to continue and refill or do something else? Thank You

## 2024-01-24 ENCOUNTER — Other Ambulatory Visit: Payer: Self-pay | Admitting: Nurse Practitioner

## 2024-01-24 DIAGNOSIS — M542 Cervicalgia: Secondary | ICD-10-CM

## 2024-01-24 MED ORDER — HYDROCODONE-ACETAMINOPHEN 7.5-325 MG PO TABS: ORAL_TABLET | ORAL | 0 refills | Status: DC

## 2024-01-24 NOTE — Telephone Encounter (Unsigned)
 Copied from CRM [id number]. Topic: Clinical - Medication Refill >> Jan 24, 2024  9:31 AM Graeme ORN wrote: Medication: HYDROcodone -acetaminophen  (NORCO) 7.5-325 MG tablet  Has the patient contacted their pharmacy? No (Agent: If no, request that the patient contact the pharmacy for the refill. If patient does not wish to contact the pharmacy document the reason why and proceed with request.) (Agent: If yes, when and what did the pharmacy advise?) Controlled  This is the patient's preferred pharmacy:  [facility]ORE #[id number] [facility], [place] - [address] AT [facility] [address] [place] [address] Phone: [phone] Fax: [phone]  Is this the correct pharmacy for this prescription? Yes If no, delete pharmacy and type the correct one.   Has the prescription been filled recently? Yes 10/2  Is the patient out of the medication? Yes will be out this afternoon   Has the patient been seen for an appointment in the last year OR does the patient have an upcoming appointment? Yes  Can we respond through MyChart? Yes  Agent: Please be advised that Rx refills may take up to 3 business days. We ask that you follow-up with your pharmacy.

## 2024-01-24 NOTE — Telephone Encounter (Signed)
 Patient requested refill.  Epic LR: 12/23/2023 Contract Date: 09/20/2023  Pended Rx and sent to [facility] for approval.

## 2024-02-22 ENCOUNTER — Other Ambulatory Visit: Payer: Self-pay | Admitting: Nurse Practitioner

## 2024-02-22 DIAGNOSIS — M542 Cervicalgia: Secondary | ICD-10-CM

## 2024-02-22 NOTE — Telephone Encounter (Unsigned)
 Copied from CRM #[id number]. Topic: Clinical - Medication Refill >> Feb 22, 2024 10:33 AM Chiquita SQUIBB wrote: Medication: HYDROcodone -acetaminophen  HYDROcodone -acetaminophen  (NORCO) 7.5-325 MG tablet   Has the patient contacted their pharmacy? Yes (Agent: If no, request that the patient contact the pharmacy for the refill. If patient does not wish to contact the pharmacy document the reason why and proceed with request.) (Agent: If yes, when and what did the pharmacy advise?)  This is the patient's preferred pharmacy:  [facility]ORE #[id number] [facility] [place] - [address] AT [facility] [address] [place] [address] Phone: [phone] Fax: [phone]  Is this the correct pharmacy for this prescription? Yes If no, delete pharmacy and type the correct one.   Has the prescription been filled recently? No  Is the patient out of the medication? Yes  Has the patient been seen for an appointment in the last year OR does the patient have an upcoming appointment? Yes  Can we respond through MyChart? Yes  Agent: Please be advised that Rx refills may take up to 3 business days. We ask that you follow-up with your pharmacy.

## 2024-02-23 ENCOUNTER — Ambulatory Visit: Payer: Self-pay

## 2024-02-23 MED ORDER — HYDROCODONE-ACETAMINOPHEN 7.5-325 MG PO TABS: ORAL_TABLET | ORAL | 0 refills | Status: DC

## 2024-02-23 NOTE — Telephone Encounter (Signed)
 This RN attempted x1 to contact patient, no answer, left voicemail message.

## 2024-02-23 NOTE — Telephone Encounter (Signed)
 FYI Only or Action Required?: FYI only for provider: pt declines appt and will do home care.  Patient was last seen in primary care on 10/06/2023 by Sherlynn Madden, MD.  Called Nurse Triage reporting Diarrhea.  Symptoms began today.  Interventions attempted: Nothing.  Symptoms are: unchanged.  Triage Disposition: See PCP When Office is Open (Within 3 Days)  Patient/caregiver understands and will follow disposition?: No, wishes to speak with PCP  Reason for Disposition  [1] MILD diarrhea (e.g., 1-3 or more stools than normal in past 24 hours) AND [2] present >  7 days  (Exception: Chronic diarrhea that is not worse.)  Answer Assessment - Initial Assessment Questions Offered appt, patient declines and reports I am just going to stay home and care for self at home, at this time.  Advised call back or ED if symptoms worsen.  Encourage increase fluid, diet as tolerated/brat diet, hand hygiene  Patient denies neck stiffness, reports chronic neck pain, denies HA, fever, chills,n/v  1. DIARRHEA SEVERITY: How bad is the diarrhea? How many more stools have you had in the past 24 hours than normal?      X1, watery and loose, brown, last todyd  2. ONSET: When did the diarrhea begin?      today 3. STOOL DESCRIPTION:  How loose or watery is the diarrhea? What is the stool color? Is there any blood or mucous in the stool?     brown 4. VOMITING: Are you also vomiting? If Yes, ask: How many times in the past 24 hours?      no 5. ABDOMEN PAIN: Are you having any abdomen pain? If Yes, ask: What does it feel like? (e.g., crampy, dull, intermittent, constant)      no 6. ABDOMEN PAIN SEVERITY: If present, ask: How bad is the pain?  (e.g., Scale 1-10; mild, moderate, or severe)     no 7. ORAL INTAKE: If vomiting, Have you been able to drink liquids? How much liquids have you had in the past 24 hours?     Drinking plenty fluids, just decrease in appeitite 8.  HYDRATION: Any signs of dehydration? (e.g., dry mouth [not just dry lips], too weak to stand, dizziness, new weight loss) When did you last urinate?     Denies dizziness, weakness, reports normal urine output 9. EXPOSURE: Have you traveled to a foreign country recently? Have you been exposed to anyone with diarrhea? Could you have eaten any food that was spoiled?     no 10. ANTIBIOTIC USE: Are you taking antibiotics now or have you taken antibiotics in the past 2 months?       no 11. OTHER SYMPTOMS: Do you have any other symptoms? (e.g., fever, blood in stool)       Denies fever chills n/v Body aches  Protocols used: Diarrhea-A-AH

## 2024-02-23 NOTE — Telephone Encounter (Signed)
 Patient called again about refill.  Patient is requesting a refill of the following medications: Requested Prescriptions   Pending Prescriptions Disp Refills   HYDROcodone -acetaminophen  (NORCO) 7.5-325 MG tablet 150 tablet 0    Sig: Take one tablets by mouth Every 4 hours as needed pain (MAX 5 in 24 hours)    Date of last refill: 01/24/2024  Refill amount: 150  Treatment agreement date: June 2025     Please advise

## 2024-02-23 NOTE — Telephone Encounter (Signed)
 Message from Mercer PEDLAR sent at 02/23/2024  2:30 PM EST  Summary: Diarrhea   Reason for Triage: Patient is having diarrhea and feeling sick. He also stated that he is having bad neck pain spreading down to shoulders.

## 2024-03-02 ENCOUNTER — Telehealth: Payer: Self-pay

## 2024-03-02 NOTE — Telephone Encounter (Signed)
 Copied from CRM #[id number]. Topic: General - Other >> Mar 02, 2024  9:23 AM Marda MATSU wrote: Patient James [name] call he would like a to know if he had a Photographer and Flu shot this year ?    Please advise

## 2024-03-06 ENCOUNTER — Telehealth: Payer: Self-pay

## 2024-03-06 ENCOUNTER — Other Ambulatory Visit: Payer: Self-pay | Admitting: Nurse Practitioner

## 2024-03-06 DIAGNOSIS — M48061 Spinal stenosis, lumbar region without neurogenic claudication: Secondary | ICD-10-CM

## 2024-03-06 NOTE — Telephone Encounter (Signed)
 Mychart message sent to patient.

## 2024-03-06 NOTE — Telephone Encounter (Signed)
 Copied from CRM #[id number]. Topic: General - Other >> Mar 06, 2024 10:03 AM Miquel SAILOR wrote: Reason for CRM: PT needs call back on if he received pneumonia vaccine. [phone]

## 2024-03-13 ENCOUNTER — Telehealth: Payer: Self-pay

## 2024-03-13 NOTE — Telephone Encounter (Signed)
 James [name] gave me a form from [employer] regarding patient C-Pap ,reached out to patient and he informed me he's been on c-pap for years and there was a change in insurance ,but he has supplies and he do not need anything from us  at this time.

## 2024-03-22 ENCOUNTER — Other Ambulatory Visit: Payer: Self-pay | Admitting: Nurse Practitioner

## 2024-03-22 DIAGNOSIS — M542 Cervicalgia: Secondary | ICD-10-CM

## 2024-03-22 NOTE — Telephone Encounter (Unsigned)
 Copied from CRM #[id number]. Topic: Clinical - Medication Refill >> Mar 22, 2024 12:55 PM Susanna ORN wrote: Medication: HYDROcodone -acetaminophen  (NORCO) 7.5-325 MG tablet  Has the patient contacted their pharmacy? No (Agent: If no, request that the patient contact the pharmacy for the refill. If patient does not wish to contact the pharmacy document the reason why and proceed with request.) (Agent: If yes, when and what did the pharmacy advise?)  This is the patient's preferred pharmacy:  [facility]ORE #[id number] [facility], [place] - [address] AT [facility] [address] [place] [address] Phone: [phone] Fax: [phone]  Is this the correct pharmacy for this prescription? Yes If no, delete pharmacy and type the correct one.   Has the prescription been filled recently? Yes  Is the patient out of the medication? Yes  Has the patient been seen for an appointment in the last year OR does the patient have an upcoming appointment? Yes  Can we respond through MyChart? Yes  Agent: Please be advised that Rx refills may take up to 3 business days. We ask that you follow-up with your pharmacy.

## 2024-03-24 MED ORDER — HYDROCODONE-ACETAMINOPHEN 7.5-325 MG PO TABS: ORAL_TABLET | ORAL | 0 refills | Status: DC

## 2024-03-24 NOTE — Telephone Encounter (Signed)
 Patient requested refill.  Epic LR: 02/23/2024 Contract Date: 09/20/2023  Pended Rx and sent to [facility] for approval.

## 2024-03-27 ENCOUNTER — Ambulatory Visit: Payer: Self-pay | Admitting: Nurse Practitioner

## 2024-03-27 ENCOUNTER — Encounter: Payer: Self-pay | Admitting: Nurse Practitioner

## 2024-03-27 ENCOUNTER — Telehealth: Payer: Self-pay | Admitting: Neurology

## 2024-03-27 VITALS — BP 116/78 | HR 70 | Temp 96.6°F | Resp 18 | Ht 72.0 in | Wt 199.8 lb

## 2024-03-27 DIAGNOSIS — E785 Hyperlipidemia, unspecified: Secondary | ICD-10-CM

## 2024-03-27 DIAGNOSIS — N401 Enlarged prostate with lower urinary tract symptoms: Secondary | ICD-10-CM | POA: Diagnosis not present

## 2024-03-27 DIAGNOSIS — F411 Generalized anxiety disorder: Secondary | ICD-10-CM

## 2024-03-27 DIAGNOSIS — M542 Cervicalgia: Secondary | ICD-10-CM

## 2024-03-27 DIAGNOSIS — R3912 Poor urinary stream: Secondary | ICD-10-CM | POA: Diagnosis not present

## 2024-03-27 DIAGNOSIS — F1120 Opioid dependence, uncomplicated: Secondary | ICD-10-CM

## 2024-03-27 MED ORDER — FINASTERIDE 5 MG PO TABS: 5.0000 mg | ORAL_TABLET | Freq: Every day | ORAL | 1 refills | Status: AC

## 2024-03-27 NOTE — Telephone Encounter (Signed)
 Request to reschedule appointment due to conflict

## 2024-03-27 NOTE — Progress Notes (Signed)
 "   Careteam: Patient Care Team: Caro Harlene POUR, NP as PCP - General (Geriatric Medicine) Melodi Lerner, MD as Consulting Physician (Orthopedic Surgery[facility] Camelia Dianna Specking, MD as Consulting Physician (Gastroenterology)  PLACE OF SERVICE:  [facility]  Advanced Directive information    Allergies[1]  Chief Complaint  Patient presents with   Medical Management of Chronic Issues    6 months follow-up, needs colonoscpy and medicare annual wellness visit    HPI:  Discussed the use of AI scribe software for clinical note transcription with the patient, who gave verbal consent to proceed.  History of Present Illness James [name] is a 64 year old male who presents for a six-month follow-up visit.  He experiences urinary hesitancy with a 'hesitant start' to urination, where he begins to urinate and then has to wait before the flow becomes normal. Flomax  has not been effective in addressing this issue. He has not seen a urologist since his twenties when he had a kidney stone.  He had a severe reaction to Crestor , which included wheezing and panic after taking half a dose, leading to discontinuation of the medication. He is currently monitoring his cholesterol levels and has made dietary changes, such as switching to almond milk and avoiding bacon, to manage his cholesterol.  He experiences intermittent shoulder pain, described as feeling like the tendon might 'fall away.' The pain is not localized to one spot and has been accompanied by weight gain in the shoulder area. The pain has improved but can still be felt with certain movements.  He continues to take hydrocodone  7.5/325 mg once every four hours as needed, with a maximum of five doses in 24 hours. His pain is currently at a manageable level of 4-5 out of 10, primarily in his neck. He no longer experiences low back pain following hip surgery.  He is involved in an [employer] program and has been taking more exercise,  such as walking with a friend, and engaging in activities like copy.   Reports normal bowel movements- reports he is up to date with colonoscopy- will request records.     Review of Systems:  Review of Systems  Constitutional:  Negative for chills, fever and weight loss.  HENT:  Negative for tinnitus.   Respiratory:  Negative for cough, sputum production and shortness of breath.   Cardiovascular:  Negative for chest pain, palpitations and leg swelling.  Gastrointestinal:  Negative for abdominal pain, constipation, diarrhea and heartburn.  Genitourinary:  Negative for dysuria, frequency and urgency.  Musculoskeletal:  Positive for back pain, myalgias and neck pain. Negative for falls and joint pain.  Skin: Negative.   Neurological:  Negative for dizziness and headaches.  Psychiatric/Behavioral:  Negative for depression and memory loss. The patient does not have insomnia.     Past Medical History:  Diagnosis Date   Anxiety    Arthritis    Asthma    chldhood asthma   BPH (benign prostatic hyperplasia)    Cervicalgia    degenerative changes and foraminal stenoses   Chest pain, unspecified    Depression    Family history of colonic polyps    Father with benign colon polyp   Hepatitis B antibody positive 1984   Hiatal hernia 09/19/2010   History of kidney stones    Hyperglycemia    104 on 10/10/10   Hyperlipidemia    Obstructive sleep apnea (adult) (pediatric)    using CPAP   Other malaise and fatigue  Sinus infection    diagnosed on 05/30/22 by cpcp - tx with amoxicillin and prednisone    Supraventricular premature beats    noted on EKG 05/18/12   Wrist pain, left    Past Surgical History:  Procedure Laterality Date   COLON SURGERY  09/17/2008   poylps/hemorriods   colonoscocpy      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 His smoking use included cigarettes. He has never used smokeless tobacco. He reports that he does not drink alcohol and does not use drugs.  Family History  Problem Relation Age of Onset   Heart disease Mother    Hypertension Mother    Diabetes Mother    Depression Mother    Cancer Father        2004 bladder cancer   Alcohol abuse Father    Heart Problems Sister        Visual merchandiser insert    Medications: Patient's Medications  New Prescriptions   No medications on file  Previous Medications   ALBUTEROL  (VENTOLIN  HFA) 108 (90 BASE) MCG/ACT INHALER    INHALE 2 PUFFS INTO THE LUNGS EVERY 6 HOURS AS NEEDED FOR WHEEZING OR SHORTNESS OF BREATH   ASCORBIC ACID (VITAMIN C PO)    Take 1 tablet by mouth daily.   B COMPLEX VITAMINS CAPSULE    Take 1 capsule by mouth every other day.   CALCIUM  PO    Take 1 tablet by mouth daily.   DULOXETINE  (CYMBALTA ) 60 MG CAPSULE    TAKE 1 CAPSULE(60 MG) BY MOUTH DAILY   EPINEPHRINE  0.3 MG/0.3 ML IJ SOAJ INJECTION    Inject 0.3ml into the muscle as needed for allergic reaction   HYDROCODONE -ACETAMINOPHEN  (NORCO) 7.5-325 MG TABLET    Take one tablets by mouth Every 4 hours as needed pain (MAX 5 in 24 hours)   LATANOPROST  (XALATAN ) 0.005 % OPHTHALMIC SOLUTION    Place 1 drop into both eyes daily.   MAGNESIUM GLYCINATE PO    Take 1 tablet by mouth every other day.   MULTIPLE VITAMIN (MULTIVITAMIN) TABLET    Take 1 tablet by mouth daily.   TAMSULOSIN  (FLOMAX ) 0.4 MG CAPS CAPSULE    Take 1 capsule (0.4 mg total) by mouth daily.  Modified Medications   No medications on file  Discontinued Medications   No medications on file    Physical Exam:  Vitals:   03/27/24 0811  BP: 116/78  Pulse: 70  Resp: 18  Temp: (!) 96.6 F (35.9 C)  SpO2: 96%  Weight: 199 lb 12.8 oz (90.6 kg)  Height: 6' (1.829 m)   Body mass index is 27.1 kg/m. Wt Readings from Last 3  Encounters:  03/27/24 199 lb 12.8 oz (90.6 kg)  10/06/23 201 lb 1.6 oz (91.2 kg)  09/20/23 202 lb 12.8 oz (92 kg)    Physical Exam Constitutional:      General: He is not in acute distress.    Appearance: He is well-developed. He is not diaphoretic.  HENT:     Head: Normocephalic and atraumatic.     Right Ear: External ear normal.     Left Ear: External ear normal.     Mouth/Throat:     Pharynx: No oropharyngeal exudate.  Eyes:  Conjunctiva/sclera: Conjunctivae normal.     Pupils: Pupils are equal, round, and reactive to light.  Cardiovascular:     Rate and Rhythm: Normal rate and regular rhythm.     Heart sounds: Normal heart sounds.  Pulmonary:     Effort: Pulmonary effort is normal.     Breath sounds: Normal breath sounds.  Abdominal:     General: Bowel sounds are normal.     Palpations: Abdomen is soft.  Musculoskeletal:        General: No tenderness.     Cervical back: Normal range of motion and neck supple.     Right lower leg: No edema.     Left lower leg: No edema.  Skin:    General: Skin is warm and dry.  Neurological:     Mental Status: He is alert and oriented to person, place, and time.     Labs reviewed: Basic Metabolic Panel: Recent Labs    09/20/23 1053  NA 137  K 4.6  CL 105  CO2 23  GLUCOSE 93  BUN 21  CREATININE 0.88  CALCIUM  9.5   Liver Function Tests: Recent Labs    09/20/23 1053  AST 20  ALT 18  BILITOT 0.6  PROT 6.9   No results for input(s): LIPASE, AMYLASE in the last 8760 hours. No results for input(s): AMMONIA in the last 8760 hours. CBC: Recent Labs    09/20/23 1053  WBC 4.6  NEUTROABS 2,171  HGB 13.6  HCT 43.1  MCV 87.2  PLT 203   Lipid Panel: Recent Labs    09/20/23 1053  CHOL 260*  HDL 76  LDLCALC 162*  TRIG 108  CHOLHDL 3.4   TSH: No results for input(s): TSH in the last 8760 hours. A1C: Lab Results  Component Value Date   HGBA1C 5.5 02/01/2020     Assessment/Plan  Assessment &  Plan Benign prostatic hyperplasia with lower urinary tract symptoms Experiencing hesitancy in urination. Concern about PSA elevation. - Added finasteride  to current Flomax  regimen. - Referred to urology for further evaluation and possible urodynamic studies. - Will recheck PSA levels.  Hyperlipidemia Previous adverse reaction to Crestor . Managing diet by eliminating bacon and using almond milk. - Ordered fasting lipid panel. - Monitor cholesterol levels and consider alternative lipid-lowering therapy if levels are concerning.  Cervicalgia Chronic neck pain managed with hydrocodone . Pain level 4-5 out of 10, manageable. - Continue current hydrocodone  regimen.  Opioid dependence Managed with hydrocodone , taken judiciously. Pain level 4-5 out of 10, manageable. - Continue current hydrocodone  regimen with emphasis on judicious use.  Anxiety Controlled on cymbalta .   General Health Maintenance Annual wellness visit is due. Prefers virtual visit. - Schedule annual wellness visit virtually this month.    Return in about 6 months (around 09/24/2024) for routine follow up.  Miyah Hampshire K. Caro BODILY [facility] [phone]     [1]  Allergies Allergen Reactions   Bee Venom Anaphylaxis   Codeine Nausea And Vomiting   Skelaxin [Metaxalone] Other (See Comments)    Lethargic    Yellow Jacket Venom Anaphylaxis   Augmentin [Amoxicillin-Pot Clavulanate] Nausea And Vomiting   Rosuvastatin      Pt c/o flu like symptoms with muscle aches, sob    Sedapap [Butalbital-Acetaminophen ] Other (See Comments)    Unknown reaction    Wellbutrin [Bupropion]     Altered mental state   "

## 2024-03-27 NOTE — Patient Instructions (Signed)
 Our records indicate you are due for an annual wellness visit, stop at check out to schedule (video or in-person).

## 2024-03-28 ENCOUNTER — Ambulatory Visit: Payer: Self-pay | Admitting: Nurse Practitioner

## 2024-03-28 ENCOUNTER — Encounter: Payer: Self-pay | Admitting: Nurse Practitioner

## 2024-03-28 LAB — DM TEMPLATE

## 2024-03-30 ENCOUNTER — Ambulatory Visit: Payer: 59 | Admitting: Neurology

## 2024-03-30 LAB — DRUG MONITORING, PANEL 6 WITH CONFIRMATION, URINE
6 Acetylmorphine: NEGATIVE ng/mL
Alcohol Metabolites: NEGATIVE ng/mL
Amphetamines: NEGATIVE ng/mL
Barbiturates: NEGATIVE ng/mL
Benzodiazepines: NEGATIVE ng/mL
Cocaine Metabolite: NEGATIVE ng/mL
Codeine: NEGATIVE ng/mL
Creatinine: 87.9 mg/dL
Hydrocodone: 125 ng/mL — ABNORMAL HIGH
Hydromorphone: NEGATIVE ng/mL
Marijuana Metabolite: NEGATIVE ng/mL
Methadone Metabolite: NEGATIVE ng/mL
Morphine: NEGATIVE ng/mL
Norhydrocodone: 349 ng/mL — ABNORMAL HIGH
Opiates: POSITIVE ng/mL — AB
Oxidant: NEGATIVE ug/mL
Oxycodone: NEGATIVE ng/mL
Phencyclidine: NEGATIVE ng/mL
pH: 7.1 (ref 4.5–9.0)

## 2024-03-30 LAB — COMPREHENSIVE METABOLIC PANEL WITH GFR
AG Ratio: 1.9 (calc) (ref 1.0–2.5)
ALT: 15 U/L (ref 9–46)
AST: 17 U/L (ref 10–35)
Albumin: 4.5 g/dL (ref 3.6–5.1)
Alkaline phosphatase (APISO): 52 U/L (ref 35–144)
BUN: 19 mg/dL (ref 7–25)
CO2: 24 mmol/L (ref 20–32)
Calcium: 9.4 mg/dL (ref 8.6–10.3)
Chloride: 105 mmol/L (ref 98–110)
Creat: 0.92 mg/dL (ref 0.70–1.35)
Globulin: 2.4 g/dL (ref 1.9–3.7)
Glucose, Bld: 103 mg/dL — ABNORMAL HIGH (ref 65–99)
Potassium: 4.5 mmol/L (ref 3.5–5.3)
Sodium: 139 mmol/L (ref 135–146)
Total Bilirubin: 0.6 mg/dL (ref 0.2–1.2)
Total Protein: 6.9 g/dL (ref 6.1–8.1)
eGFR: 92 mL/min/1.73m2

## 2024-03-30 LAB — CBC WITH DIFFERENTIAL/PLATELET
Absolute Lymphocytes: 2063 {cells}/uL (ref 850–3900)
Absolute Monocytes: 440 {cells}/uL (ref 200–950)
Basophils Absolute: 50 {cells}/uL (ref 0–200)
Basophils Relative: 0.9 %
Eosinophils Absolute: 325 {cells}/uL (ref 15–500)
Eosinophils Relative: 5.9 %
HCT: 44.3 % (ref 39.4–51.1)
Hemoglobin: 14.2 g/dL (ref 13.2–17.1)
MCH: 27.8 pg (ref 27.0–33.0)
MCHC: 32.1 g/dL (ref 31.6–35.4)
MCV: 86.9 fL (ref 81.4–101.7)
MPV: 11.5 fL (ref 7.5–12.5)
Monocytes Relative: 8 %
Neutro Abs: 2624 {cells}/uL (ref 1500–7800)
Neutrophils Relative %: 47.7 %
Platelets: 209 Thousand/uL (ref 140–400)
RBC: 5.1 Million/uL (ref 4.20–5.80)
RDW: 13.1 % (ref 11.0–15.0)
Total Lymphocyte: 37.5 %
WBC: 5.5 Thousand/uL (ref 3.8–10.8)

## 2024-03-30 LAB — LIPID PANEL
Cholesterol: 259 mg/dL — ABNORMAL HIGH
HDL: 76 mg/dL
LDL Cholesterol (Calc): 159 mg/dL — ABNORMAL HIGH
Non-HDL Cholesterol (Calc): 183 mg/dL — ABNORMAL HIGH
Total CHOL/HDL Ratio: 3.4 (calc)
Triglycerides: 118 mg/dL

## 2024-03-30 LAB — DM TEMPLATE

## 2024-03-30 LAB — PSA: PSA: 1.22 ng/mL

## 2024-04-06 ENCOUNTER — Ambulatory Visit: Admitting: Nurse Practitioner

## 2024-04-06 ENCOUNTER — Encounter: Payer: Self-pay | Admitting: Nurse Practitioner

## 2024-04-06 DIAGNOSIS — Z0000 Encounter for general adult medical examination without abnormal findings: Secondary | ICD-10-CM

## 2024-04-06 NOTE — Progress Notes (Signed)
 "  Chief Complaint  Patient presents with   Medicare Wellness    AWV     Subjective:   James [name] is a 64 y.o. male who presents for a [employer] Visit.  Visit info / Clinical Intake: Medicare Wellness Visit Type:: Subsequent Annual Wellness Visit Persons participating in visit and providing information:: patient Medicare Wellness Visit Mode:: Video Since this visit was completed virtually, some vitals may be partially provided or unavailable. Missing vitals are due to the limitations of the virtual format.: Unable to obtain vitals - no equipment If Telephone or Video please confirm:: I connected with patient using audio/video enable telemedicine. I verified patient identity with two identifiers, discussed telehealth limitations, and patient agreed to proceed. Patient Location:: Home Provider Location:: Office [facility] Interpreter Needed?: No Pre-visit prep was completed: no AWV questionnaire completed by patient prior to visit?: no Living arrangements:: lives with spouse/significant other Patient's Overall Health Status Rating: excellent Typical amount of pain: some Does pain affect daily life?: (!) yes Are you currently prescribed opioids?: (!) yes  Dietary Habits and Nutritional Risks How many meals a day?: 3 Eats fruit and vegetables daily?: yes Most meals are obtained by: preparing own meals In the last 2 weeks, have you had any of the following?: none Diabetic:: no  Functional Status Activities of Daily Living (to include ambulation/medication): Independent Ambulation: Independent Medication Administration: Independent Home Management (perform basic housework or laundry): Independent Manage your own finances?: yes Primary transportation is: driving Concerns about vision?: no *vision screening is required for WTM* Concerns about hearing?: no  Fall Screening Falls in the past year?: 0 Number of falls in past year: 0 Was there an injury with  Fall?: 0 Fall Risk Category Calculator: 0 Patient Fall Risk Level: Low Fall Risk  Fall Risk Patient at Risk for Falls Due to: No Fall Risks Fall risk Follow up: Falls evaluation completed  Cognitive Assessment Difficulty concentrating, remembering, or making decisions? : no Will 6CIT or Mini Cog be Completed: yes What year is it?: 0 points What month is it?: 0 points Give patient an address phrase to remember (5 components): [address] [place]  About what time is it?: 0 points Count backwards from 20 to 1: 0 points Say the months of the year in reverse: 0 points Repeat the address phrase from earlier: 0 points 6 CIT Score: 0 points  Advance Directives (For Healthcare) Does Patient Have a Medical Advance Directive?: Yes Does patient want to make changes to medical advance directive?: No - Patient declined Type of Advance Directive: Healthcare Power of [place]; Living will Copy of Healthcare Power of Attorney in Chart?: Yes - validated most recent copy scanned in chart (See row information) Copy of Living Will in Chart?: Yes - validated most recent copy scanned in chart (See row information)  Reviewed/Updated  Reviewed/Updated: Reviewed All (Medical, Surgical, Family, Medications, Allergies, Care Teams, Patient Goals)    Allergies (verified) Bee venom, Codeine, Skelaxin [metaxalone], Yellow jacket venom, Augmentin [amoxicillin-pot clavulanate], Rosuvastatin , Sedapap [butalbital-acetaminophen ], and Wellbutrin [bupropion]   Current Medications (verified) Outpatient Encounter Medications as of 04/06/2024  Medication Sig   albuterol  (VENTOLIN  HFA) 108 (90 Base) MCG/ACT inhaler INHALE 2 PUFFS INTO THE LUNGS EVERY 6 HOURS AS NEEDED FOR WHEEZING OR SHORTNESS OF BREATH   Ascorbic Acid (VITAMIN C PO) Take 1 tablet by mouth daily.   b complex vitamins capsule Take 1 capsule by mouth every other day.   CALCIUM  PO Take 1 tablet by mouth daily.  DULoxetine  (CYMBALTA ) 60 MG  capsule TAKE 1 CAPSULE(60 MG) BY MOUTH DAILY   EPINEPHrine  0.3 mg/0.3 mL IJ SOAJ injection Inject 0.62ml into the muscle as needed for allergic reaction   finasteride  (PROSCAR ) 5 MG tablet Take 1 tablet (5 mg total) by mouth daily.   HYDROcodone -acetaminophen  (NORCO) 7.5-325 MG tablet Take one tablets by mouth Every 4 hours as needed pain (MAX 5 in 24 hours)   latanoprost  (XALATAN ) 0.005 % ophthalmic solution Place 1 drop into both eyes daily.   MAGNESIUM GLYCINATE PO Take 1 tablet by mouth every other day.   Multiple Vitamin (MULTIVITAMIN) tablet Take 1 tablet by mouth daily.   tamsulosin  (FLOMAX ) 0.4 MG CAPS capsule Take 1 capsule (0.4 mg total) by mouth daily.   No facility-administered encounter medications on file as of 04/06/2024.    History: Past Medical History:  Diagnosis Date   Anxiety    Arthritis    Asthma    chldhood asthma   BPH (benign prostatic hyperplasia)    Cervicalgia    degenerative changes and foraminal stenoses   Chest pain, unspecified    Depression    Family history of colonic polyps    Father with benign colon polyp   Hepatitis B antibody positive 1984   Hiatal hernia 09/19/2010   History of kidney stones    Hyperglycemia    104 on 10/10/10   Hyperlipidemia    Obstructive sleep apnea (adult) (pediatric)    using CPAP   Other malaise and fatigue    Sinus infection    diagnosed on 05/30/22 by [facility] - tx with amoxicillin and prednisone    Supraventricular premature beats    noted on EKG 05/18/12   Wrist pain, left    Past Surgical History:  Procedure Laterality Date   COLON SURGERY  09/17/2008   poylps/hemorriods   colonoscocpy      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   Procedure: TOTAL HIP ARTHROPLASTY ANTERIOR APPROACH;  Surgeon: Melodi Lerner, MD;  Location: [facility];  Service: Orthopedics;  Laterality: Left;   WISDOM TOOTH EXTRACTION  03/23/2009   Family History  Problem Relation Age of Onset   Heart disease Mother    Hypertension Mother     Diabetes Mother    Depression Mother    Cancer Father        2004 bladder cancer   Alcohol abuse Father    Heart Problems Sister        Visual merchandiser insert   Social History   Occupational History   Occupation: Marine Scientist: [employer]  Tobacco Use   Smoking status: Former    Types: Cigarettes   Smokeless tobacco: Never   Tobacco comments:    Quit 25 years ago as of 2024  Vaping Use   Vaping status: Never Used  Substance and Sexual Activity   Alcohol use: Never   Drug use: No   Sexual activity: Not on file   Tobacco Counseling Counseling given: Not Answered Tobacco comments: Quit 25 years ago as of 2024  SDOH Screenings   Food Insecurity: No Food Insecurity (04/06/2024)  Housing: Unknown (04/06/2024)  Transportation Needs: No Transportation Needs (06/24/2022)  Utilities: Not At Risk (04/06/2024)  Depression (PHQ2-9): Low Risk (03/27/2024)  Social Connections: Unknown (07/27/2021)   Received from [facility]  Tobacco Use: Medium Risk (04/06/2024)   See flowsheets for full screening details  Depression Screen PHQ 2 & 9 Depression Scale- Over the past 2 weeks, how often have you been  bothered by any of the following problems? Little interest or pleasure in doing things: 0 Feeling down, depressed, or hopeless (PHQ Adolescent also includes...irritable): 0 PHQ-2 Total Score: 0 Trouble falling or staying asleep, or sleeping too much: 0 Feeling tired or having little energy: 0 Poor appetite or overeating (PHQ Adolescent also includes...weight loss): 0 Feeling bad about yourself - or that you are a failure or have let yourself or your family down: 0 Trouble concentrating on things, such as reading the newspaper or watching television (PHQ Adolescent also includes...like school work): 0 Moving or speaking so slowly that other people could have noticed. Or the opposite - being so fidgety or restless that you have been moving around a lot more than usual: 0 Thoughts that you  would be better off dead, or of hurting yourself in some way: 0 PHQ-9 Total Score: 0     Goals Addressed   None          Objective:    There were no vitals filed for this visit. There is no height or weight on file to calculate BMI.  Hearing/Vision screen Vision Screening - Comments:: Dr Dorothyann Tsamis Has appt on Monday Immunizations and Health Maintenance Health Maintenance  Topic Date Due   Colonoscopy  06/05/2023   COVID-19 Vaccine (10 - Pfizer risk 2025-26 season) 07/25/2024   Medicare Annual Wellness (AWV)  04/06/2025   DTaP/Tdap/Td (4 - Td or Tdap) 12/17/2030   Pneumococcal Vaccine: 50+ Years  Completed   Influenza Vaccine  Completed   Hepatitis C Screening  Completed   HIV Screening  Completed   Zoster Vaccines- Shingrix  Completed   Hepatitis B Vaccines 19-59 Average Risk  Aged Out   Meningococcal B Vaccine  Aged Out        Assessment/Plan:  This is a routine wellness examination for James [name].  Patient Care Team: Caro Harlene POUR, NP as PCP - General (Geriatric Medicine) Melodi Lerner, MD as Consulting Physician (Orthopedic [facility] [employer] Camelia Sol, Jerrell, MD as Consulting Physician (Gastroenterology)  I have personally reviewed and noted the following in the patients chart:   Medical and social history Use of alcohol, tobacco or illicit drugs  Current medications and supplements including opioid prescriptions. Functional ability and status Nutritional status Physical activity Advanced directives List of other physicians Hospitalizations, surgeries, and ER visits in previous 12 months Vitals Screenings to include cognitive, depression, and falls Referrals and appointments  No orders of the defined types were placed in this encounter.  In addition, I have reviewed and discussed with patient certain preventive protocols, quality metrics, and best practice recommendations. A written personalized care plan for preventive services as  well as general preventive health recommendations were provided to patient.   Harlene POUR Caro, NP   04/06/2024   Return in 1 year (on 04/06/2025).  After Visit Summary: (MyChart) Due to this being a telephonic visit, the after visit summary with patients personalized plan was offered to patient via MyChart   "

## 2024-04-06 NOTE — Progress Notes (Signed)
"  °  This service is provided via telemedicine  No vital signs collected/recorded due to the encounter was a telemedicine visit.   Location of patient (ex: home, work):  Home  Patient consents to a telephone visit:  Yes  Location of the provider (ex: office, home):  Office [place].   Name of any referring provider:  na  Names of all persons participating in the telemedicine service and their role in the encounter:  Dena Panther, Patient, Donzell Beal, CMA, Harlene An, NP  Time spent on call:  7:11  "

## 2024-04-06 NOTE — Patient Instructions (Signed)
 Mr. Lengacher,  Thank you for taking the time for your Medicare Wellness Visit. I appreciate your continued commitment to your health goals. Please review the care plan we discussed, and feel free to reach out if I can assist you further.  Please note that Annual Wellness Visits do not include a physical exam. Some assessments may be limited, especially if the visit was conducted virtually. If needed, we may recommend an in-person follow-up with your provider.  Ongoing Care Seeing your primary care provider every 3 to 6 months helps us  monitor your health and provide consistent, personalized care.   Referrals If a referral was made during today's visit and you haven't received any updates within two weeks, please contact the referred provider directly to check on the status.  Recommended Screenings:  Health Maintenance  Topic Date Due   Medicare Annual Wellness Visit  Never done   Colon Cancer Screening  06/05/2023   COVID-19 Vaccine (10 - Pfizer risk 2025-26 season) 07/25/2024   DTaP/Tdap/Td vaccine (4 - Td or Tdap) 12/17/2030   Pneumococcal Vaccine for age over 72  Completed   Flu Shot  Completed   Hepatitis C Screening  Completed   HIV Screening  Completed   Zoster (Shingles) Vaccine  Completed   Hepatitis B Vaccine  Aged Out   Meningitis B Vaccine  Aged Out       04/06/2024    8:04 AM  Advanced Directives  Does Patient Have a Medical Advance Directive? Yes  Type of Estate Agent of [place];Living will  Does patient want to make changes to medical advance directive? No - Patient declined  Copy of Healthcare Power of Attorney in Chart? Yes - validated most recent copy scanned in chart (See row information)    Vision: Annual vision screenings are recommended for early detection of glaucoma, cataracts, and diabetic retinopathy. These exams can also reveal signs of chronic conditions such as diabetes and high blood pressure.  Dental: Annual dental  screenings help detect early signs of oral cancer, gum disease, and other conditions linked to overall health, including heart disease and diabetes.  Please see the attached documents for additional preventive care recommendations.

## 2024-04-25 ENCOUNTER — Other Ambulatory Visit: Payer: Self-pay

## 2024-04-25 DIAGNOSIS — M542 Cervicalgia: Secondary | ICD-10-CM

## 2024-04-25 MED ORDER — HYDROCODONE-ACETAMINOPHEN 7.5-325 MG PO TABS: ORAL_TABLET | ORAL | 0 refills | Status: AC

## 2024-05-02 ENCOUNTER — Ambulatory Visit: Admitting: Neurology

## 2024-09-25 ENCOUNTER — Ambulatory Visit: Admitting: Nurse Practitioner

## 2025-04-09 ENCOUNTER — Ambulatory Visit: Admitting: Nurse Practitioner
